# Patient Record
Sex: Male | Born: 1945 | Race: White | Hispanic: No | Marital: Married | Smoking: Former smoker
Health system: Southern US, Community
[De-identification: ages and names within clinical notes are randomized; demographics above are authoritative.]

## PROBLEM LIST (undated history)

## (undated) DIAGNOSIS — K469 Unspecified abdominal hernia without obstruction or gangrene: Secondary | ICD-10-CM

## (undated) DIAGNOSIS — E785 Hyperlipidemia, unspecified: Secondary | ICD-10-CM

## (undated) DIAGNOSIS — G4733 Obstructive sleep apnea (adult) (pediatric): Secondary | ICD-10-CM

## (undated) DIAGNOSIS — M1990 Unspecified osteoarthritis, unspecified site: Secondary | ICD-10-CM

## (undated) DIAGNOSIS — I10 Essential (primary) hypertension: Secondary | ICD-10-CM

## (undated) DIAGNOSIS — I252 Old myocardial infarction: Secondary | ICD-10-CM

## (undated) DIAGNOSIS — I2510 Atherosclerotic heart disease of native coronary artery without angina pectoris: Secondary | ICD-10-CM

## (undated) DIAGNOSIS — K219 Gastro-esophageal reflux disease without esophagitis: Secondary | ICD-10-CM

## (undated) DIAGNOSIS — J449 Chronic obstructive pulmonary disease, unspecified: Secondary | ICD-10-CM

## (undated) DIAGNOSIS — I4891 Unspecified atrial fibrillation: Secondary | ICD-10-CM

## (undated) DIAGNOSIS — Z9989 Dependence on other enabling machines and devices: Secondary | ICD-10-CM

## (undated) HISTORY — PX: CATARACT EXTRACTION: SUR2

## (undated) HISTORY — DX: Gastro-esophageal reflux disease without esophagitis: K21.9

## (undated) HISTORY — DX: Unspecified abdominal hernia without obstruction or gangrene: K46.9

## (undated) HISTORY — DX: Essential (primary) hypertension: I10

## (undated) HISTORY — DX: Hyperlipidemia, unspecified: E78.5

## (undated) HISTORY — DX: Unspecified osteoarthritis, unspecified site: M19.90

## (undated) HISTORY — PX: CARDIAC CATHETERIZATION: SHX172

---

## 1981-01-23 DIAGNOSIS — I252 Old myocardial infarction: Secondary | ICD-10-CM

## 1981-01-23 HISTORY — DX: Old myocardial infarction: I25.2

## 1991-01-24 HISTORY — PX: ANGIOPLASTY: SHX39

## 1994-01-23 HISTORY — PX: CORONARY ARTERY BYPASS GRAFT: SHX141

## 2000-05-02 ENCOUNTER — Encounter: Payer: Self-pay | Admitting: *Deleted

## 2000-05-02 ENCOUNTER — Inpatient Hospital Stay (HOSPITAL_COMMUNITY): Admission: EM | Admit: 2000-05-02 | Discharge: 2000-05-04 | Payer: Self-pay | Admitting: Emergency Medicine

## 2006-03-16 ENCOUNTER — Ambulatory Visit: Payer: Self-pay | Admitting: Gastroenterology

## 2006-03-30 ENCOUNTER — Ambulatory Visit: Payer: Self-pay | Admitting: Gastroenterology

## 2010-08-08 ENCOUNTER — Encounter (INDEPENDENT_AMBULATORY_CARE_PROVIDER_SITE_OTHER): Payer: Self-pay | Admitting: General Surgery

## 2010-08-08 ENCOUNTER — Ambulatory Visit (INDEPENDENT_AMBULATORY_CARE_PROVIDER_SITE_OTHER): Payer: Medicare Other | Admitting: General Surgery

## 2010-08-08 VITALS — BP 158/86 | HR 60 | Temp 97.5°F | Ht 74.0 in | Wt 278.0 lb

## 2010-08-08 DIAGNOSIS — K429 Umbilical hernia without obstruction or gangrene: Secondary | ICD-10-CM | POA: Insufficient documentation

## 2010-08-08 NOTE — Progress Notes (Signed)
Subjective:     Patient ID: Nicholas Lynn, male   DOB: 1945/04/02, 65 y.o.   MRN: [id number]  HPI Comments: This is a 65 year old male referred by Dr. Duanne Guess for evaluation of an umbilical hernia. He states the hernia has been there for approximately 50 years. It is asymptomatic. It does not change size. He had some trauma to the area by way of an examination bleeding to bruising. Positive for about 6 months after that but has been asymptomatic since. He has no obstructive symptoms.    Review of Systems General:  _X_Normal __Fever__Weight change __Night sweats __Fatigue __Sleep loss  Breast:  _X_Normal __Lump __Pain __Nipple discharge __Infection __Other  Infectious Diseases:  _X_Normal __HIV/AIDS __Tuberculosis __Hepatitis A       __ Hepatitis B __Hepatitis C __ STD __MRSA(staph infection) __Other  Dental:  _X_Normal __Dentures __Other  Cardiac  :__ Normal __Pacemaker __Defibrillator _X_Bypass surgery _X_High blood pressure __ Peripheral vascular disease _X_Heart attack __Irregular heart beat __Valve       disease  Pulmonary:  _X_Normal __Cough/Sputum __Bronchitis __Asthma __COPD                    __Short of breath __Pneumonia __Sleep Apnea  Endocrine:  __Normal __Diabetes __Thyroid disease _X_High Cholesterol __Other  Skin:  _X_Normal  __Rash __Bruise easily __Cancer __Abnormal moles __Other  Gastrointestinal:  _X_Normal __Nausea/Vomiting/Diarrhea __Colon polyp or Cancer       __Irritable Bowel disease __Poor appetite __Hiatal hernia or reflux __Ulcer       __Liver disease __Abdominal pain __Hernia __Rectal bleeding or hemorrhoids       __Other  Genitourinary:  _X_Normal __Kidney disease or stones __Prostate problems        __Blood in urine __Difficulty voiding __Incontinence/leakage __Other  Neurological:  _X_Normal __Stroke __Paralysis __Seizure __Alzheimers disease       __Headaches __Dizziness __Fainting __Weakness __Other  Hematologic/Lymphatic:  _X_Normal __Bleeding  disorder __Blood clots __Anemia       __Swollen lymph nodes __Blood Transfusion __Other  Immune System:  _X_Normal __Previous/Current Cancer-type:  __Other  HEENT:  __Normal _X_Hearing loss  __Hearing aid __Ear infection __Nose bleed       __Hoarseness __Sore throat __Blurred or double vision _X_Glasses or contacts       __Glaucoma __Retinopathy __Macular degeneration __Other  Musculoskeletal:  _X_Normal __Joint pain __Arthritis __Other           Objective:   Physical Exam  Constitutional: No distress.       Obese.  Cardiovascular: Normal rate and regular rhythm.   No murmur heard. Pulmonary/Chest: Breath sounds normal.       midsternal scar.  Abdominal: Soft. Bowel sounds are normal. He exhibits no distension and no mass. There is no tenderness.       Partially reducible small umbilical hernia.  Genitourinary:       No inguinal hernias.       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 It is not getting larger.    Plan:        We discussed expectant management or repair of the hernia.  He is not interested in having surgery at this time. I told him that if he started to get the least bit of pain from the area or it started getting larger he should come back so we can schedule him for surgery. He seems to understand this and agrees. I did discuss the surgery including the procedure and risks with him and his wife.

## 2011-02-13 ENCOUNTER — Encounter: Payer: Self-pay | Admitting: Gastroenterology

## 2011-02-28 ENCOUNTER — Encounter (INDEPENDENT_AMBULATORY_CARE_PROVIDER_SITE_OTHER): Payer: Self-pay | Admitting: General Surgery

## 2011-02-28 ENCOUNTER — Ambulatory Visit (INDEPENDENT_AMBULATORY_CARE_PROVIDER_SITE_OTHER): Payer: Medicare Other | Admitting: General Surgery

## 2011-02-28 VITALS — BP 146/72 | HR 70 | Temp 98.1°F | Resp 18 | Ht 74.5 in | Wt 281.4 lb

## 2011-02-28 DIAGNOSIS — K469 Unspecified abdominal hernia without obstruction or gangrene: Secondary | ICD-10-CM | POA: Insufficient documentation

## 2011-02-28 DIAGNOSIS — K429 Umbilical hernia without obstruction or gangrene: Secondary | ICD-10-CM

## 2011-02-28 NOTE — Progress Notes (Signed)
Patient ID: Nicholas Lynn, male   DOB: 1945-06-12, 66 y.o.   MRN: [id number]  Chief Complaint  Patient presents with  . Umbilical Hernia    HPI Nicholas Lynn is a 66 y.o. male.   HPI  He is a patient I saw last summer with an umbilical hernia. He wanted to wait for the repair. He now comes in wanted to schedule the repair. The hernia is not particularly painful. It is the same size. He has also asked me to locate a small lump in the left lower back that has been there for many years it is not painful nor has it grown.  Past Medical History  Diagnosis Date  . Hyperlipidemia   . Heart attack   . Heart disease   . Hypertension   . FH: CABG (coronary artery bypass surgery)   . Hernia     umbilical  . GERD (gastroesophageal reflux disease)   . Wears glasses   . Allergy   . Hearing loss   . Arthritis     Past Surgical History  Procedure Date  . Angioplasty   . Arterial bypass surgry   . Cardiac catheterization     Family History  Problem Relation Age of Onset  . Stroke Mother   . Cancer Father     lung  . Cancer Sister     lung  . Cancer Sister     liver    Social History History  Substance Use Topics  . Smoking status: Former Games developer  . Smokeless tobacco: Never Used  . Alcohol Use: No    Allergies  Allergen Reactions  . Sulfur Swelling    Causes all joints to swell. Unable to bend them.    Current Outpatient Prescriptions  Medication Sig Dispense Refill  . aspirin 81 MG EC tablet Take 81 mg by mouth daily.        . benazepril (LOTENSIN) 20 MG tablet Take 20 mg by mouth daily.        Marland Kitchen Bioflavonoid Products (ESTER C PO) Take 500 mg by mouth daily.        . cetirizine (ZYRTEC) 10 MG tablet Take 10 mg by mouth daily.        . Cholecalciferol (VITAMIN D-3 PO) Take 1,000 Units by mouth 2 (two) times daily.        . clopidogrel (PLAVIX) 75 MG tablet Take 75 mg by mouth daily.        Marland Kitchen ezetimibe-simvastatin (VYTORIN) 10-20 MG per tablet Take 1 tablet by mouth at  bedtime.        . fluticasone (VERAMYST) 27.5 MCG/SPRAY nasal spray Place 2 sprays into the nose as needed.        . metoprolol (LOPRESSOR) 50 MG tablet Take 50 mg by mouth 2 (two) times daily.        . Misc Natural Products (OSTEO BI-FLEX ADV TRIPLE ST PO) Take by mouth daily.        . montelukast (SINGULAIR) 10 MG tablet Take 10 mg by mouth at bedtime.        . Multiple Vitamins-Minerals (CENTRUM SILVER PO) Take by mouth daily.        . niacin (NIASPAN) 1000 MG CR tablet Take 1,000 mg by mouth at bedtime.        . nitroGLYCERIN (NITROSTAT) 0.4 MG SL tablet Place 0.4 mg under the tongue every 5 (five) minutes as needed.        . Omega-3 Fatty Acids (FISH  OIL PO) Take 1,000 mg by mouth 2 (two) times daily.        Marland Kitchen omeprazole (PRILOSEC) 20 MG capsule Take 20 mg by mouth daily.        . ranolazine (RANEXA) 500 MG 12 hr tablet Take 500 mg by mouth 2 (two) times daily.          Review of Systems Review of Systems  Constitutional: Negative for fever, chills and unexpected weight change.  Respiratory: Negative for chest tightness and shortness of breath.   Cardiovascular: Negative for chest pain.  Gastrointestinal: Negative for abdominal distention.  Genitourinary: Negative for difficulty urinating.    Blood pressure 146/72, pulse 70, temperature 98.1 F (36.7 C), temperature source Temporal, resp. rate 18, height 6' 2.5" (1.892 m), weight 281 lb 6.4 oz (127.642 kg).  Physical Exam Physical Exam  Constitutional: No distress.       Overweight male.  HENT:  Head: Normocephalic and atraumatic.  Cardiovascular: Normal rate and regular rhythm.   Pulmonary/Chest: Effort normal and breath sounds normal.  Abdominal: Soft. He exhibits no distension. There is no tenderness.       Umbilical bulge it is partially reducible.  Musculoskeletal: He exhibits no edema.       1 cm subcutaneous soft tissue nodule with no erythema or tenderness.    Data Reviewed Old note.  Assessment    1.  Umbilical hernia-he requests repair now.  2. Benign soft tissue mass lower back-unchanged for many years.    Plan    Umbilical hernia repair with mesh. We did not remove the soft tissue mass and this became symptomatic or became larger.  I have explained the procedure, risks, and aftercare of umbilical hernia repair.  Risks include but are not limited to bleeding, infection, wound problems, anesthesia, recurrence, intestine injury, mesh problems.  He seems to understand and agrees to proceed.       Etheline Geppert J 02/28/2011, 5:27 PM

## 2011-02-28 NOTE — Patient Instructions (Signed)
We will call you to arrange your surgery after we here from Dr. Alanda Amass.

## 2011-03-06 ENCOUNTER — Other Ambulatory Visit (INDEPENDENT_AMBULATORY_CARE_PROVIDER_SITE_OTHER): Payer: Self-pay | Admitting: General Surgery

## 2011-03-23 ENCOUNTER — Encounter (HOSPITAL_COMMUNITY): Payer: Self-pay | Admitting: Pharmacy Technician

## 2011-03-27 ENCOUNTER — Ambulatory Visit (HOSPITAL_COMMUNITY)
Admission: RE | Admit: 2011-03-27 | Discharge: 2011-03-27 | Disposition: A | Discharge status: Home / Self Care | Payer: Medicare Other | Source: Ambulatory Visit | Attending: General Surgery | Admitting: General Surgery

## 2011-03-27 ENCOUNTER — Encounter (HOSPITAL_COMMUNITY): Payer: Self-pay

## 2011-03-27 ENCOUNTER — Encounter (HOSPITAL_COMMUNITY)
Admission: RE | Admit: 2011-03-27 | Discharge: 2011-03-27 | Disposition: A | Discharge status: Home / Self Care | Payer: Medicare Other | Source: Ambulatory Visit | Attending: General Surgery | Admitting: General Surgery

## 2011-03-27 ENCOUNTER — Other Ambulatory Visit: Payer: Self-pay

## 2011-03-27 DIAGNOSIS — I10 Essential (primary) hypertension: Secondary | ICD-10-CM | POA: Insufficient documentation

## 2011-03-27 DIAGNOSIS — Z01810 Encounter for preprocedural cardiovascular examination: Secondary | ICD-10-CM | POA: Insufficient documentation

## 2011-03-27 DIAGNOSIS — K429 Umbilical hernia without obstruction or gangrene: Secondary | ICD-10-CM | POA: Insufficient documentation

## 2011-03-27 DIAGNOSIS — Z951 Presence of aortocoronary bypass graft: Secondary | ICD-10-CM | POA: Insufficient documentation

## 2011-03-27 DIAGNOSIS — Z01818 Encounter for other preprocedural examination: Secondary | ICD-10-CM | POA: Insufficient documentation

## 2011-03-27 DIAGNOSIS — Z01812 Encounter for preprocedural laboratory examination: Secondary | ICD-10-CM | POA: Insufficient documentation

## 2011-03-27 DIAGNOSIS — K219 Gastro-esophageal reflux disease without esophagitis: Secondary | ICD-10-CM | POA: Insufficient documentation

## 2011-03-27 DIAGNOSIS — I2510 Atherosclerotic heart disease of native coronary artery without angina pectoris: Secondary | ICD-10-CM | POA: Insufficient documentation

## 2011-03-27 HISTORY — DX: Atherosclerotic heart disease of native coronary artery without angina pectoris: I25.10

## 2011-03-27 LAB — DIFFERENTIAL
Basophils Absolute: 0 10*3/uL (ref 0.0–0.1)
Lymphocytes Relative: 39 % (ref 12–46)
Lymphs Abs: 2.9 10*3/uL (ref 0.7–4.0)
Monocytes Absolute: 1.3 10*3/uL — ABNORMAL HIGH (ref 0.1–1.0)
Neutro Abs: 3.1 10*3/uL (ref 1.7–7.7)

## 2011-03-27 LAB — CBC
MCH: 29.9 pg (ref 26.0–34.0)
MCHC: 34.1 g/dL (ref 30.0–36.0)
MCV: 87.6 fL (ref 78.0–100.0)
Platelets: 152 10*3/uL (ref 150–400)
RBC: 4.99 MIL/uL (ref 4.22–5.81)
RDW: 13.6 % (ref 11.5–15.5)

## 2011-03-27 LAB — COMPREHENSIVE METABOLIC PANEL
ALT: 17 U/L (ref 0–53)
AST: 16 U/L (ref 0–37)
CO2: 28 mEq/L (ref 19–32)
Calcium: 9.7 mg/dL (ref 8.4–10.5)
Creatinine, Ser: 0.83 mg/dL (ref 0.50–1.35)
GFR calc Af Amer: >90 mL/min (ref >90)
GFR calc non Af Amer: 88 mL/min — ABNORMAL LOW (ref >90)
Sodium: 140 mEq/L (ref 135–145)
Total Protein: 6.9 g/dL (ref 6.0–8.3)

## 2011-03-27 LAB — SURGICAL PCR SCREEN: MRSA, PCR: INVALID — AB

## 2011-03-27 LAB — PROTIME-INR: Prothrombin Time: 13.5 seconds (ref 11.6–15.2)

## 2011-03-27 NOTE — Patient Instructions (Signed)
20 Nicholas Lynn  03/27/2011   Your procedure is scheduled on:  04/04/11  Report to SHORT STAY DEPT  at 5:15 AM.  Call this number if you have problems the morning of surgery: [phone]   Remember:   Do not eat food or drink liquids AFTER MIDNIGHT    Take these medicines the morning of surgery with A SIP OF WATER: METOPROLOL / PRILOSEC / RANEXA   Do not wear jewelry, make-up or nail polish.  Do not wear lotions, powders, or perfumes.   Do not shave legs or underarms 48 hrs. before surgery (men may shave face)  Do not bring valuables to the hospital.  Contacts, dentures or bridgework may not be worn into surgery.  Leave suitcase in the car. After surgery it may be brought to your room.  For patients admitted to the hospital, checkout time is 11:00 AM the day of discharge.   Patients discharged the day of surgery will not be allowed to drive home.  Name and phone number of your driver:   Special Instructions:   Please read over the following fact sheets that you were given: MRSA  Information               SHOWER WITH BETASEPT THE NIGHT BEFORE SURGERY AND THE MORNING OF SURGERY                BRING C-PAP MASK

## 2011-03-30 LAB — MRSA CULTURE

## 2011-04-04 ENCOUNTER — Encounter (HOSPITAL_COMMUNITY): Payer: Self-pay | Admitting: Certified Registered Nurse Anesthetist

## 2011-04-04 ENCOUNTER — Ambulatory Visit (HOSPITAL_COMMUNITY): Payer: Medicare Other | Admitting: Certified Registered Nurse Anesthetist

## 2011-04-04 ENCOUNTER — Encounter (HOSPITAL_COMMUNITY)
Admission: RE | Disposition: A | Discharge status: Home / Self Care | Payer: Self-pay | Source: Ambulatory Visit | Attending: General Surgery

## 2011-04-04 ENCOUNTER — Ambulatory Visit (HOSPITAL_COMMUNITY)
Admission: RE | Admit: 2011-04-04 | Discharge: 2011-04-04 | Disposition: A | Discharge status: Home / Self Care | Payer: Medicare Other | Source: Ambulatory Visit | Attending: General Surgery | Admitting: General Surgery

## 2011-04-04 ENCOUNTER — Encounter (HOSPITAL_COMMUNITY): Payer: Self-pay | Admitting: *Deleted

## 2011-04-04 DIAGNOSIS — Z951 Presence of aortocoronary bypass graft: Secondary | ICD-10-CM | POA: Insufficient documentation

## 2011-04-04 DIAGNOSIS — I2510 Atherosclerotic heart disease of native coronary artery without angina pectoris: Secondary | ICD-10-CM | POA: Insufficient documentation

## 2011-04-04 DIAGNOSIS — K429 Umbilical hernia without obstruction or gangrene: Secondary | ICD-10-CM | POA: Insufficient documentation

## 2011-04-04 DIAGNOSIS — I10 Essential (primary) hypertension: Secondary | ICD-10-CM | POA: Insufficient documentation

## 2011-04-04 DIAGNOSIS — Z7982 Long term (current) use of aspirin: Secondary | ICD-10-CM | POA: Insufficient documentation

## 2011-04-04 DIAGNOSIS — Z79899 Other long term (current) drug therapy: Secondary | ICD-10-CM | POA: Insufficient documentation

## 2011-04-04 DIAGNOSIS — G4730 Sleep apnea, unspecified: Secondary | ICD-10-CM | POA: Insufficient documentation

## 2011-04-04 DIAGNOSIS — E785 Hyperlipidemia, unspecified: Secondary | ICD-10-CM | POA: Insufficient documentation

## 2011-04-04 DIAGNOSIS — K219 Gastro-esophageal reflux disease without esophagitis: Secondary | ICD-10-CM | POA: Insufficient documentation

## 2011-04-04 HISTORY — PX: UMBILICAL HERNIA REPAIR: SHX196

## 2011-04-04 SURGERY — REPAIR, HERNIA, UMBILICAL, ADULT
Anesthesia: General | Site: Abdomen | Wound class: Clean

## 2011-04-04 MED ORDER — ONDANSETRON HCL 4 MG/2ML IJ SOLN: 4.0000 mg | Freq: Four times a day (QID) | INTRAMUSCULAR | Status: DC | PRN

## 2011-04-04 MED ORDER — MIDAZOLAM HCL 5 MG/5ML IJ SOLN
INTRAMUSCULAR | Status: DC | PRN
  Administered 2011-04-04: 2 mg via INTRAVENOUS

## 2011-04-04 MED ORDER — PROMETHAZINE HCL 25 MG/ML IJ SOLN: 6.2500 mg | INTRAMUSCULAR | Status: DC | PRN

## 2011-04-04 MED ORDER — FENTANYL CITRATE 0.05 MG/ML IJ SOLN
INTRAMUSCULAR | Status: DC | PRN
  Administered 2011-04-04 (×3): 50 ug via INTRAVENOUS
  Administered 2011-04-04: 100 ug via INTRAVENOUS

## 2011-04-04 MED ORDER — ONDANSETRON HCL 4 MG/2ML IJ SOLN
INTRAMUSCULAR | Status: DC | PRN
  Administered 2011-04-04: 4 mg via INTRAVENOUS

## 2011-04-04 MED ORDER — LACTATED RINGERS IV SOLN
INTRAVENOUS | Status: DC | PRN
  Administered 2011-04-04 (×2): via INTRAVENOUS

## 2011-04-04 MED ORDER — CEFAZOLIN SODIUM-DEXTROSE 2-3 GM-% IV SOLR
INTRAVENOUS | Status: AC
  Filled 2011-04-04: qty 50

## 2011-04-04 MED ORDER — ACETAMINOPHEN 650 MG RE SUPP
650.0000 mg | RECTAL | Status: DC | PRN
  Filled 2011-04-04: qty 1

## 2011-04-04 MED ORDER — ACETAMINOPHEN 10 MG/ML IV SOLN
INTRAVENOUS | Status: DC | PRN
  Administered 2011-04-04: 1000 mg via INTRAVENOUS

## 2011-04-04 MED ORDER — LACTATED RINGERS IV SOLN: INTRAVENOUS | Status: DC

## 2011-04-04 MED ORDER — CISATRACURIUM BESYLATE 2 MG/ML IV SOLN
INTRAVENOUS | Status: DC | PRN
  Administered 2011-04-04: 6 mg via INTRAVENOUS

## 2011-04-04 MED ORDER — CEFAZOLIN SODIUM-DEXTROSE 2-3 GM-% IV SOLR
2.0000 g | INTRAVENOUS | Status: AC
  Administered 2011-04-04: 2 g via INTRAVENOUS

## 2011-04-04 MED ORDER — ACETAMINOPHEN 325 MG PO TABS: 650.0000 mg | ORAL_TABLET | ORAL | Status: DC | PRN

## 2011-04-04 MED ORDER — BUPIVACAINE-EPINEPHRINE 0.5% -1:200000 IJ SOLN
INTRAMUSCULAR | Status: DC | PRN
  Administered 2011-04-04: 15 mL

## 2011-04-04 MED ORDER — BUPIVACAINE-EPINEPHRINE (PF) 0.5% -1:200000 IJ SOLN
INTRAMUSCULAR | Status: AC
  Filled 2011-04-04: qty 10

## 2011-04-04 MED ORDER — 0.9 % SODIUM CHLORIDE (POUR BTL) OPTIME
TOPICAL | Status: DC | PRN
  Administered 2011-04-04: 1000 mL

## 2011-04-04 MED ORDER — ACETAMINOPHEN 10 MG/ML IV SOLN
INTRAVENOUS | Status: AC
  Filled 2011-04-04: qty 100

## 2011-04-04 MED ORDER — PROPOFOL 10 MG/ML IV BOLUS
INTRAVENOUS | Status: DC | PRN
  Administered 2011-04-04: 180 mg via INTRAVENOUS

## 2011-04-04 MED ORDER — ATROPINE SULFATE 0.4 MG/ML IJ SOLN
INTRAMUSCULAR | Status: DC | PRN
  Administered 2011-04-04: 0.4 mg via INTRAVENOUS

## 2011-04-04 MED ORDER — SUCCINYLCHOLINE CHLORIDE 20 MG/ML IJ SOLN
INTRAMUSCULAR | Status: DC | PRN
  Administered 2011-04-04: 100 mg via INTRAVENOUS

## 2011-04-04 MED ORDER — SODIUM CHLORIDE 0.9 % IJ SOLN: 3.0000 mL | INTRAMUSCULAR | Status: DC | PRN

## 2011-04-04 MED ORDER — LIDOCAINE HCL 1 % IJ SOLN
INTRAMUSCULAR | Status: DC | PRN
  Administered 2011-04-04: 100 mg via INTRADERMAL

## 2011-04-04 MED ORDER — FENTANYL CITRATE 0.05 MG/ML IJ SOLN: 25.0000 ug | INTRAMUSCULAR | Status: DC | PRN

## 2011-04-04 MED ORDER — OXYCODONE-ACETAMINOPHEN 5-325 MG PO TABS: 1.0000 | ORAL_TABLET | ORAL | Status: AC | PRN

## 2011-04-04 MED ORDER — OXYCODONE HCL 5 MG PO TABS: 5.0000 mg | ORAL_TABLET | ORAL | Status: DC | PRN

## 2011-04-04 MED ORDER — MORPHINE SULFATE 10 MG/ML IJ SOLN: 2.0000 mg | INTRAMUSCULAR | Status: DC | PRN

## 2011-04-04 SURGICAL SUPPLY — 41 items
APL SKNCLS STERI-STRIP NONHPOA (GAUZE/BANDAGES/DRESSINGS)
BENZOIN TINCTURE PRP APPL 2/3 (GAUZE/BANDAGES/DRESSINGS) ×1 IMPLANT
BLADE HEX COATED 2.75 (ELECTRODE) ×2 IMPLANT
BLADE SURG SZ10 CARB STEEL (BLADE) ×3 IMPLANT
CHLORAPREP W/TINT 26ML (MISCELLANEOUS) ×2 IMPLANT
CLOSURE STERI STRIP 1/2 X4 (GAUZE/BANDAGES/DRESSINGS) ×1 IMPLANT
CLOTH BEACON ORANGE TIMEOUT ST (SAFETY) ×2 IMPLANT
DECANTER SPIKE VIAL GLASS SM (MISCELLANEOUS) ×1 IMPLANT
DRAPE INCISE IOBAN 66X45 STRL (DRAPES) ×2 IMPLANT
DRAPE LAPAROTOMY T 102X78X121 (DRAPES) ×2 IMPLANT
DRSG TEGADERM 4X4.75 (GAUZE/BANDAGES/DRESSINGS) ×2 IMPLANT
ELECT REM PT RETURN 9FT ADLT (ELECTROSURGICAL) ×2
ELECTRODE REM PT RTRN 9FT ADLT (ELECTROSURGICAL) ×1 IMPLANT
GLOVE BIOGEL PI IND STRL 7.0 (GLOVE) ×1 IMPLANT
GLOVE BIOGEL PI INDICATOR 7.0 (GLOVE) ×1
GLOVE ECLIPSE 8.0 STRL XLNG CF (GLOVE) ×2 IMPLANT
GLOVE INDICATOR 8.0 STRL GRN (GLOVE) ×4 IMPLANT
GOWN STRL NON-REIN LRG LVL3 (GOWN DISPOSABLE) ×2 IMPLANT
GOWN STRL REIN XL XLG (GOWN DISPOSABLE) ×4 IMPLANT
KIT BASIN OR (CUSTOM PROCEDURE TRAY) ×2 IMPLANT
MESH HERNIA 3X6 (Mesh General) ×1 IMPLANT
NDL HYPO 25X1 1.5 SAFETY (NEEDLE) IMPLANT
NEEDLE HYPO 22GX1.5 SAFETY (NEEDLE) ×2 IMPLANT
NEEDLE HYPO 25X1 1.5 SAFETY (NEEDLE) ×2 IMPLANT
NS IRRIG 1000ML POUR BTL (IV SOLUTION) ×2 IMPLANT
PACK BASIC VI WITH GOWN DISP (CUSTOM PROCEDURE TRAY) ×2 IMPLANT
PENCIL BUTTON HOLSTER BLD 10FT (ELECTRODE) ×2 IMPLANT
SOL PREP POV-IOD 16OZ 10% (MISCELLANEOUS) ×1 IMPLANT
SPONGE GAUZE 4X4 12PLY (GAUZE/BANDAGES/DRESSINGS) ×1 IMPLANT
SPONGE GAUZE 4X4 STERILE 39 (GAUZE/BANDAGES/DRESSINGS) ×1 IMPLANT
SPONGE LAP 4X18 X RAY DECT (DISPOSABLE) ×2 IMPLANT
STRIP CLOSURE SKIN 1/2X4 (GAUZE/BANDAGES/DRESSINGS) ×2 IMPLANT
SUT MNCRL AB 4-0 PS2 18 (SUTURE) ×2 IMPLANT
SUT PROLENE 0 CT 2 (SUTURE) ×1 IMPLANT
SUT SURG 0 T 19/GS 22 1969 62 (SUTURE) ×2 IMPLANT
SUT VIC AB 3-0 SH 27 (SUTURE) ×2
SUT VIC AB 3-0 SH 27XBRD (SUTURE) ×1 IMPLANT
SYR BULB IRRIGATION 50ML (SYRINGE) ×2 IMPLANT
SYR CONTROL 10ML LL (SYRINGE) ×2 IMPLANT
TOWEL OR 17X26 10 PK STRL BLUE (TOWEL DISPOSABLE) ×2 IMPLANT
YANKAUER SUCT BULB TIP 10FT TU (MISCELLANEOUS) ×2 IMPLANT

## 2011-04-04 NOTE — Anesthesia Preprocedure Evaluation (Addendum)
Anesthesia Evaluation  Patient identified by MRN, date of birth, ID band Patient awake    Reviewed: Allergy & Precautions, H&P , NPO status , Patient's Chart, lab work & pertinent test results  Airway Mallampati: II TM Distance: >3 FB Neck ROM: Full    Dental No notable dental hx.    Pulmonary neg pulmonary ROS,  breath sounds clear to auscultation  Pulmonary exam normal       Cardiovascular hypertension, + CAD and + Past MI Rhythm:Regular Rate:Normal     Neuro/Psych negative neurological ROS  negative psych ROS   GI/Hepatic Neg liver ROS, GERD-  ,  Endo/Other  negative endocrine ROS  Renal/GU negative Renal ROS  negative genitourinary   Musculoskeletal negative musculoskeletal ROS (+)   Abdominal   Peds negative pediatric ROS (+)  Hematology negative hematology ROS (+)   Anesthesia Other Findings   Reproductive/Obstetrics negative OB ROS                          Anesthesia Physical Anesthesia Plan  ASA: III  Anesthesia Plan: General   Post-op Pain Management:    Induction: Intravenous  Airway Management Planned: LMA and Oral ETT  Additional Equipment:   Intra-op Plan:   Post-operative Plan: Extubation in OR  Informed Consent: I have reviewed the patients History and Physical, chart, labs and discussed the procedure including the risks, benefits and alternatives for the proposed anesthesia with the patient or authorized representative who has indicated his/her understanding and acceptance.   Dental advisory given  Plan Discussed with: CRNA  Anesthesia Plan Comments:         Anesthesia Quick Evaluation

## 2011-04-04 NOTE — Progress Notes (Signed)
Ambulated in hallway tolerated activity well.

## 2011-04-04 NOTE — Anesthesia Postprocedure Evaluation (Signed)
  Anesthesia Post-op Note  Patient: Nicholas Lynn  Procedure(s) Performed: Procedure(s) (LRB): HERNIA REPAIR UMBILICAL ADULT (N/A) INSERTION OF MESH (N/A)  Patient Location: PACU  Anesthesia Type: General  Level of Consciousness: awake and alert   Airway and Oxygen Therapy: Patient Spontanous Breathing  Post-op Pain: mild  Post-op Assessment: Post-op Vital signs reviewed, Patient's Cardiovascular Status Stable, Respiratory Function Stable, Patent Airway and No signs of Nausea or vomiting  Post-op Vital Signs: stable  Complications: No apparent anesthesia complications

## 2011-04-04 NOTE — H&P (Signed)
Nicholas Lynn is an 66 y.o. male.   Chief Complaint:   Umbilical hernia HPI:   67 years old male with an umbilical hernia here for elective repair.  Past Medical History  Diagnosis Date  . Hyperlipidemia   . Heart disease   . Hypertension   . FH: CABG (coronary artery bypass surgery)   . Hernia     umbilical  . GERD (gastroesophageal reflux disease)   . Wears glasses   . Allergy   . Hearing loss   . Arthritis   . Heart attack 1988  . Coronary artery disease   . Sleep apnea     USES C-PAP  . Dysrhythmia     OCCASIONAL PALPITATION    Past Surgical History  Procedure Date  . Angioplasty   . Arterial bypass surgry   . Cardiac catheterization   . Coronary artery bypass graft 1996    3 VESSELS  . Cataract extraction     Family History  Problem Relation Age of Onset  . Stroke Mother   . Cancer Father     lung  . Cancer Sister     lung  . Cancer Sister     liver   Social History:  reports that he quit smoking about 25 years ago. He has never used smokeless tobacco. He reports that he does not drink alcohol or use illicit drugs.  Allergies:  Allergies  Allergen Reactions  . Sulfur Swelling    Causes all joints to swell. Unable to bend them.    Medications Prior to Admission  Medication Dose Route Frequency Provider Last Rate Last Dose  . ceFAZolin (ANCEF) IVPB 2 g/50 mL premix  2 g Intravenous 60 min Pre-Op Adolph Pollack, MD       Medications Prior to Admission  Medication Sig Dispense Refill  . aspirin 81 MG EC tablet Take 162 mg by mouth daily before breakfast.       . benazepril (LOTENSIN) 20 MG tablet Take 20 mg by mouth at bedtime.       Marland Kitchen Bioflavonoid Products (ESTER C PO) Take 500 mg by mouth daily.        . cetirizine (ZYRTEC) 10 MG tablet Take 10 mg by mouth daily.        . Cholecalciferol (VITAMIN D-3 PO) Take 1,000 Units by mouth 2 (two) times daily.        Marland Kitchen ezetimibe-simvastatin (VYTORIN) 10-20 MG per tablet Take 1 tablet by mouth at bedtime.        . fluticasone (VERAMYST) 27.5 MCG/SPRAY nasal spray Place 2 sprays into the nose as needed. Allergies       . metoprolol (LOPRESSOR) 50 MG tablet Take 50 mg by mouth 2 (two) times daily.       . Misc Natural Products (OSTEO BI-FLEX ADV TRIPLE ST PO) Take 1 tablet by mouth daily.       . montelukast (SINGULAIR) 10 MG tablet Take 10 mg by mouth at bedtime.       . Multiple Vitamins-Minerals (CENTRUM SILVER PO) Take 1 tablet by mouth daily.       . niacin (NIASPAN) 1000 MG CR tablet Take 1,000 mg by mouth at bedtime.        . Omega-3 Fatty Acids (FISH OIL PO) Take 1,000 mg by mouth 2 (two) times daily.        Marland Kitchen omeprazole (PRILOSEC) 20 MG capsule Take 20 mg by mouth daily.       . ranolazine (  RANEXA) 500 MG 12 hr tablet Take 500 mg by mouth 2 (two) times daily.       . clopidogrel (PLAVIX) 75 MG tablet Take 75 mg by mouth daily before breakfast.       . nitroGLYCERIN (NITROSTAT) 0.4 MG SL tablet Place 0.4 mg under the tongue every 5 (five) minutes as needed. Chest pain         No results found for this or any previous visit (from the past 48 hour(s)). No results found.  Review of Systems  Constitutional: Negative for fever and chills.    Blood pressure 145/73, pulse 51, temperature 98.7 F (37.1 C), resp. rate 20, SpO2 96.00%. Physical Exam  Constitutional:       Overweight.  NAD.  HENT:  Head: Normocephalic and atraumatic.  Eyes: No scleral icterus.  GI: He exhibits no distension and no mass. There is no tenderness.       Umbilical hernia is present.  Skin: Skin is warm and dry.  Psychiatric: He has a normal mood and affect. His behavior is normal.     Assessment/Plan Umbilical hernia  Plan:  Umbilical hernia repair with mesh.  Kennah Hehr J 04/04/2011, 7:46 AM

## 2011-04-04 NOTE — Op Note (Signed)
Operative Note  Nicholas Lynn male 66 y.o. 04/04/2011  PREOPERATIVE DX:  Umbilical hernia  POSTOPERATIVE DX:  Same  PROCEDURE:  Umbilical hernia repair with mesh        Surgeon: Adolph Pollack   Assistants: None  Anesthesia: General endotracheal anesthesia with local (marcaine)  Indications: This is a 66 year old male with an umbilical hernia who now presents for elective repair. The procedure, risks, and aftercare were discussed with him preoperatively.    Procedure Detail:  He was seen in the holding area and brought to the operating room placed supine on the operating table and a general anesthetic was administered.  The hair in the periumbilical area was clipped. This area was then sterilely prepped and draped.  Marcaine solution was infiltrated superficially and deep in the periumbilical area for local anesthetic effect. A curvilinear subumbilical incision was made through the skin and subcutaneous tissue until the fascia was identified. Subcutaneous tissue was dissected free from the umbilical stalk and the hernia was directly adjacent to this. The umbilicus was then dissected free from the fascia exposing the hernia measured about 2 cm. Subcutaneous tissue was dissected free from the fascia for a distance of 3-4 cm around the hernia. The hernia was then primarily closed with interrupted 0 Surgilon sutures after the extraperitoneal fat coming out of the hernia had been reduced. There was no tension.  A piece of polypropylene mesh was then brought into the field and it was cut to allow for 3-4 cm of overlap around the primary repair.  The primary repair sutures were threaded up through the mesh and tied down anchoring the mesh directly over the primary repair. The periphery of the mesh was then anchored to the fascia with a running 0 Prolene suture. This provided for good coverage with good overlap.  Local anesthetic was infiltrated into the fascia. The wound was irrigated and  hemostasis was adequate. The umbilicus was implanted on the mesh with 3-0 Vicryl suture. The subcutaneous tissue was closed with running 3-0 Vicryl suture. The skin was closed with a 4-0 Monocryl subcuticular stitch. Steri-Strips and sterile dressings were applied.  He tolerated the procedure well without any apparent complications and was taken to the recovery room in satisfactory condition.    Estimated Blood Loss:  Minimal         Drains: none          Blood Given: none          Specimens: None        Complications:  * No complications entered in OR log *         Disposition: PACU - hemodynamically stable.         Condition: stable

## 2011-04-04 NOTE — Anesthesia Procedure Notes (Signed)
Procedure Name: Intubation Date/Time: 04/04/2011 7:59 AM Performed by: Hulan Fess Pre-anesthesia Checklist: Patient identified, Emergency Drugs available, Suction available and Timeout performed Patient Re-evaluated:Patient Re-evaluated prior to inductionOxygen Delivery Method: Circle system utilized Preoxygenation: Pre-oxygenation with 100% oxygen Intubation Type: IV induction Ventilation: Mask ventilation without difficulty Laryngoscope Size: Mac and 3 Grade View: Grade I Tube type: Oral Tube size: 8.0 mm Placement Confirmation: ETT inserted through vocal cords under direct vision and breath sounds checked- equal and bilateral Secured at: 22 cm Tube secured with: Tape

## 2011-04-04 NOTE — Transfer of Care (Signed)
Immediate Anesthesia Transfer of Care Note  Patient: Nicholas Lynn  Procedure(s) Performed: Procedure(s) (LRB): HERNIA REPAIR UMBILICAL ADULT (N/A) INSERTION OF MESH (N/A)  Patient Location: PACU  Anesthesia Type: General  Level of Consciousness: awake, alert  and oriented  Airway & Oxygen Therapy: Patient Spontanous Breathing and Patient connected to face mask oxygen  Post-op Assessment: Report given to PACU RN  Post vital signs: Reviewed and stable  Complications: No apparent anesthesia complications

## 2011-04-05 ENCOUNTER — Telehealth (INDEPENDENT_AMBULATORY_CARE_PROVIDER_SITE_OTHER): Payer: Self-pay | Admitting: General Surgery

## 2011-04-19 ENCOUNTER — Encounter (HOSPITAL_COMMUNITY): Payer: Self-pay | Admitting: General Surgery

## 2011-05-05 ENCOUNTER — Encounter (INDEPENDENT_AMBULATORY_CARE_PROVIDER_SITE_OTHER): Payer: Self-pay | Admitting: General Surgery

## 2011-05-05 ENCOUNTER — Ambulatory Visit (INDEPENDENT_AMBULATORY_CARE_PROVIDER_SITE_OTHER): Payer: Medicare Other | Admitting: General Surgery

## 2011-05-05 VITALS — BP 168/82 | HR 60 | Temp 97.4°F | Resp 16 | Ht 74.0 in | Wt 274.8 lb

## 2011-05-05 DIAGNOSIS — Z9889 Other specified postprocedural states: Secondary | ICD-10-CM

## 2011-05-05 NOTE — Progress Notes (Signed)
He/she presents for postop followup after open umbilical hernia repair with mesh.  Post op pain is almost gone.  No difficulty voiding or having BMs.  Swelling is decreasing.  P.E.  ABD:  Soft, incision clean/dry/intact, hernia repair is solid   Assessment:  Doing well post umbilical hernia repair.  Plan:  Continue light activities for 6 weeks postop then slowly start to resume normal activities.  Avoid strenous abdominal exercises for a total of 8 weeks from surgery.  Avoid activities that cause significant discomfort for the long term.  Return visit as needed.

## 2011-05-05 NOTE — Patient Instructions (Signed)
You may resume your normal activities, as discussed, in 2 weeks.

## 2011-08-31 ENCOUNTER — Other Ambulatory Visit: Payer: Self-pay | Admitting: Cardiology

## 2011-08-31 ENCOUNTER — Encounter (HOSPITAL_COMMUNITY): Payer: Self-pay | Admitting: Pharmacy Technician

## 2011-09-01 ENCOUNTER — Encounter (HOSPITAL_COMMUNITY)
Admission: RE | Disposition: A | Discharge status: Home / Self Care | Payer: Self-pay | Source: Ambulatory Visit | Attending: Cardiology

## 2011-09-01 ENCOUNTER — Observation Stay (HOSPITAL_COMMUNITY)
Admission: RE | Admit: 2011-09-01 | Discharge: 2011-09-01 | Disposition: A | Discharge status: Home / Self Care | Payer: Medicare Other | Source: Ambulatory Visit | Attending: Cardiology | Admitting: Cardiology

## 2011-09-01 ENCOUNTER — Encounter (HOSPITAL_COMMUNITY): Payer: Self-pay | Admitting: General Practice

## 2011-09-01 DIAGNOSIS — G4733 Obstructive sleep apnea (adult) (pediatric): Secondary | ICD-10-CM | POA: Diagnosis present

## 2011-09-01 DIAGNOSIS — I2089 Other forms of angina pectoris: Secondary | ICD-10-CM | POA: Diagnosis present

## 2011-09-01 DIAGNOSIS — I208 Other forms of angina pectoris: Secondary | ICD-10-CM | POA: Diagnosis present

## 2011-09-01 DIAGNOSIS — R931 Abnormal findings on diagnostic imaging of heart and coronary circulation: Secondary | ICD-10-CM | POA: Diagnosis present

## 2011-09-01 DIAGNOSIS — I2510 Atherosclerotic heart disease of native coronary artery without angina pectoris: Secondary | ICD-10-CM | POA: Diagnosis present

## 2011-09-01 DIAGNOSIS — K219 Gastro-esophageal reflux disease without esophagitis: Secondary | ICD-10-CM | POA: Insufficient documentation

## 2011-09-01 DIAGNOSIS — R9439 Abnormal result of other cardiovascular function study: Principal | ICD-10-CM | POA: Insufficient documentation

## 2011-09-01 DIAGNOSIS — I10 Essential (primary) hypertension: Secondary | ICD-10-CM | POA: Diagnosis present

## 2011-09-01 DIAGNOSIS — I2582 Chronic total occlusion of coronary artery: Secondary | ICD-10-CM | POA: Insufficient documentation

## 2011-09-01 DIAGNOSIS — E785 Hyperlipidemia, unspecified: Secondary | ICD-10-CM | POA: Diagnosis present

## 2011-09-01 DIAGNOSIS — I25810 Atherosclerosis of coronary artery bypass graft(s) without angina pectoris: Secondary | ICD-10-CM | POA: Insufficient documentation

## 2011-09-01 DIAGNOSIS — R001 Bradycardia, unspecified: Secondary | ICD-10-CM | POA: Diagnosis not present

## 2011-09-01 HISTORY — PX: GRAFT(S) ANGIOGRAM: SHX5479

## 2011-09-01 HISTORY — PX: LEFT HEART CATHETERIZATION WITH CORONARY ANGIOGRAM: SHX5451

## 2011-09-01 SURGERY — LEFT HEART CATHETERIZATION WITH CORONARY ANGIOGRAM
Anesthesia: LOCAL

## 2011-09-01 MED ORDER — FLUTICASONE FUROATE 27.5 MCG/SPRAY NA SUSP: 2.0000 | NASAL | Status: DC | PRN

## 2011-09-01 MED ORDER — SODIUM CHLORIDE 0.9 % IJ SOLN: 3.0000 mL | INTRAMUSCULAR | Status: DC | PRN

## 2011-09-01 MED ORDER — NITROGLYCERIN 0.2 MG/ML ON CALL CATH LAB
INTRAVENOUS | Status: AC
  Filled 2011-09-01: qty 1

## 2011-09-01 MED ORDER — NIACIN ER 500 MG PO CPCR
1000.0000 mg | ORAL_CAPSULE | Freq: Every day | ORAL | Status: DC
  Filled 2011-09-01: qty 2

## 2011-09-01 MED ORDER — ALPRAZOLAM 0.25 MG PO TABS: 0.2500 mg | ORAL_TABLET | Freq: Three times a day (TID) | ORAL | Status: DC | PRN

## 2011-09-01 MED ORDER — ZOLPIDEM TARTRATE 5 MG PO TABS: 5.0000 mg | ORAL_TABLET | Freq: Every evening | ORAL | Status: DC | PRN

## 2011-09-01 MED ORDER — MONTELUKAST SODIUM 10 MG PO TABS
10.0000 mg | ORAL_TABLET | Freq: Every day | ORAL | Status: DC
  Filled 2011-09-01: qty 1

## 2011-09-01 MED ORDER — BENAZEPRIL HCL 20 MG PO TABS
20.0000 mg | ORAL_TABLET | Freq: Every day | ORAL | Status: DC
  Filled 2011-09-01: qty 1

## 2011-09-01 MED ORDER — LIDOCAINE HCL (PF) 1 % IJ SOLN
INTRAMUSCULAR | Status: AC
  Filled 2011-09-01: qty 30

## 2011-09-01 MED ORDER — NIACIN ER (ANTIHYPERLIPIDEMIC) 1000 MG PO TBCR: 1000.0000 mg | EXTENDED_RELEASE_TABLET | Freq: Every day | ORAL | Status: DC

## 2011-09-01 MED ORDER — ACETAMINOPHEN 325 MG PO TABS: 650.0000 mg | ORAL_TABLET | ORAL | Status: DC | PRN

## 2011-09-01 MED ORDER — HEPARIN (PORCINE) IN NACL 2-0.9 UNIT/ML-% IJ SOLN
INTRAMUSCULAR | Status: AC
  Filled 2011-09-01: qty 2000

## 2011-09-01 MED ORDER — FLUTICASONE PROPIONATE 50 MCG/ACT NA SUSP
2.0000 | NASAL | Status: DC | PRN
  Filled 2011-09-01: qty 16

## 2011-09-01 MED ORDER — RANOLAZINE ER 500 MG PO TB12: 1000.0000 mg | ORAL_TABLET | Freq: Two times a day (BID) | ORAL | Status: DC

## 2011-09-01 MED ORDER — NITROGLYCERIN 0.4 MG SL SUBL: 0.4000 mg | SUBLINGUAL_TABLET | SUBLINGUAL | Status: DC | PRN

## 2011-09-01 MED ORDER — SODIUM CHLORIDE 0.9 % IV SOLN
INTRAVENOUS | Status: DC
  Administered 2011-09-01: 09:00:00 via INTRAVENOUS

## 2011-09-01 MED ORDER — MIDAZOLAM HCL 2 MG/2ML IJ SOLN
INTRAMUSCULAR | Status: AC
  Filled 2011-09-01: qty 2

## 2011-09-01 MED ORDER — ZOLPIDEM TARTRATE 5 MG PO TABS: 10.0000 mg | ORAL_TABLET | Freq: Every evening | ORAL | Status: DC | PRN

## 2011-09-01 MED ORDER — FENTANYL CITRATE 0.05 MG/ML IJ SOLN
INTRAMUSCULAR | Status: AC
  Filled 2011-09-01: qty 2

## 2011-09-01 MED ORDER — TRAMADOL HCL 50 MG PO TABS: 50.0000 mg | ORAL_TABLET | Freq: Four times a day (QID) | ORAL | Status: DC | PRN

## 2011-09-01 MED ORDER — ASPIRIN EC 325 MG PO TBEC: 325.0000 mg | DELAYED_RELEASE_TABLET | Freq: Every day | ORAL | Status: DC

## 2011-09-01 MED ORDER — SODIUM CHLORIDE 0.9 % IV SOLN: 1.0000 mL/kg/h | INTRAVENOUS | Status: AC

## 2011-09-01 MED ORDER — RANOLAZINE ER 500 MG PO TB12
1000.0000 mg | ORAL_TABLET | Freq: Two times a day (BID) | ORAL | Status: DC
  Filled 2011-09-01: qty 2

## 2011-09-01 MED ORDER — LORATADINE 10 MG PO TABS: 10.0000 mg | ORAL_TABLET | Freq: Every day | ORAL | Status: DC

## 2011-09-01 MED ORDER — SODIUM CHLORIDE 0.9 % IV SOLN: 250.0000 mL | INTRAVENOUS | Status: DC

## 2011-09-01 MED ORDER — ASPIRIN EC 81 MG PO TBEC: 162.0000 mg | DELAYED_RELEASE_TABLET | Freq: Every day | ORAL | Status: DC

## 2011-09-01 MED ORDER — SODIUM CHLORIDE 0.9 % IJ SOLN: 3.0000 mL | Freq: Two times a day (BID) | INTRAMUSCULAR | Status: DC

## 2011-09-01 MED ORDER — OMEGA-3-ACID ETHYL ESTERS 1 G PO CAPS
2.0000 g | ORAL_CAPSULE | Freq: Every day | ORAL | Status: DC
  Filled 2011-09-01: qty 2

## 2011-09-01 MED ORDER — PANTOPRAZOLE SODIUM 40 MG PO TBEC: 40.0000 mg | DELAYED_RELEASE_TABLET | Freq: Every day | ORAL | Status: DC

## 2011-09-01 MED ORDER — DIAZEPAM 5 MG PO TABS
5.0000 mg | ORAL_TABLET | ORAL | Status: AC
  Administered 2011-09-01: 5 mg via ORAL
  Filled 2011-09-01: qty 1

## 2011-09-01 MED ORDER — ONDANSETRON HCL 4 MG/2ML IJ SOLN: 4.0000 mg | Freq: Four times a day (QID) | INTRAMUSCULAR | Status: DC | PRN

## 2011-09-01 MED ORDER — EZETIMIBE-SIMVASTATIN 10-20 MG PO TABS
1.0000 | ORAL_TABLET | Freq: Every day | ORAL | Status: DC
  Filled 2011-09-01: qty 1

## 2011-09-01 MED ORDER — METOPROLOL TARTRATE 50 MG PO TABS
50.0000 mg | ORAL_TABLET | Freq: Two times a day (BID) | ORAL | Status: DC
  Filled 2011-09-01: qty 1

## 2011-09-01 MED ORDER — MORPHINE SULFATE 2 MG/ML IJ SOLN: 2.0000 mg | INTRAMUSCULAR | Status: DC | PRN

## 2011-09-01 NOTE — H&P (Signed)
History and Physical Interval Note:  NAME:  Nicholas Lynn   MRN: [id number] DOB:  Jun 24, 1945   ADMIT DATE: 09/01/2011   09/01/2011 8:33 AM  Nicholas Lynn is a 66 y.o. male who is a long-standing patient of Dr. Alanda Amass - PMH of CAD-CABG [phone] (Cath in 10/8 - LIMA-LAD, SVG-OM patent; occluded SVG-RCA with L-R collaterals, 50% SVG-OM lesion; EF 40-45%) He tolerated recent Umbilical Hernia repair in 3/13, but after having his BB dose decreased due to bradycardia in July 2013, he began to note typical exertional angina relieved by rest.  He was evaluated with a Myoview ST on 08/30/11 with that demonstrated High -Risk ischemia in the LCx distribution that would be worrisome for jeopardized L-R collaterals. He was seen by Dr. Alanda Amass on 08/31/11 and is now referred for cardiac catheterization.  Please see the clinic notes in the shadow chart for more details.   Past Medical History  Diagnosis Date  . Hyperlipidemia   . Heart disease   . Hypertension   . FH: CABG (coronary artery bypass surgery)   . Hernia     umbilical  . GERD (gastroesophageal reflux disease)   . Wears glasses   . Allergy   . Hearing loss   . Arthritis   . Heart attack 1988  . Coronary artery disease   . Sleep apnea     USES C-PAP  . Dysrhythmia     OCCASIONAL PALPITATION   Past Surgical History  Procedure Date  . Angioplasty   . Arterial bypass surgry   . Cardiac catheterization   . Coronary artery bypass graft 1996    3 VESSELS  . Cataract extraction   . Umbilical hernia repair 04/04/2011    Procedure: HERNIA REPAIR UMBILICAL ADULT;  Surgeon: Adolph Pollack, MD;  Location: [facility];  Service: General;  Laterality: N/A;  Umbilical Hernia Repair    FAMHx: Family History  Problem Relation Age of Onset  . Stroke Mother   . Cancer Father     lung  . Cancer Sister     lung  . Cancer Sister     liver    SOCHx:  reports that he quit smoking about 25 years ago. He has never used smokeless tobacco. He  reports that he does not drink alcohol or use illicit drugs.  ALLERGIES: Allergies  Allergen Reactions  . Sulfur Swelling    Causes all joints to swell. Unable to bend them.    HOME MEDICATIONS: Prescriptions prior to admission  Medication Sig Dispense Refill  . aspirin EC 81 MG tablet Take 162 mg by mouth daily.      . benazepril (LOTENSIN) 20 MG tablet Take 20 mg by mouth at bedtime.       Marland Kitchen Bioflavonoid Products (ESTER C PO) Take 500 mg by mouth daily.        . cetirizine (ZYRTEC) 10 MG tablet Take 10 mg by mouth daily.        . Cholecalciferol (VITAMIN D-3 PO) Take 1,000 Units by mouth 2 (two) times daily.        . clopidogrel (PLAVIX) 75 MG tablet Take 75 mg by mouth daily before breakfast.       . ezetimibe-simvastatin (VYTORIN) 10-20 MG per tablet Take 1 tablet by mouth at bedtime.       . fluticasone (VERAMYST) 27.5 MCG/SPRAY nasal spray Place 2 sprays into the nose as needed. Allergies       . metoprolol (LOPRESSOR) 50 MG tablet Take 50  mg by mouth 2 (two) times daily.       . Misc Natural Products (OSTEO BI-FLEX ADV TRIPLE ST PO) Take 1 tablet by mouth daily.       . montelukast (SINGULAIR) 10 MG tablet Take 10 mg by mouth at bedtime.       . Multiple Vitamin (MULTIVITAMIN WITH MINERALS) TABS Take 1 tablet by mouth daily.      . niacin (NIASPAN) 1000 MG CR tablet Take 1,000 mg by mouth at bedtime.        . nitroGLYCERIN (NITROSTAT) 0.4 MG SL tablet Place 0.4 mg under the tongue every 5 (five) minutes as needed. Chest pain       . Omega-3 Fatty Acids (FISH OIL PO) Take 1,000 mg by mouth 2 (two) times daily.        Marland Kitchen omeprazole (PRILOSEC) 20 MG capsule Take 20 mg by mouth daily.       . ranolazine (RANEXA) 500 MG 12 hr tablet Take 500 mg by mouth 2 (two) times daily.         IMPRESSION & PLAN The patients' history has been reviewed, patient examined, no change in status from most recent note, stable for surgery. I have reviewed the patients' chart and labs. Questions were  answered to the patient's satisfaction.    Nicholas Lynn has presented today for cardiac catheterization, with the diagnosis of Exertional Angina.  The various methods of treatment have been discussed with the patient and family.   Risks / Complications include, but not limited to: Death, MI, CVA/TIA, VF/VT (with defibrillation), Bradycardia (need for temporary pacer placement), contrast induced nephropathy, bleeding / bruising / hematoma / pseudoaneurysm, vascular or coronary injury (with possible emergent CT or Vascular Surgery), adverse medication reactions, infection.     After consideration of risks, benefits and other options for treatment, the patient has consented to Procedure(s):  LEFT HEART CATHETERIZATION AND CORONARY AND GRAFT ANGIOGRAPHY +/- AD HOC PERCUTANEOUS CORONARY INTERVENTION  as a surgical intervention.   We will proceed with the planned procedure.   HARDING,DAVID W [facility] [address] [place], [place]  [address]  [phone]  09/01/2011 8:33 AM

## 2011-09-01 NOTE — Progress Notes (Signed)
Patient tolerated bedrest post cath ambulated hall after bedrest denies pain. Right groin level 0. Plan for discharge home instructions to give family at bedside. No complications at this time.

## 2011-09-01 NOTE — Discharge Summary (Signed)
Patient ID: Nicholas Lynn,  MRN: [id number], DOB/AGE: 10/04/1945 66 y.o.  Admit date: 09/01/2011 Discharge date: 09/01/2011  Primary Care Provider:  Primary Cardiologist: Dr Alanda Amass  Discharge Diagnoses Principal Problem:  *Exertional angina Active Problems:  Abnormal nuclear cardiac imaging test  CAD (coronary artery disease), CABG '96, occl SVG-RCA '08, medical Rx  Sleep apnea  Bradycardia, beta blocker decreased 3/13  HTN (hypertension)  Dyslipidemia    Procedures: Cath   Hospital Course: Nicholas Lynn is a 66 y.o. male who is a long-standing patient of Dr. Alanda Amass - PMH of CAD-CABG [phone] (Cath in 10/8 - LIMA-LAD, SVG-OM patent; occluded SVG-RCA with L-R collaterals, 50% SVG-OM lesion; EF 40-45%)  He tolerated recent Umbilical Hernia repair in 3/13, but after having his BB dose decreased due to bradycardia in July 2013, he began to note typical exertional angina relieved by rest. He was evaluated with a Myoview ST on 08/30/11 with that demonstrated High -Risk ischemia in the LCx distribution that would be worrisome for jeopardized L-R collaterals.  He was seen by Dr. Alanda Amass on 08/31/11 and is now referred for cardiac catheterization. Please see the clinic notes in the shadow chart for more details. Cath was done by Dr Herbie Baltimore 09/01/11 and showed progression of CAD and moderate LVD. He is referred to Dr Tyrone Sage for consideration of re do CABG.   Discharge Vitals:  Blood pressure 125/65, pulse 51, temperature 98 F (36.7 C), temperature source Oral, resp. rate 19, height 6\' 2"  (1.88 m), weight 122.925 kg (271 lb), SpO2 95.00%.    Labs: No results found for this or any previous visit (from the past 48 hour(s)).  Disposition:  Follow-up Information    Follow up with GERHARDT,EDWARD B, MD. (office will call you Monday)    Contact information:   [address] [place] [address] [phone]          Discharge Medications:  Medication List   As of 09/01/2011  4:13 PM   STOP taking these medications         clopidogrel 75 MG tablet         TAKE these medications         acetaminophen 325 MG tablet   Commonly known as: TYLENOL   Take 2 tablets (650 mg total) by mouth every 4 (four) hours as needed.      aspirin EC 325 MG tablet   Take 1 tablet (325 mg total) by mouth daily.      benazepril 20 MG tablet   Commonly known as: LOTENSIN   Take 20 mg by mouth at bedtime.      cetirizine 10 MG tablet   Commonly known as: ZYRTEC   Take 10 mg by mouth daily.      ESTER C PO   Take 500 mg by mouth daily.      ezetimibe-simvastatin 10-20 MG per tablet   Commonly known as: VYTORIN   Take 1 tablet by mouth at bedtime.      FISH OIL PO   Take 1,000 mg by mouth 2 (two) times daily.      fluticasone 27.5 MCG/SPRAY nasal spray   Commonly known as: VERAMYST   Place 2 sprays into the nose as needed. Allergies        metoprolol 50 MG tablet   Commonly known as: LOPRESSOR   Take 50 mg by mouth 2 (two) times daily.      montelukast 10 MG tablet   Commonly known  as: SINGULAIR   Take 10 mg by mouth at bedtime.      multivitamin with minerals Tabs   Take 1 tablet by mouth daily.      niacin 1000 MG CR tablet   Commonly known as: NIASPAN   Take 1,000 mg by mouth at bedtime.      nitroGLYCERIN 0.4 MG SL tablet   Commonly known as: NITROSTAT   Place 0.4 mg under the tongue every 5 (five) minutes as needed. Chest pain        omeprazole 20 MG capsule   Commonly known as: PRILOSEC   Take 20 mg by mouth daily.      OSTEO BI-FLEX ADV TRIPLE ST PO   Take 1 tablet by mouth daily.      ranolazine 500 MG 12 hr tablet   Commonly known as: RANEXA   Take 500 mg by mouth 2 (two) times daily.      VITAMIN D-3 PO   Take 1,000 Units by mouth 2 (two) times daily.           Duration of Discharge Encounter: Greater than 30 minutes including physician time.  Signed, Corine Shelter PA-C 09/01/2011 4:13 PM  The initial plan  was short term Observation to allow time for CT Surgery to evaluate the patient for possible re-do CABG.  Unfortunately, Dr. Tyrone Sage will not be available to see him tonite/today.  As he has not been having resting angina, he should be safe for d/c home to f/u with Dr. Tyrone Sage next week to discuss re-do CABG.    I will increase the Ranexa dose & keep metoprolol at 50mg  bid.  I have instructed the patient to minimize activity until his appt. If he has recurrence of prolonged Angina or Rest Angina, he is to return for admission to determine re-do CABG vs. High RISK PCI.  Marykay Lex, M.D., M.S. [facility] [address] [place], [place]  [address]  [phone] Pager # [phone]  09/01/2011 4:40 PM

## 2011-09-01 NOTE — CV Procedure (Signed)
[facility]    CARDIAC CATHETERIZATION REPORT  Nicholas Lynn   MRN: [id number] 1945/09/28   ADMIT DATE:  09/01/2011  Performing Cardiologist: Marykay Lex Primary Physician: Maryelizabeth Rowan, MD Primary Cardiologist:  Pearletha Furl. Alanda Amass, MD  [facility].  Procedures Performed:  Left Heart Catheterization via 5 Fr Right Common Femoral Artery access  Left Ventriculography, (RAO) 12 ml/sec for 25 ml total contrast  Native Coronary Angiography  Internal Mammary Graft Angiography  Saphenous Vein Graft Angiography  Indication(s): Abnormal Cardiac Functional Study -- High Risk Lexiscan Myoview, Inferolateral Ischemia.  CAD- h/o CABG, known occluded SVG-RCA.  New onset exertional Angina  History: 66 y.o. male  Consent: The procedure with Risks/Benefits/Alternatives and Indications was reviewed with the patient (and family).  All questions were answered.    Risks / Complications include, but not limited to: Death, MI, CVA/TIA, VF/VT (with defibrillation), Bradycardia (need for temporary pacer placement), contrast induced nephropathy, bleeding / bruising / hematoma / pseudoaneurysm, vascular or coronary injury (with possible emergent CT or Vascular Surgery), adverse medication reactions, infection.  The increased risk of SVG intervention - MI, no reflow, etc., was discussed.  The patient (and family) voice understanding and agree to proceed.    Consent for signed by MD and patient with RN witness -- placed on chart.  Procedure: The patient was brought to the 2nd Floor [place] [facility] in the fasting state and prepped and draped in the usual sterile fashion for (Right groin or radial) access. Sterile technique was used including antiseptics, cap, gloves, gown, hand hygiene, mask and sheet.  Skin prep: Chlorhexidine;  Time Out: Verified patient identification, verified procedure, site/side was marked, verified correct patient position,  special equipment/implants available, medications/allergies/relevent history reviewed, required imaging and test results available.  Performed  The right femoral head was identified using tactile and fluoroscopic technique.  The right groin was anesthetized with 1% subcutaneous Lidocaine.  The right Common Femoral Artery was accessed using the Modified Seldinger Technique with placement of a antimicrobial bonded/coated single lumen (5 Fr) sheath.  The sheath was aspirated and flushed.    A 5 Fr JL4 followed by a JR4 Catheter was advanced of over a Standard J wire into the ascending Aorta and used to engage the Left then Right Coronary Arteries.  Multiple cineangiographic views of the Both Native Coronary Artery system(s) were performed.  The JR4 catheter was then redirected to engage both SVGs (to OM-LCx and to RCA) and multiple cineangiographic views were performed.  The JR4 catheter was then redirected (with aspiration and flushing) into the Left Subclavian Artery, then exchanged over a long-exchange wire for an IMA catheter that was used to engage the LIMA-LAD graft for multiple cineangiographic images.  This catheter was then pulled back into the descending Aorta and exchanged over the for an angled Pigtail catheter that was advanced across the Aortic Valve.  LV hemodynamics were measured and Left Ventriculography was performed.  LV hemodynamics were then re-sampled, and the catheter was pulled back across the Aortic Valve for measurement of "pull-back" gradient.  The catheter and wire wereremoved completely out of the body.  The patient was transferred to the holding area where the sheath was removed with manual pressure held for hemostasis.    The patient was transported to the PACU holding area in a hemodynamically stable, chest pain free condition.   The patient  was stable before, during and following the procedure.   Patient did tolerate procedure well. There were not complications.  EBL: <  20ml  Medications:  Premedication: 5mg   Valium   Sedation:  1 mg IV Versed, 75 IV mcg Fentanyl  Contrast:  90 ml Omnipaque  Hemodynamics:  Central Aortic Pressure / Mean Aortic Pressure: 120/57 mmHg; 79 mmHg  LV Pressure / LV End diastolic Pressure:  124/17 mmHg; 19 mmHg  Left Ventriculography:  EF:  40-45%  Wall Motion:  Inferior hypokinesis > global hypokinesis.  Coronary Angiographic Data:  Left Main:  Large caliber vessel that bifurcates into the Left Circumflex and LAD; angiographically normal  Left Anterior Descending (LAD): Mild to moderate diffuse proximal disease, then 100% occluded after given off a significant Septal Trunk and a moderate caliber 1st Diagonal branch that has ~50% ostial stenosis followed by diffuse luminal irregularities.  The Mid-Distal LAD is filled via the LIMA graft -- retrograde fills to a Diagonal that is likely #2, anterograde fills a large caliber vessel that wraps the apex giving off several small septal branches as well as an apical diagonal all of which provide extensive collateral circulation to the RPDA filling back to the RCA bifurcation with slight RPL system flow.  Circumflex (LCx):  Moderate to large caliber vessel that gives off a proximal moderate caliber OM1 with mild luminal irregularities.  The vessel is then 100% occluded after this vessel.  ~OM2 and OM3 (LPL1) fill via a Sequential SVG  With some retrograde filling to the Native AVGroove LCx via the OM3, minimal R-L collaterals noted, perhaps due to the significant SVG disease.  Right Coronary Artery: 100 % occluded after the SA Nodal artery.  Right Posterior Descending Artery: fills via L-R collaterals  Grafts  LIMA - LAD: Widely patent  SVG - OM2-OM3:  Large caliber graft with proximal ~50-60^ eccentric lesion followed by multiple focal lesions in the mid vessel -- 70% eccentric followed by a ~80-90% then 95% lesion, then a ~50% lesion more distally with diffuse irregularities in  the most distal segment.  The Sequential limb is widely patent.    SVG - RCA: 100% occluded at the Aorto-Ostium.  Impression:  Severe Native CAD as previously described along with known SCG-RCA occlusion, now complicated by Severe diffusely diseased 66 yr old SVG-OM2-3 that corresponds well to the ischemic distribution noted on the Myoview.    This graft is not a good PCI target as it would require extensive stenting and would not be easy to pass a distal protection device beyond the multiple lesions, making the risk of No-Reflow extremely high.  Extensive collateralization to the RPDA.  Moderately reduced LVEF with borderline EDP.  Plan:  At this point, I feel that the best course of action is to stop now after the diagnostic procedure in order to formulate a viable plan of action.    I will discuss the findings with Dr. Alanda Amass as well as my other interventional colleagues, but I feel that his best option for revascularization would be considering Re-Do CABG with SVG to the LCx system as well as an SVG to the RPDA which is clearly patent filling via collaterals.  I will have him recover in the 6500 post-procedure unit under observation status to allow time for Dr. Tyrone Sage from [facility] to be able to review the films and potentially see him prior to discharge.   He has not had resting angina, only exertional, so he should be safe for discharge home with plan to f/u with [facility] next week if necessary, but would be best to be seen soon.  I will increase  his Ranexa dose & put him back on his 50mg  bid BB dose.  Will d/c Plavix until the question of CABG has been discussed.  If Redo-CABG is not a viable option, I will have reviewed the films with colleagues in order to consider the feasibility of multi-stent SVG PCI.  The case and results was discussed with the patient (and family).  The case and results was discussed with the patient's Cardiologist. CT Surgery has been consulted.  Time  Spend Directly with Patient:  45 minutes  HARDING,DAVID W, M.D., M.S. [facility] [address] [place], [place]  [address]  [phone]  09/01/2011 11:34 AM

## 2011-09-06 ENCOUNTER — Encounter: Payer: Self-pay | Admitting: Cardiothoracic Surgery

## 2011-09-06 ENCOUNTER — Institutional Professional Consult (permissible substitution) (INDEPENDENT_AMBULATORY_CARE_PROVIDER_SITE_OTHER): Payer: Medicare Other | Admitting: Cardiothoracic Surgery

## 2011-09-06 VITALS — BP 108/68 | HR 60 | Resp 18 | Ht 74.5 in | Wt 271.0 lb

## 2011-09-06 DIAGNOSIS — I2510 Atherosclerotic heart disease of native coronary artery without angina pectoris: Secondary | ICD-10-CM

## 2011-09-06 NOTE — Patient Instructions (Addendum)
Coronary Artery Bypass Grafting Coronary artery bypass grafting (CABG) is done to bypass or fix arteries of the heart (coronary) that have become narrow or blocked. This is usually the result of plaque built up in the walls of the vessels. The coronary arteries supply the heart with the oxygen and nutrients it needs to pump blood to your body. The heart never rests and needs constant blood flow. If an artery is partially blocked, you may have chest pain (angina). Lack of blood flow to part of the heart muscle may cause that part to die. This is what happens in a heart attack (myocardial infarction). Reasons for CABG include:  Arteries that cannot be treated with medications or other interventions (such as a heart stent).   Severe angina not responsive to other treatment.   Improving heart function.   Treating a heart attack.  Every person is unique. Be sure you understand the risks and benefits of CABG.  RISKS AND COMPLICATIONS Your surgeon will discuss these with you. Your risks will be different depending on your past and present health and other factors. It may be helpful to have a family member or advocate with you so you feel free to ask questions and get the answers you need to give an informed consent. Possible problems of CABG surgery include:  Blood loss and replacement.   Stroke.   Infection.   Surgical site pain.   Heart attack during or after.   Kidney failure.  BEFORE THE PROCEDURE  Tell your doctor about any allergies, medicines, bleeding problems and other surgeries.   Take all medicines exactly as directed. You may start new medicines and stop taking others. Do not stop medications or adjust dosages on your own. Continue taking the medications up until the time of surgery.   Perform only activities that are suggested by your caregiver.   If you are overweight, you should try to lose weight. Eat a heart-healthy diet that is low in fat and salt.   If you smoke,  quit.   Let your caregiver know if you have been on steroids (including creams or drops) for long periods of time. This is critical.   If you are diabetic discuss with your doctor whether or not you should take insulin the day of your surgery.  DAY OF SURGERY:  Plan to arrive 60 minutes before the scheduled time or as directed.   Do not eat or drink anything, including medicine, unless directed.   You will sign a written consent. You may have blood work and other tests done.   Your family will be shown where they can wait for the surgeon to talk to them when the surgery is over.  PROCEDURE Only a specially trained surgeon does a CABG assisted by a team of other health care professionals. You will be asleep and not feel any discomfort during the surgery.    Traditional method:   A cut (incision) is made down the front of the chest through the breastbone (sternum).   The sternum is spread open so the surgeon can see your heart.   You are connected to a machine that does the work of your heart and lungs and your heart stops beating.   Veins are taken from your leg(s) and used to bypass the blocked arteries of your heart. Sometimes an artery from inside your chest wall is used, either by itself or along with leg veins.   When the bypasses are done, you are taken off the machine,  and your heart is restarted and takes over again.   Alternate methods:   The heart lung machine is not used. This is called off-pump. Your heart continues to beat while the bypasses are done.   Smaller incisions are used instead of going through the middle of the chest (minimally invasive).   Intended to reduce pain and promote a faster recovery.  AFTER THE PROCEDURE  You may wake up with a tube in your throat to help your breathing. You may be connected to a breathing machine.   You will not be able to talk while the tube is in place. Try not to fight against it. The tube will be taken out as soon as it  is safe.   Even if you cannot see them, there are nurses nearby who are watching everything. You are not alone.   Your family should be prepared to see you with many tubes and wires. You will be sleepy and pale. Your family can hold your hand and speak to you, but you may have no memory of this time.  SEEK IMMEDIATE MEDICAL CARE IF:  You have severe chest pain, especially if the pain is crushing or pressure-like and spreads to the arms, back, neck, or jaw, or if you have sweating, feel sick to your stomach (nausea), or shortness of breath. THIS IS AN EMERGENCY. Do not wait to see if the pain will go away. Get medical help at once. Call your local emergency services (911 in [place] DO NOT drive yourself to the hospital.   You have an attack of chest pain lasting longer than usual, despite rest and treatment with the medications your doctor has prescribed.   You wake from sleep with chest pain.   You feel dizzy or faint.  Document Released: 10/19/2004 Document Revised: 101/31/202012 Document Reviewed: 06/26/2007 [facility] Patient Information 2012 [place], [place].Coronary Artery Bypass Grafting Care After Refer to this sheet in the next few weeks. These instructions provide you with information on caring for yourself after your procedure. Your caregiver may also give you more specific instructions. Your treatment has been planned according to current medical practices, but problems sometimes occur. Call your caregiver if you have any problems or questions after your procedure.   Recovery from open heart surgery will be different for everyone. Some people feel well after 3 or 4 weeks, while for others it takes longer. After heart surgery, it may be normal to:  Not have an appetite, feel nauseated by the smell of food, or only want to eat a small amount.   Be constipated because of changes in your diet, activity, and medicines. Eat foods high in fiber. Add fresh fruits and vegetables to your diet. Stool  softeners may be helpful.   Feel sad or unhappy. You may be frustrated or cranky. You may have good days and bad days. Do not give up. Talk to your caregiver if you do not feel better.   Feel weakness and fatigue. You many need physical therapy or cardiac rehabilitation to get your strength back.   Develop an irregular heartbeat called atrial fibrillation. Symptoms of atrial fibrillation are a fast, irregular heartbeat or feelings of fluttery heartbeats, shortness of breath, low blood pressure, and dizziness. If these symptoms develop, see your caregiver right away.  MEDICATION  Have a list of all the medicines you will be taking when you leave the hospital. For every medicine, know the following:   Name.   Exact dose.   Time of day  to be taken.   How often it should be taken.   Why you are taking it.   Ask which medicines should or should not be taken together. If you take more than one heart medicine, ask if it is okay to take them together. Some heart medicines should not be taken at the same time because they may lower your blood pressure too much.   Narcotic pain medicine can cause constipation. Eat fresh fruits and vegetables. Add fiber to your diet. Stool softener medicine may help relieve constipation.   Keep a copy of your medicines with you at all times.   Do not add or stop taking any medicine until you check with your caregiver.   Medicines can have side effects. Call your caregiver who prescribed the medicine if you:   Start throwing up, have diarrhea, or have stomach pain.   Feel dizzy or lightheaded when you stand up.   Feel your heart is skipping beats or is beating too fast or too slow.   Develop a rash.   Notice unusual bruising or bleeding.  HOME CARE INSTRUCTIONS  After heart surgery, it is important to learn how to take your pulse. Have your caregiver show you how to take your pulse.   Use your incentive spirometer. Ask your caregiver how long after  surgery you need to use it.  Care of your chest incision  Tell your caregiver right away if you notice clicking in your chest (sternum).   Support your chest with a pillow or your arms when you take deep breaths and cough.   Follow your caregiver's instructions about when you can bathe or swim.   Protect your incision from sunlight during the first year to keep the scar from getting dark.   Tell your caregiver if you notice:   Increased tenderness of your incision.   Increased redness or swelling around your incision.   Drainage or pus from your incision.  Care of your leg incision(s)  Avoid crossing your legs.   Avoid sitting for long periods of time. Change positions every half hour.   Elevate your leg(s) when you are sitting.   Check your leg(s) daily for swelling. Check the incisions for redness or drainage.   Wear your elastic stockings as told by your caregiver. Take them off at bedtime.  Diet  Diet is very important to heart health.   Eat plenty of fresh fruits and vegetables. Meats should be lean cut. Avoid canned, processed, and fried foods.   Talk to a dietician. They can teach you how to make healthy food and drink choices.  Weight  Weigh yourself every day. This is important because it helps to know if you are retaining fluid that may make your heart and lungs work harder.   Use the same scale each time.   Weigh yourself every morning at the same time. You should do this after you go to the bathroom, but before you eat breakfast.   Your weight will be more accurate if you do not wear any clothes.   Record your weight.   Tell your caregiver if you have gained 2 pounds or more overnight.  Activity Stop any activity at once if you have chest pain, shortness of breath, irregular heartbeats, or dizziness. Get help right away if you have any of these symptoms.  Bathing.  Avoid soaking in a bath or hot tub until your incisions are healed.   Rest. You need a  balance of rest and  activity.   Exercise. Exercise per your caregiver's advice. You may need physical therapy or cardiac rehabilitation to help strengthen your muscles and build your endurance.   Climbing stairs. Unless your caregiver tells you not to climb stairs, go up stairs slowly and rest if you tire. Do not pull yourself up by the handrail.   Driving a car. Follow your caregiver's advice on when you may drive. You may ride as a passenger at any time. When traveling for long periods of time in a car, get out of the car and walk around for a few minutes every 2 hours.   Lifting. Avoid lifting, pushing, or pulling anything heavier than 10 pounds for 6 weeks after surgery or as told by your caregiver.   Returning to work. Check with your caregiver. People heal at different rates. Most people will be able to go back to work 6 to 12 weeks after surgery.   Sexual activity. You may resume sexual relations as told by your caregiver.  SEEK MEDICAL CARE IF:  Any of your incisions are red, painful, or have any type of drainage coming from them.   You have an oral temperature above 102 F (38.9 C).   You have ankle or leg swelling.   You have pain in your legs.   You have weight gain of 2 or more pounds a day.   You feel dizzy or lightheaded when you stand up.  SEEK IMMEDIATE MEDICAL CARE IF:  You have angina or chest pain that goes to your jaw or arms. Call your local emergency services right away.   You have shortness of breath at rest or with activity.   You have a fast or irregular heartbeat (arrhythmia).   There is a "clicking" in your sternum when you move.   You have numbness or weakness in your arms or legs.  MAKE SURE YOU:  Understand these instructions.   Will watch your condition.   Will get help right away if you are not doing well or get worse.  Document Released: 07/29/2004 Document Revised: 106-14-202012 Document Reviewed: 03/16/2010 [facility] Patient Information  2012 [place], [place].

## 2011-09-06 NOTE — Progress Notes (Signed)
[address]            [place] [address]          [id number]      Nicholas Lynn [facility] Medical Record #[id number] Date of Birth: [birth year]  Referring: Marykay Lex, MD Primary Care: Maryelizabeth Rowan, MD  Chief Complaint:    Chief Complaint  Patient presents with  . Coronary Artery Disease    Referral from Dr Herbie Baltimore for eval on CAD, Cardiac Cath on 09/01/2011    History of Present Illness:    Patient had cabg x4  02/22/1994 . [place]ima to LAD, vein to om2 and distal cirx and PDA. He has done well until two weeks ago.    (Cath in 10/8 - LIMA-LAD, SVG-OM patent; occluded SVG-RCA with L-R collaterals, 50% SVG-OM lesion; EF 40-45%)  He tolerated recent Umbilical Hernia repair in 3/13, but after having his BB dose decreased due to bradycardia in July 2013, he began to note typical exertional angina relieved by rest. He was evaluated with a Myoview ST on 08/30/11 with that demonstrated High -Risk ischemia in the LCx distribution that would be worrisome for jeopardized L-R collaterals. He had anterior MI in 1988 and treated with angioplasty.  EF was noted to be 40% in 1996  Current Activity/ Functional Status: Patient is} independent with mobility/ambulation, transfers, ADL's, IADL's.   Past Medical History  Diagnosis Date  . Hyperlipidemia   . Heart disease   . Hypertension   . FH: CABG (coronary artery bypass surgery)   . Hernia     umbilical  . GERD (gastroesophageal reflux disease)   . Wears glasses   . Allergy   . Hearing loss   . Arthritis   . Heart attack 1988  . Coronary artery disease   . Sleep apnea     USES C-PAP  . Dysrhythmia     OCCASIONAL PALPITATION  . Shortness of breath 09/01/2011    recent    Past Surgical History  Procedure Date  . Angioplasty   . Arterial bypass surgry   . Cardiac catheterization   . Coronary artery bypass graft 1996    3 VESSELS  . Cataract extraction   . Umbilical hernia repair 04/04/2011     Procedure: HERNIA REPAIR UMBILICAL ADULT;  Surgeon: Adolph Pollack, MD;  Location: [facility];  Service: General;  Laterality: N/A;  Umbilical Hernia Repair    Family History  Problem Relation Age of Onset  . Stroke Mother   . Cancer Father     lung  . Cancer Sister     lung  . Cancer Sister     liver    History   Social History  . Marital Status: Married    Spouse Name: N/A    Number of Children: N/A  . Years of Education: N/A   Occupational History  . Not on file.   Social History Main Topics  . Smoking status: Former Smoker    Quit date: 03/27/1986  . Smokeless tobacco: Never Used  . Alcohol Use: No  . Drug Use: No  . Sexually Active: Not on file      History  Smoking status  . Former Smoker  . Quit date: 03/27/1986  Smokeless tobacco  . Never Used    History  Alcohol Use No     Allergies  Allergen Reactions  . Sulfur Swelling  Causes all joints to swell. Unable to bend them.    Current Outpatient Prescriptions  Medication Sig Dispense Refill  . acetaminophen (TYLENOL) 325 MG tablet Take 2 tablets (650 mg total) by mouth every 4 (four) hours as needed.      Marland Kitchen aspirin EC 325 MG tablet Take 1 tablet (325 mg total) by mouth daily.  100 tablet  3  . benazepril (LOTENSIN) 20 MG tablet Take 20 mg by mouth at bedtime.       Marland Kitchen Bioflavonoid Products (ESTER C PO) Take 500 mg by mouth daily.        . cetirizine (ZYRTEC) 10 MG tablet Take 10 mg by mouth daily.        . Cholecalciferol (VITAMIN D-3 PO) Take 1,000 Units by mouth 2 (two) times daily.        Marland Kitchen ezetimibe-simvastatin (VYTORIN) 10-20 MG per tablet Take 1 tablet by mouth at bedtime.       . fluticasone (VERAMYST) 27.5 MCG/SPRAY nasal spray Place 2 sprays into the nose as needed. Allergies       . isosorbide mononitrate (IMDUR) 30 MG 24 hr tablet Take 15 mg by mouth daily. Takes 15 mg po daily, 1/2 tab of 30 mg per day      . metoprolol (LOPRESSOR) 50 MG tablet Take 50 mg by mouth 2 (two) times  daily.       . Misc Natural Products (OSTEO BI-FLEX ADV TRIPLE ST PO) Take 1 tablet by mouth daily.       . montelukast (SINGULAIR) 10 MG tablet Take 10 mg by mouth at bedtime.       . Multiple Vitamin (MULTIVITAMIN WITH MINERALS) TABS Take 1 tablet by mouth daily.      . niacin (NIASPAN) 1000 MG CR tablet Take 1,000 mg by mouth at bedtime.        . nitroGLYCERIN (NITROSTAT) 0.4 MG SL tablet Place 0.4 mg under the tongue every 5 (five) minutes as needed. Chest pain       . Omega-3 Fatty Acids (FISH OIL PO) Take 1,000 mg by mouth 2 (two) times daily.        Marland Kitchen omeprazole (PRILOSEC) 20 MG capsule Take 20 mg by mouth daily.       . ranolazine (RANEXA) 500 MG 12 hr tablet Take 2 tablets (1,000 mg total) by mouth 2 (two) times daily.           Review of Systems:     Cardiac Review of Systems: Y or N  Chest Pain [ y   ]  Resting SOB [   ] Exertional SOB  Cove.Etienne  ]  Orthopnea [ n ]   Pedal Edema [ n  ]    Palpitations [ y for years ] Syncope  [ n ]   Presyncope [ n  ]  General Review of Systems: [Y] = yes [  ]=no Constitional: recent weight change [  ]; anorexia [  ]; fatigue [  ]; nausea [  ]; night sweats [  ]; fever [  ]; or chills [  ];  Dental: poor dentition[n  ]; Last Dentist visit:  Eye : blurred vision [  ]; diplopia [   ]; vision changes [  ];  Amaurosis fugax[  ]; Resp: cough [  ];  wheezing[  ];  hemoptysis[  ]; shortness of breath[  ]; paroxysmal nocturnal dyspnea[  ]; dyspnea on exertion[  ]; or orthopnea[  ];  GI:  gallstones[  ], vomiting[  ];  dysphagia[  ]; melena[  ];  hematochezia [  ]; heartburn[  ];   Hx of  Colonoscopy[ y ]; GU: kidney stones [  ]; hematuria[  ];   dysuria [  ];  nocturia[  ];  history of     obstruction [  ];             Skin: rash, swelling[  ];, hair loss[  ];  peripheral edema[  ];  or itching[  ]; Musculosketetal:  myalgias[  ];  joint swelling[  ];  joint erythema[  ];  joint pain[  ];  back pain[  ];  Heme/Lymph: bruising[  ];  bleeding[  ];  anemia[  ];  Neuro: TIA[ n ];  headaches[  ];  stroke[  ];  vertigo[  ];  seizures[  ];   paresthesias[  ];  difficulty walking[  ];  Psych:depression[  ]; anxiety[  ];  Endocrine: diabetes[ nn ];  thyroid dysfunction[n  ];  Immunizations: Flu Patrice.Shin  ]; Pneumococcal[y  ];  Other:  Physical Exam: BP 108/68  Pulse 60  Resp 18  Ht 6' 2.5" (1.892 m)  Wt 271 lb (122.925 kg)  BMI 34.33 kg/m2  SpO2 97%  General appearance: alert, cooperative and appears stated age Neurologic: intact Heart: regular rate and rhythm, S1, S2 normal, no murmur, click, rub or gallop and normal apical impulse Lungs: clear to auscultation bilaterally and normal percussion bilaterally Abdomen: soft, non-tender; bowel sounds normal; no masses,  no organomegaly Extremities: extremities normal, atraumatic, no cyanosis or edema, Homans sign is negative, no sign of DVT, no ulcers, gangrene or trophic changes and vein harvest fromrt lower leg Wound: sternum stable and well healed no cartid bruits,not palpable AAA   Diagnostic Studies & Laboratory data:     Recent Radiology Findings:   No results found.   Recent Lab Findings: Lab Results  Component Value Date   WBC 7.5 03/27/2011   HGB 14.9 03/27/2011   HCT 43.7 03/27/2011   PLT 152 03/27/2011   GLUCOSE 116* 03/27/2011   ALT 17 03/27/2011   AST 16 03/27/2011   NA 140 03/27/2011   K 4.0 03/27/2011   CL 104 03/27/2011   CREATININE 0.83 03/27/2011   BUN 10 03/27/2011   CO2 28 03/27/2011   INR 1.01 03/27/2011      Hemodynamics:  Central Aortic Pressure / Mean Aortic Pressure: 120/57 mmHg; 79 mmHg  LV Pressure / LV End diastolic Pressure: 124/17 mmHg; 19 mmHg  Left Ventriculography:  EF: 40-45%  Wall Motion: Inferior hypokinesis > global hypokinesis.  Coronary Angiographic Data:  Left Main: Large caliber vessel that bifurcates into the Left  Circumflex and LAD; angiographically normal  Left Anterior Descending (LAD): Mild to moderate diffuse proximal disease, then 100% occluded after given off a significant Septal Trunk and a moderate caliber 1st Diagonal branch that has ~50% ostial stenosis followed by diffuse luminal irregularities.  The Mid-Distal LAD is filled via the LIMA graft -- retrograde fills to a Diagonal that is likely #2, anterograde fills a large caliber vessel  that wraps the apex giving off several small septal branches as well as an apical diagonal all of which provide extensive collateral circulation to the RPDA filling back to the RCA bifurcation with slight RPL system flow.  Circumflex (LCx): Moderate to large caliber vessel that gives off a proximal moderate caliber OM1 with mild luminal irregularities. The vessel is then 100% occluded after this vessel.  ~OM2 and OM3 (LPL1) fill via a Sequential SVG With some retrograde filling to the Native AVGroove LCx via the OM3, minimal R-L collaterals noted, perhaps due to the significant SVG disease.  Right Coronary Artery: 100 % occluded after the SA Nodal artery.  Right Posterior Descending Artery: fills via L-R collaterals Grafts  LIMA - LAD: Widely patent  SVG - OM2-OM3: Large caliber graft with proximal ~50-60^ eccentric lesion followed by multiple focal lesions in the mid vessel -- 70% eccentric followed by a ~80-90% then 95% lesion, then a ~50% lesion more distally with diffuse irregularities in the most distal segment. The Sequential limb is widely patent.  SVG - RCA: 100% occluded at the Aorto-Ostium. Impression:  Severe Native CAD as previously described along with known SCG-RCA occlusion, now complicated by Severe diffusely diseased 65 yr old SVG-OM2-3 that corresponds well to the ischemic distribution noted on the Myoview.  This graft is not a good PCI target as it would require extensive stenting and would not be easy to pass a distal protection device beyond the  multiple lesions, making the risk of No-Reflow extremely high.  Extensive collateralization to the RPDA.  Moderately reduced LVEF with borderline EDP            Assessment / Plan:      With patiens symptoms and severely diseased vein graft to cix, redo cabg has been recommended Will check preop dopplers especially bp in upper extremities, question of history of subclavian stenosis in past Plan redo CABG Monday 8/18  The goals risks and alternatives of the planned surgical procedure redo cabg have been discussed with the patient in detail. The risks of the procedure including death, infection, stroke, myocardial infarction, bleeding, blood transfusion have all been discussed specifically.  I have quoted Nicholas Lynn a 8% of perioperative mortality and a complication rate as high as 25 %. The patient's questions have been answered.Nicholas Lynn is willing  to proceed with the planned procedure.   Delight Ovens MD  Beeper [phone] Office [phone] 09/06/2011 10:08 PM

## 2011-09-07 ENCOUNTER — Other Ambulatory Visit: Payer: Self-pay

## 2011-09-07 ENCOUNTER — Encounter (HOSPITAL_COMMUNITY): Payer: Self-pay | Admitting: Pharmacy Technician

## 2011-09-07 DIAGNOSIS — I2510 Atherosclerotic heart disease of native coronary artery without angina pectoris: Secondary | ICD-10-CM

## 2011-09-08 ENCOUNTER — Ambulatory Visit (HOSPITAL_COMMUNITY)
Admission: RE | Admit: 2011-09-08 | Discharge: 2011-09-08 | Disposition: A | Discharge status: Home / Self Care | Payer: Medicare Other | Source: Ambulatory Visit | Attending: Cardiothoracic Surgery | Admitting: Cardiothoracic Surgery

## 2011-09-08 ENCOUNTER — Encounter (HOSPITAL_COMMUNITY): Payer: Self-pay

## 2011-09-08 ENCOUNTER — Other Ambulatory Visit (HOSPITAL_COMMUNITY): Payer: Self-pay

## 2011-09-08 ENCOUNTER — Encounter (HOSPITAL_COMMUNITY)
Admission: RE | Admit: 2011-09-08 | Discharge: 2011-09-08 | Disposition: A | Discharge status: Home / Self Care | Payer: Medicare Other | Source: Ambulatory Visit | Attending: Cardiothoracic Surgery | Admitting: Cardiothoracic Surgery

## 2011-09-08 ENCOUNTER — Inpatient Hospital Stay (HOSPITAL_COMMUNITY)
Admission: RE | Admit: 2011-09-08 | Discharge: 2011-09-08 | Disposition: A | Discharge status: Home / Self Care | Payer: Medicare Other | Source: Ambulatory Visit | Attending: Cardiothoracic Surgery | Admitting: Cardiothoracic Surgery

## 2011-09-08 VITALS — BP 109/63 | HR 51 | Temp 99.0°F | Resp 20 | Ht 74.0 in | Wt 272.3 lb

## 2011-09-08 DIAGNOSIS — I2510 Atherosclerotic heart disease of native coronary artery without angina pectoris: Secondary | ICD-10-CM

## 2011-09-08 DIAGNOSIS — Z01810 Encounter for preprocedural cardiovascular examination: Secondary | ICD-10-CM

## 2011-09-08 DIAGNOSIS — Z01812 Encounter for preprocedural laboratory examination: Secondary | ICD-10-CM | POA: Insufficient documentation

## 2011-09-08 DIAGNOSIS — Z01818 Encounter for other preprocedural examination: Secondary | ICD-10-CM | POA: Insufficient documentation

## 2011-09-08 LAB — COMPREHENSIVE METABOLIC PANEL
ALT: 16 U/L (ref 0–53)
AST: 19 U/L (ref 0–37)
Albumin: 3.5 g/dL (ref 3.5–5.2)
Alkaline Phosphatase: 61 U/L (ref 39–117)
BUN: 13 mg/dL (ref 6–23)
CO2: 25 mEq/L (ref 19–32)
Calcium: 9.6 mg/dL (ref 8.4–10.5)
Chloride: 104 mEq/L (ref 96–112)
Creatinine, Ser: 0.91 mg/dL (ref 0.50–1.35)
GFR calc Af Amer: >90 mL/min (ref >90)
GFR calc non Af Amer: 85 mL/min — ABNORMAL LOW (ref >90)
Glucose, Bld: 92 mg/dL (ref 70–99)
Potassium: 4.6 mEq/L (ref 3.5–5.1)
Sodium: 139 mEq/L (ref 135–145)
Total Bilirubin: 0.4 mg/dL (ref 0.3–1.2)
Total Protein: 6.5 g/dL (ref 6.0–8.3)

## 2011-09-08 LAB — CBC
HCT: 43 % (ref 39.0–52.0)
Hemoglobin: 14.9 g/dL (ref 13.0–17.0)
MCH: 30.3 pg (ref 26.0–34.0)
MCHC: 34.7 g/dL (ref 30.0–36.0)
MCV: 87.4 fL (ref 78.0–100.0)
Platelets: 159 10*3/uL (ref 150–400)
RBC: 4.92 MIL/uL (ref 4.22–5.81)
RDW: 13.3 % (ref 11.5–15.5)
WBC: 8 10*3/uL (ref 4.0–10.5)

## 2011-09-08 LAB — BLOOD GAS, ARTERIAL
Acid-Base Excess: 2 mmol/L (ref 0.0–2.0)
Bicarbonate: 25.8 mEq/L — ABNORMAL HIGH (ref 20.0–24.0)
Drawn by: 206361
FIO2: 0.21 %
O2 Saturation: 93.4 %
Patient temperature: 98.6
TCO2: 27 mmol/L (ref 0–100)
pCO2 arterial: 38.9 mmHg (ref 35.0–45.0)
pH, Arterial: 7.437 (ref 7.350–7.450)
pO2, Arterial: 66.6 mmHg — ABNORMAL LOW (ref 80.0–100.0)

## 2011-09-08 LAB — HEMOGLOBIN A1C
Hgb A1c MFr Bld: 6.1 % — ABNORMAL HIGH (ref <5.7)
Mean Plasma Glucose: 128 mg/dL — ABNORMAL HIGH (ref <117)

## 2011-09-08 LAB — PULMONARY FUNCTION TEST

## 2011-09-08 LAB — PROTIME-INR
INR: 1 (ref 0.00–1.49)
Prothrombin Time: 13.4 seconds (ref 11.6–15.2)

## 2011-09-08 LAB — SURGICAL PCR SCREEN
MRSA, PCR: NEGATIVE
Staphylococcus aureus: NEGATIVE

## 2011-09-08 LAB — URINALYSIS, ROUTINE W REFLEX MICROSCOPIC
Bilirubin Urine: NEGATIVE
Glucose, UA: NEGATIVE mg/dL
Hgb urine dipstick: NEGATIVE
Ketones, ur: NEGATIVE mg/dL
Leukocytes, UA: NEGATIVE
Nitrite: NEGATIVE
Protein, ur: NEGATIVE mg/dL
Specific Gravity, Urine: 1.017 (ref 1.005–1.030)
Urobilinogen, UA: 0.2 mg/dL (ref 0.0–1.0)
pH: 8 (ref 5.0–8.0)

## 2011-09-08 LAB — APTT: aPTT: 29 seconds (ref 24–37)

## 2011-09-08 LAB — ABO/RH: ABO/RH(D): O NEG

## 2011-09-08 MED ORDER — ALBUTEROL SULFATE (5 MG/ML) 0.5% IN NEBU
2.5000 mg | INHALATION_SOLUTION | Freq: Once | RESPIRATORY_TRACT | Status: AC
  Administered 2011-09-08: 2.5 mg via RESPIRATORY_TRACT

## 2011-09-08 NOTE — Progress Notes (Signed)
Requested ov, Stress test ,ECHO from [facility].

## 2011-09-08 NOTE — Pre-Procedure Instructions (Signed)
20 Nicholas Lynn  09/08/2011   Your procedure is scheduled on:  09-11-2011  Report to [facility] at 5:30 AM.  Call this number if you have problems the morning of surgery: [phone]   Remember:   Do not eat food or drink:After Midnight.      Take these medicines the morning of surgery with A SIP OF WATER: zyrtec,nasal spray as needed,Isosorbide(Imdur),Metoprolol(Lopressor),omeprozole(Prilosec),Ranolazine(Ranexa)   Do not wear jewelry, make-up or nail polish.  Do not wear lotions, powders, or perfumes. You may wear deodorant.  Do not shave 48 hours prior to surgery. Men may shave face and neck.  Do not bring valuables to the hospital.  Contacts, dentures or bridgework may not be worn into surgery.  Leave suitcase in the car. After surgery it may be brought to your room.  For patients admitted to the hospital, checkout time is 11:00 AM the day of discharge.     Special Instructions: Incentive Spirometry - Practice and bring it with you on the day of surgery. and CHG Shower Use Special Wash: 1/2 bottle night before surgery and 1/2 bottle morning of surgery.       Please read over the following fact sheets that you were given: Pain Booklet, Blood Transfusion Information, Open Heart Packet, MRSA Information and Surgical Site Infection Prevention

## 2011-09-08 NOTE — Progress Notes (Signed)
VASCULAR LAB PRELIMINARY  PRELIMINARY  PRELIMINARY  PRELIMINARY  Pre-op Cardiac Surgery  Carotid Findings:  No evidence of significant ICA stenosis and vertebral artery flow is antegrade bilaterally.    Upper Extremity Right Left  Brachial Pressures 128 T 123 T  Radial Waveforms T T  Ulnar Waveforms T M  Palmar Arch (Allen's Test) * **     Findings:  *Right:  Doppler waveforms remain normal with radial and ulnar compressions.                     **Left:  Doppler waveforms obliterate with radial and remain normal with ulnar compressions.    Lower  Extremity Right Left  Dorsalis Pedis    Anterior Tibial    Posterior Tibial    Ankle/Brachial Indices      Findings:  Palpable pedal pulses x 4.     Nicholas Lynn, 09/08/2011, 1:54 PM

## 2011-09-10 MED ORDER — TRANEXAMIC ACID 100 MG/ML IV SOLN
1.5000 mg/kg/h | INTRAVENOUS | Status: AC
  Administered 2011-09-11 (×2): 1.5 mg/kg/h via INTRAVENOUS
  Filled 2011-09-10: qty 25

## 2011-09-10 MED ORDER — DOPAMINE-DEXTROSE 3.2-5 MG/ML-% IV SOLN
2.0000 ug/kg/min | INTRAVENOUS | Status: AC
  Administered 2011-09-11: 3 ug/kg/min via INTRAVENOUS
  Filled 2011-09-10: qty 250

## 2011-09-10 MED ORDER — NITROGLYCERIN IN D5W 200-5 MCG/ML-% IV SOLN
2.0000 ug/min | INTRAVENOUS | Status: AC
  Administered 2011-09-11: 5 ug/min via INTRAVENOUS
  Filled 2011-09-10: qty 250

## 2011-09-10 MED ORDER — TRANEXAMIC ACID (OHS) PUMP PRIME SOLUTION
2.0000 mg/kg | INTRAVENOUS | Status: DC
  Filled 2011-09-10 (×2): qty 2.47

## 2011-09-10 MED ORDER — MAGNESIUM SULFATE 50 % IJ SOLN
40.0000 meq | INTRAMUSCULAR | Status: DC
  Filled 2011-09-10: qty 10

## 2011-09-10 MED ORDER — DEXTROSE 5 % IV SOLN
1.5000 g | INTRAVENOUS | Status: AC
  Administered 2011-09-11: .75 g via INTRAVENOUS
  Administered 2011-09-11: 1.5 g via INTRAVENOUS
  Filled 2011-09-10: qty 1.5

## 2011-09-10 MED ORDER — POTASSIUM CHLORIDE 2 MEQ/ML IV SOLN
80.0000 meq | INTRAVENOUS | Status: DC
  Filled 2011-09-10: qty 40

## 2011-09-10 MED ORDER — EPINEPHRINE HCL 1 MG/ML IJ SOLN
0.5000 ug/min | INTRAVENOUS | Status: DC
  Filled 2011-09-10: qty 4

## 2011-09-10 MED ORDER — TRANEXAMIC ACID (OHS) BOLUS VIA INFUSION
15.0000 mg/kg | INTRAVENOUS | Status: AC
  Administered 2011-09-11: 1852.5 mg via INTRAVENOUS
  Filled 2011-09-10: qty 1853

## 2011-09-10 MED ORDER — VANCOMYCIN HCL 1000 MG IV SOLR
1500.0000 mg | INTRAVENOUS | Status: AC
  Administered 2011-09-11: 1500 mg via INTRAVENOUS
  Filled 2011-09-10: qty 1500

## 2011-09-10 MED ORDER — SODIUM CHLORIDE 0.9 % IV SOLN
INTRAVENOUS | Status: AC
  Administered 2011-09-11: 1 [IU]/h via INTRAVENOUS
  Filled 2011-09-10: qty 1

## 2011-09-10 MED ORDER — PHENYLEPHRINE HCL 10 MG/ML IJ SOLN
30.0000 ug/min | INTRAVENOUS | Status: AC
  Administered 2011-09-11: 5 ug/min via INTRAVENOUS
  Filled 2011-09-10: qty 2

## 2011-09-10 MED ORDER — DEXTROSE 5 % IV SOLN
750.0000 mg | INTRAVENOUS | Status: DC
  Filled 2011-09-10: qty 750

## 2011-09-10 MED ORDER — CHLORHEXIDINE GLUCONATE 4 % EX LIQD: 30.0000 mL | CUTANEOUS | Status: DC

## 2011-09-10 MED ORDER — DEXMEDETOMIDINE HCL IN NACL 400 MCG/100ML IV SOLN
0.1000 ug/kg/h | INTRAVENOUS | Status: AC
  Administered 2011-09-11: 0.2 ug/kg/h via INTRAVENOUS
  Filled 2011-09-10: qty 100

## 2011-09-11 ENCOUNTER — Encounter (HOSPITAL_COMMUNITY)
Admission: RE | Disposition: A | Discharge status: Home / Self Care | Payer: Self-pay | Source: Ambulatory Visit | Attending: Cardiothoracic Surgery

## 2011-09-11 ENCOUNTER — Encounter (HOSPITAL_COMMUNITY): Payer: Self-pay | Admitting: *Deleted

## 2011-09-11 ENCOUNTER — Inpatient Hospital Stay (HOSPITAL_COMMUNITY)
Admission: RE | Admit: 2011-09-11 | Discharge: 2011-09-21 | DRG: 236 | Disposition: A | Discharge status: Home / Self Care | Payer: Medicare Other | Source: Ambulatory Visit | Attending: Cardiothoracic Surgery | Admitting: Cardiothoracic Surgery

## 2011-09-11 ENCOUNTER — Encounter (HOSPITAL_COMMUNITY): Payer: Self-pay | Admitting: Anesthesiology

## 2011-09-11 ENCOUNTER — Inpatient Hospital Stay (HOSPITAL_COMMUNITY): Payer: Medicare Other

## 2011-09-11 ENCOUNTER — Inpatient Hospital Stay (HOSPITAL_COMMUNITY): Payer: Medicare Other | Admitting: Anesthesiology

## 2011-09-11 DIAGNOSIS — Z823 Family history of stroke: Secondary | ICD-10-CM

## 2011-09-11 DIAGNOSIS — E119 Type 2 diabetes mellitus without complications: Secondary | ICD-10-CM | POA: Diagnosis present

## 2011-09-11 DIAGNOSIS — I2582 Chronic total occlusion of coronary artery: Secondary | ICD-10-CM | POA: Diagnosis present

## 2011-09-11 DIAGNOSIS — Z9861 Coronary angioplasty status: Secondary | ICD-10-CM

## 2011-09-11 DIAGNOSIS — K219 Gastro-esophageal reflux disease without esophagitis: Secondary | ICD-10-CM | POA: Diagnosis present

## 2011-09-11 DIAGNOSIS — Z79899 Other long term (current) drug therapy: Secondary | ICD-10-CM

## 2011-09-11 DIAGNOSIS — I4891 Unspecified atrial fibrillation: Secondary | ICD-10-CM

## 2011-09-11 DIAGNOSIS — G4733 Obstructive sleep apnea (adult) (pediatric): Secondary | ICD-10-CM | POA: Diagnosis present

## 2011-09-11 DIAGNOSIS — Y9210 Unspecified residential institution as the place of occurrence of the external cause: Secondary | ICD-10-CM | POA: Diagnosis not present

## 2011-09-11 DIAGNOSIS — Z7901 Long term (current) use of anticoagulants: Secondary | ICD-10-CM

## 2011-09-11 DIAGNOSIS — I480 Paroxysmal atrial fibrillation: Secondary | ICD-10-CM | POA: Diagnosis not present

## 2011-09-11 DIAGNOSIS — E785 Hyperlipidemia, unspecified: Secondary | ICD-10-CM | POA: Diagnosis present

## 2011-09-11 DIAGNOSIS — Y832 Surgical operation with anastomosis, bypass or graft as the cause of abnormal reaction of the patient, or of later complication, without mention of misadventure at the time of the procedure: Secondary | ICD-10-CM | POA: Diagnosis not present

## 2011-09-11 DIAGNOSIS — I2589 Other forms of chronic ischemic heart disease: Secondary | ICD-10-CM | POA: Diagnosis present

## 2011-09-11 DIAGNOSIS — E8779 Other fluid overload: Secondary | ICD-10-CM | POA: Diagnosis not present

## 2011-09-11 DIAGNOSIS — Z801 Family history of malignant neoplasm of trachea, bronchus and lung: Secondary | ICD-10-CM

## 2011-09-11 DIAGNOSIS — T8140XA Infection following a procedure, unspecified, initial encounter: Secondary | ICD-10-CM | POA: Diagnosis not present

## 2011-09-11 DIAGNOSIS — Z7982 Long term (current) use of aspirin: Secondary | ICD-10-CM

## 2011-09-11 DIAGNOSIS — K5900 Constipation, unspecified: Secondary | ICD-10-CM | POA: Diagnosis not present

## 2011-09-11 DIAGNOSIS — Z87891 Personal history of nicotine dependence: Secondary | ICD-10-CM

## 2011-09-11 DIAGNOSIS — Z882 Allergy status to sulfonamides status: Secondary | ICD-10-CM

## 2011-09-11 DIAGNOSIS — R001 Bradycardia, unspecified: Secondary | ICD-10-CM | POA: Diagnosis present

## 2011-09-11 DIAGNOSIS — I2510 Atherosclerotic heart disease of native coronary artery without angina pectoris: Principal | ICD-10-CM | POA: Diagnosis present

## 2011-09-11 DIAGNOSIS — D62 Acute posthemorrhagic anemia: Secondary | ICD-10-CM | POA: Diagnosis not present

## 2011-09-11 DIAGNOSIS — G4730 Sleep apnea, unspecified: Secondary | ICD-10-CM | POA: Diagnosis present

## 2011-09-11 DIAGNOSIS — Z951 Presence of aortocoronary bypass graft: Secondary | ICD-10-CM

## 2011-09-11 DIAGNOSIS — I252 Old myocardial infarction: Secondary | ICD-10-CM

## 2011-09-11 DIAGNOSIS — I25810 Atherosclerosis of coronary artery bypass graft(s) without angina pectoris: Secondary | ICD-10-CM | POA: Diagnosis present

## 2011-09-11 DIAGNOSIS — I10 Essential (primary) hypertension: Secondary | ICD-10-CM | POA: Diagnosis present

## 2011-09-11 DIAGNOSIS — D696 Thrombocytopenia, unspecified: Secondary | ICD-10-CM | POA: Diagnosis not present

## 2011-09-11 DIAGNOSIS — L02419 Cutaneous abscess of limb, unspecified: Secondary | ICD-10-CM | POA: Diagnosis not present

## 2011-09-11 DIAGNOSIS — I255 Ischemic cardiomyopathy: Secondary | ICD-10-CM | POA: Diagnosis present

## 2011-09-11 DIAGNOSIS — I519 Heart disease, unspecified: Secondary | ICD-10-CM | POA: Diagnosis not present

## 2011-09-11 HISTORY — PX: TEE WITHOUT CARDIOVERSION: SHX5443

## 2011-09-11 HISTORY — PX: CORONARY ARTERY BYPASS GRAFT: SHX141

## 2011-09-11 LAB — POCT I-STAT 4, (NA,K, GLUC, HGB,HCT)
Glucose, Bld: 110 mg/dL — ABNORMAL HIGH (ref 70–99)
Glucose, Bld: 105 mg/dL — ABNORMAL HIGH (ref 70–99)
Glucose, Bld: 117 mg/dL — ABNORMAL HIGH (ref 70–99)
Glucose, Bld: 150 mg/dL — ABNORMAL HIGH (ref 70–99)
Glucose, Bld: 158 mg/dL — ABNORMAL HIGH (ref 70–99)
Glucose, Bld: 176 mg/dL — ABNORMAL HIGH (ref 70–99)
Glucose, Bld: 145 mg/dL — ABNORMAL HIGH (ref 70–99)
HCT: 42 % (ref 39.0–52.0)
HCT: 31 % — ABNORMAL LOW (ref 39.0–52.0)
HCT: 37 % — ABNORMAL LOW (ref 39.0–52.0)
HCT: 33 % — ABNORMAL LOW (ref 39.0–52.0)
HCT: 35 % — ABNORMAL LOW (ref 39.0–52.0)
HCT: 35 % — ABNORMAL LOW (ref 39.0–52.0)
HCT: 38 % — ABNORMAL LOW (ref 39.0–52.0)
Hemoglobin: 14.3 g/dL (ref 13.0–17.0)
Hemoglobin: 10.5 g/dL — ABNORMAL LOW (ref 13.0–17.0)
Hemoglobin: 12.6 g/dL — ABNORMAL LOW (ref 13.0–17.0)
Hemoglobin: 11.2 g/dL — ABNORMAL LOW (ref 13.0–17.0)
Hemoglobin: 11.9 g/dL — ABNORMAL LOW (ref 13.0–17.0)
Hemoglobin: 11.9 g/dL — ABNORMAL LOW (ref 13.0–17.0)
Hemoglobin: 12.9 g/dL — ABNORMAL LOW (ref 13.0–17.0)
Potassium: 4.2 mEq/L (ref 3.5–5.1)
Potassium: 4.1 mEq/L (ref 3.5–5.1)
Potassium: 4.2 mEq/L (ref 3.5–5.1)
Potassium: 4.4 mEq/L (ref 3.5–5.1)
Potassium: 4.9 mEq/L (ref 3.5–5.1)
Potassium: 4 mEq/L (ref 3.5–5.1)
Potassium: 3.9 mEq/L (ref 3.5–5.1)
Sodium: 140 mEq/L (ref 135–145)
Sodium: 138 mEq/L (ref 135–145)
Sodium: 139 mEq/L (ref 135–145)
Sodium: 137 mEq/L (ref 135–145)
Sodium: 138 mEq/L (ref 135–145)
Sodium: 138 mEq/L (ref 135–145)
Sodium: 140 mEq/L (ref 135–145)

## 2011-09-11 LAB — POCT I-STAT 3, ART BLOOD GAS (G3+)
Acid-Base Excess: 1 mmol/L (ref 0.0–2.0)
Acid-Base Excess: 1 mmol/L (ref 0.0–2.0)
Acid-Base Excess: 1 mmol/L (ref 0.0–2.0)
Acid-base deficit: 1 mmol/L (ref 0.0–2.0)
Acid-base deficit: 2 mmol/L (ref 0.0–2.0)
Bicarbonate: 27.5 mEq/L — ABNORMAL HIGH (ref 20.0–24.0)
Bicarbonate: 25.6 mEq/L — ABNORMAL HIGH (ref 20.0–24.0)
Bicarbonate: 24.8 mEq/L — ABNORMAL HIGH (ref 20.0–24.0)
Bicarbonate: 23.2 mEq/L (ref 20.0–24.0)
Bicarbonate: 26.2 mEq/L — ABNORMAL HIGH (ref 20.0–24.0)
Bicarbonate: 26.1 mEq/L — ABNORMAL HIGH (ref 20.0–24.0)
O2 Saturation: 95 %
O2 Saturation: 100 %
O2 Saturation: 99 %
O2 Saturation: 92 %
O2 Saturation: 92 %
O2 Saturation: 89 %
Patient temperature: 36
Patient temperature: 36.8
Patient temperature: 37.1
TCO2: 29 mmol/L (ref 0–100)
TCO2: 27 mmol/L (ref 0–100)
TCO2: 26 mmol/L (ref 0–100)
TCO2: 24 mmol/L (ref 0–100)
TCO2: 27 mmol/L (ref 0–100)
TCO2: 27 mmol/L (ref 0–100)
pCO2 arterial: 48.5 mmHg — ABNORMAL HIGH (ref 35.0–45.0)
pCO2 arterial: 42.2 mmHg (ref 35.0–45.0)
pCO2 arterial: 43 mmHg (ref 35.0–45.0)
pCO2 arterial: 39.1 mmHg (ref 35.0–45.0)
pCO2 arterial: 41.7 mmHg (ref 35.0–45.0)
pCO2 arterial: 41.6 mmHg (ref 35.0–45.0)
pH, Arterial: 7.362 (ref 7.350–7.450)
pH, Arterial: 7.391 (ref 7.350–7.450)
pH, Arterial: 7.368 (ref 7.350–7.450)
pH, Arterial: 7.376 (ref 7.350–7.450)
pH, Arterial: 7.405 (ref 7.350–7.450)
pH, Arterial: 7.406 (ref 7.350–7.450)
pO2, Arterial: 78 mmHg — ABNORMAL LOW (ref 80.0–100.0)
pO2, Arterial: 360 mmHg — ABNORMAL HIGH (ref 80.0–100.0)
pO2, Arterial: 147 mmHg — ABNORMAL HIGH (ref 80.0–100.0)
pO2, Arterial: 61 mmHg — ABNORMAL LOW (ref 80.0–100.0)
pO2, Arterial: 62 mmHg — ABNORMAL LOW (ref 80.0–100.0)
pO2, Arterial: 57 mmHg — ABNORMAL LOW (ref 80.0–100.0)

## 2011-09-11 LAB — CREATININE, SERUM
Creatinine, Ser: 0.74 mg/dL (ref 0.50–1.35)
GFR calc Af Amer: >90 mL/min (ref >90)
GFR calc non Af Amer: >90 mL/min (ref >90)

## 2011-09-11 LAB — CBC
HCT: 36 % — ABNORMAL LOW (ref 39.0–52.0)
Hemoglobin: 12.5 g/dL — ABNORMAL LOW (ref 13.0–17.0)
MCH: 30.4 pg (ref 26.0–34.0)
MCH: 29.9 pg (ref 26.0–34.0)
MCHC: 34.7 g/dL (ref 30.0–36.0)
MCV: 87.8 fL (ref 78.0–100.0)
MCV: 86.1 fL (ref 78.0–100.0)
Platelets: 120 10*3/uL — ABNORMAL LOW (ref 150–400)
Platelets: 109 10*3/uL — ABNORMAL LOW (ref 150–400)
RBC: 4.18 MIL/uL — ABNORMAL LOW (ref 4.22–5.81)
RDW: 13.4 % (ref 11.5–15.5)
RDW: 13.2 % (ref 11.5–15.5)
WBC: 15.1 10*3/uL — ABNORMAL HIGH (ref 4.0–10.5)

## 2011-09-11 LAB — POCT I-STAT GLUCOSE
Glucose, Bld: 111 mg/dL — ABNORMAL HIGH (ref 70–99)
Operator id: 178832

## 2011-09-11 LAB — POCT I-STAT, CHEM 8
BUN: 9 mg/dL (ref 6–23)
Calcium, Ion: 1.24 mmol/L (ref 1.13–1.30)
Chloride: 105 mEq/L (ref 96–112)
Creatinine, Ser: 0.7 mg/dL (ref 0.50–1.35)
Glucose, Bld: 161 mg/dL — ABNORMAL HIGH (ref 70–99)
HCT: 36 % — ABNORMAL LOW (ref 39.0–52.0)
Hemoglobin: 12.2 g/dL — ABNORMAL LOW (ref 13.0–17.0)
Potassium: 4.3 mEq/L (ref 3.5–5.1)
Sodium: 139 mEq/L (ref 135–145)
TCO2: 21 mmol/L (ref 0–100)

## 2011-09-11 LAB — HEMOGLOBIN AND HEMATOCRIT, BLOOD
HCT: 34.7 % — ABNORMAL LOW (ref 39.0–52.0)
Hemoglobin: 11.9 g/dL — ABNORMAL LOW (ref 13.0–17.0)

## 2011-09-11 LAB — MAGNESIUM: Magnesium: 2.5 mg/dL (ref 1.5–2.5)

## 2011-09-11 LAB — PLATELET COUNT: Platelets: 148 10*3/uL — ABNORMAL LOW (ref 150–400)

## 2011-09-11 SURGERY — REDO CORONARY ARTERY BYPASS GRAFTING (CABG)
Anesthesia: General | Site: Chest | Wound class: Clean

## 2011-09-11 MED ORDER — METOPROLOL TARTRATE 25 MG/10 ML ORAL SUSPENSION
12.5000 mg | Freq: Two times a day (BID) | ORAL | Status: DC
  Administered 2011-09-11 – 2011-09-14 (×2): 12.5 mg
  Filled 2011-09-11 (×9): qty 5

## 2011-09-11 MED ORDER — SODIUM CHLORIDE 0.45 % IV SOLN: INTRAVENOUS | Status: DC

## 2011-09-11 MED ORDER — MIDAZOLAM HCL 2 MG/2ML IJ SOLN: 2.0000 mg | INTRAMUSCULAR | Status: DC | PRN

## 2011-09-11 MED ORDER — METOPROLOL TARTRATE 1 MG/ML IV SOLN
2.5000 mg | INTRAVENOUS | Status: DC | PRN
  Administered 2011-09-12 (×2): 5 mg via INTRAVENOUS
  Filled 2011-09-11 (×2): qty 5

## 2011-09-11 MED ORDER — DEXMEDETOMIDINE HCL IN NACL 200 MCG/50ML IV SOLN
0.4000 ug/kg/h | INTRAVENOUS | Status: DC
  Filled 2011-09-11: qty 50

## 2011-09-11 MED ORDER — SODIUM CHLORIDE 0.9 % IJ SOLN
OROMUCOSAL | Status: DC | PRN
  Administered 2011-09-11 (×3): via TOPICAL

## 2011-09-11 MED ORDER — GLYCOPYRROLATE 0.2 MG/ML IJ SOLN
INTRAMUSCULAR | Status: DC | PRN
  Administered 2011-09-11 (×2): 0.1 mg via INTRAVENOUS

## 2011-09-11 MED ORDER — ASPIRIN 81 MG PO CHEW: 324.0000 mg | CHEWABLE_TABLET | Freq: Every day | ORAL | Status: DC

## 2011-09-11 MED ORDER — TRANEXAMIC ACID 100 MG/ML IV SOLN
1.5000 mg/kg/h | INTRAVENOUS | Status: DC
  Filled 2011-09-11 (×2): qty 25

## 2011-09-11 MED ORDER — DEXMEDETOMIDINE HCL IN NACL 400 MCG/100ML IV SOLN
0.1000 ug/kg/h | INTRAVENOUS | Status: DC
  Filled 2011-09-11: qty 100

## 2011-09-11 MED ORDER — BISACODYL 5 MG PO TBEC
10.0000 mg | DELAYED_RELEASE_TABLET | Freq: Every day | ORAL | Status: DC
  Administered 2011-09-12 – 2011-09-14 (×3): 10 mg via ORAL
  Filled 2011-09-11 (×3): qty 2

## 2011-09-11 MED ORDER — SODIUM CHLORIDE 0.9 % IV SOLN
INTRAVENOUS | Status: DC
  Administered 2011-09-11: 20 mL via INTRAVENOUS

## 2011-09-11 MED ORDER — DOCUSATE SODIUM 100 MG PO CAPS
200.0000 mg | ORAL_CAPSULE | Freq: Every day | ORAL | Status: DC
  Administered 2011-09-12 – 2011-09-14 (×3): 200 mg via ORAL
  Filled 2011-09-11 (×3): qty 2

## 2011-09-11 MED ORDER — ONDANSETRON HCL 4 MG/2ML IJ SOLN
4.0000 mg | Freq: Four times a day (QID) | INTRAMUSCULAR | Status: DC | PRN
  Administered 2011-09-11: 4 mg via INTRAVENOUS
  Filled 2011-09-11 (×2): qty 2

## 2011-09-11 MED ORDER — ASPIRIN EC 325 MG PO TBEC
325.0000 mg | DELAYED_RELEASE_TABLET | Freq: Every day | ORAL | Status: DC
  Administered 2011-09-12 – 2011-09-14 (×3): 325 mg via ORAL
  Filled 2011-09-11 (×4): qty 1

## 2011-09-11 MED ORDER — MIDAZOLAM HCL 5 MG/5ML IJ SOLN
INTRAMUSCULAR | Status: DC | PRN
  Administered 2011-09-11 (×2): 2 mg via INTRAVENOUS
  Administered 2011-09-11: 6 mg via INTRAVENOUS
  Administered 2011-09-11: 2 mg via INTRAVENOUS

## 2011-09-11 MED ORDER — METOPROLOL TARTRATE 12.5 MG HALF TABLET: 12.5000 mg | ORAL_TABLET | Freq: Once | ORAL | Status: DC

## 2011-09-11 MED ORDER — LACTATED RINGERS IV SOLN: 500.0000 mL | Freq: Once | INTRAVENOUS | Status: AC | PRN

## 2011-09-11 MED ORDER — FAMOTIDINE IN NACL 20-0.9 MG/50ML-% IV SOLN
20.0000 mg | Freq: Two times a day (BID) | INTRAVENOUS | Status: DC
  Administered 2011-09-11: 20 mg via INTRAVENOUS

## 2011-09-11 MED ORDER — ALBUMIN HUMAN 5 % IV SOLN: 250.0000 mL | INTRAVENOUS | Status: AC | PRN

## 2011-09-11 MED ORDER — ROCURONIUM BROMIDE 100 MG/10ML IV SOLN
INTRAVENOUS | Status: DC | PRN
  Administered 2011-09-11: 100 mg via INTRAVENOUS

## 2011-09-11 MED ORDER — MILRINONE IN DEXTROSE 200-5 MCG/ML-% IV SOLN
0.3000 ug/kg/min | INTRAVENOUS | Status: DC
  Administered 2011-09-11 – 2011-09-12 (×3): 0.3 ug/kg/min via INTRAVENOUS
  Filled 2011-09-11 (×2): qty 100

## 2011-09-11 MED ORDER — SODIUM CHLORIDE 0.9 % IV SOLN
INTRAVENOUS | Status: DC
  Administered 2011-09-11: 4.7 [IU]/h via INTRAVENOUS
  Administered 2011-09-11: 6 [IU]/h via INTRAVENOUS
  Administered 2011-09-11: 3.4 [IU]/h via INTRAVENOUS
  Filled 2011-09-11 (×2): qty 1

## 2011-09-11 MED ORDER — SODIUM CHLORIDE 0.9 % IR SOLN
Status: DC | PRN
  Administered 2011-09-11: 6000 mL

## 2011-09-11 MED ORDER — DEXMEDETOMIDINE HCL IN NACL 200 MCG/50ML IV SOLN
0.1000 ug/kg/h | INTRAVENOUS | Status: DC
  Filled 2011-09-11: qty 50

## 2011-09-11 MED ORDER — VECURONIUM BROMIDE 10 MG IV SOLR
INTRAVENOUS | Status: DC | PRN
  Administered 2011-09-11: 5 mg via INTRAVENOUS
  Administered 2011-09-11: 4 mg via INTRAVENOUS
  Administered 2011-09-11 (×2): 5 mg via INTRAVENOUS
  Administered 2011-09-11: 2 mg via INTRAVENOUS
  Administered 2011-09-11: 5 mg via INTRAVENOUS
  Administered 2011-09-11: 4 mg via INTRAVENOUS

## 2011-09-11 MED ORDER — ACETAMINOPHEN 160 MG/5ML PO SOLN
975.0000 mg | Freq: Four times a day (QID) | ORAL | Status: DC
  Administered 2011-09-11: 975 mg
  Filled 2011-09-11: qty 20.3

## 2011-09-11 MED ORDER — LACTATED RINGERS IV SOLN
INTRAVENOUS | Status: DC
  Administered 2011-09-11: 30 mL via INTRAVENOUS

## 2011-09-11 MED ORDER — METOPROLOL TARTRATE 12.5 MG HALF TABLET
12.5000 mg | ORAL_TABLET | Freq: Two times a day (BID) | ORAL | Status: DC
  Administered 2011-09-12 – 2011-09-14 (×5): 12.5 mg via ORAL
  Filled 2011-09-11 (×9): qty 1

## 2011-09-11 MED ORDER — SODIUM CHLORIDE 0.9 % IV SOLN: 250.0000 mL | INTRAVENOUS | Status: DC

## 2011-09-11 MED ORDER — HEPARIN SODIUM (PORCINE) 1000 UNIT/ML IJ SOLN
INTRAMUSCULAR | Status: DC | PRN
  Administered 2011-09-11: 26000 [IU] via INTRAVENOUS

## 2011-09-11 MED ORDER — HEPARIN SODIUM (PORCINE) 1000 UNIT/ML IJ SOLN
INTRAMUSCULAR | Status: AC
  Filled 2011-09-11: qty 3

## 2011-09-11 MED ORDER — ACETAMINOPHEN 160 MG/5ML PO SOLN: 650.0000 mg | ORAL | Status: AC

## 2011-09-11 MED ORDER — SODIUM BICARBONATE 8.4 % IV SOLN
INTRAVENOUS | Status: AC
  Administered 2011-09-11: 09:00:00
  Filled 2011-09-11: qty 2.5

## 2011-09-11 MED ORDER — OXYCODONE HCL 5 MG PO TABS
5.0000 mg | ORAL_TABLET | ORAL | Status: DC | PRN
  Administered 2011-09-12 (×2): 5 mg via ORAL
  Administered 2011-09-12 – 2011-09-15 (×11): 10 mg via ORAL
  Filled 2011-09-11 (×9): qty 2
  Filled 2011-09-11 (×2): qty 1
  Filled 2011-09-11 (×2): qty 2

## 2011-09-11 MED ORDER — EZETIMIBE-SIMVASTATIN 10-20 MG PO TABS
1.0000 | ORAL_TABLET | Freq: Every day | ORAL | Status: DC
  Administered 2011-09-12 – 2011-09-13 (×2): 1 via ORAL
  Filled 2011-09-11 (×3): qty 1

## 2011-09-11 MED ORDER — INSULIN REGULAR BOLUS VIA INFUSION
0.0000 [IU] | Freq: Three times a day (TID) | INTRAVENOUS | Status: DC
  Administered 2011-09-13 (×2): 3 [IU] via INTRAVENOUS
  Administered 2011-09-14: 2 [IU] via INTRAVENOUS
  Filled 2011-09-11: qty 10

## 2011-09-11 MED ORDER — DOPAMINE-DEXTROSE 3.2-5 MG/ML-% IV SOLN
0.0000 ug/kg/min | INTRAVENOUS | Status: DC
  Administered 2011-09-11: 3 ug/kg/min via INTRAVENOUS

## 2011-09-11 MED ORDER — MORPHINE SULFATE 2 MG/ML IJ SOLN
2.0000 mg | INTRAMUSCULAR | Status: DC | PRN
  Administered 2011-09-11 – 2011-09-12 (×8): 4 mg via INTRAVENOUS
  Administered 2011-09-12: 2 mg via INTRAVENOUS
  Administered 2011-09-12: 4 mg via INTRAVENOUS
  Administered 2011-09-12 – 2011-09-13 (×2): 2 mg via INTRAVENOUS
  Filled 2011-09-11 (×5): qty 2
  Filled 2011-09-11: qty 1
  Filled 2011-09-11 (×5): qty 2

## 2011-09-11 MED ORDER — PANTOPRAZOLE SODIUM 40 MG PO TBEC: 40.0000 mg | DELAYED_RELEASE_TABLET | Freq: Every day | ORAL | Status: DC

## 2011-09-11 MED ORDER — ACETAMINOPHEN 500 MG PO TABS
1000.0000 mg | ORAL_TABLET | Freq: Four times a day (QID) | ORAL | Status: DC
  Administered 2011-09-12 – 2011-09-15 (×12): 1000 mg via ORAL
  Filled 2011-09-11 (×18): qty 2

## 2011-09-11 MED ORDER — NITROGLYCERIN IN D5W 200-5 MCG/ML-% IV SOLN
0.0000 ug/min | INTRAVENOUS | Status: DC
  Administered 2011-09-11: 15 ug/min via INTRAVENOUS
  Filled 2011-09-11: qty 250

## 2011-09-11 MED ORDER — ACETAMINOPHEN 650 MG RE SUPP
650.0000 mg | RECTAL | Status: AC
  Administered 2011-09-11: 650 mg via RECTAL

## 2011-09-11 MED ORDER — MAGNESIUM SULFATE 40 MG/ML IJ SOLN
4.0000 g | Freq: Once | INTRAMUSCULAR | Status: AC
  Administered 2011-09-11: 4 g via INTRAVENOUS
  Filled 2011-09-11: qty 100

## 2011-09-11 MED ORDER — HEMOSTATIC AGENTS (NO CHARGE) OPTIME
TOPICAL | Status: DC | PRN
  Administered 2011-09-11: 1 via TOPICAL

## 2011-09-11 MED ORDER — SODIUM CHLORIDE 0.9 % IJ SOLN: 3.0000 mL | INTRAMUSCULAR | Status: DC | PRN

## 2011-09-11 MED ORDER — BISACODYL 10 MG RE SUPP: 10.0000 mg | Freq: Every day | RECTAL | Status: DC

## 2011-09-11 MED ORDER — VANCOMYCIN HCL IN DEXTROSE 1-5 GM/200ML-% IV SOLN
1000.0000 mg | Freq: Once | INTRAVENOUS | Status: AC
  Administered 2011-09-11: 1000 mg via INTRAVENOUS
  Filled 2011-09-11: qty 200

## 2011-09-11 MED ORDER — ALBUMIN HUMAN 5 % IV SOLN
INTRAVENOUS | Status: DC | PRN
  Administered 2011-09-11: 15:00:00 via INTRAVENOUS

## 2011-09-11 MED ORDER — MILRINONE IN DEXTROSE 200-5 MCG/ML-% IV SOLN
INTRAVENOUS | Status: DC | PRN
  Administered 2011-09-11: 0.375 ug/kg/min via INTRAVENOUS

## 2011-09-11 MED ORDER — PHENYLEPHRINE HCL 10 MG/ML IJ SOLN
0.0000 ug/min | INTRAMUSCULAR | Status: DC
  Filled 2011-09-11: qty 2

## 2011-09-11 MED ORDER — SODIUM CHLORIDE 0.9 % IJ SOLN
3.0000 mL | Freq: Two times a day (BID) | INTRAMUSCULAR | Status: DC
  Administered 2011-09-12 – 2011-09-14 (×4): 3 mL via INTRAVENOUS

## 2011-09-11 MED ORDER — MORPHINE SULFATE 2 MG/ML IJ SOLN
1.0000 mg | INTRAMUSCULAR | Status: AC | PRN
  Administered 2011-09-11: 2 mg via INTRAVENOUS
  Filled 2011-09-11: qty 1

## 2011-09-11 MED ORDER — SODIUM BICARBONATE 8.4 % IV SOLN
INTRAVENOUS | Status: AC
  Administered 2011-09-11: 14:00:00
  Filled 2011-09-11 (×2): qty 2.5

## 2011-09-11 MED ORDER — FENTANYL CITRATE 0.05 MG/ML IJ SOLN
INTRAMUSCULAR | Status: DC | PRN
  Administered 2011-09-11: 50 ug via INTRAVENOUS
  Administered 2011-09-11: 350 ug via INTRAVENOUS
  Administered 2011-09-11: 50 ug via INTRAVENOUS
  Administered 2011-09-11: 100 ug via INTRAVENOUS
  Administered 2011-09-11: 50 ug via INTRAVENOUS
  Administered 2011-09-11: 100 ug via INTRAVENOUS
  Administered 2011-09-11: 150 ug via INTRAVENOUS
  Administered 2011-09-11: 100 ug via INTRAVENOUS
  Administered 2011-09-11: 150 ug via INTRAVENOUS
  Administered 2011-09-11: 100 ug via INTRAVENOUS

## 2011-09-11 MED ORDER — PROPOFOL 10 MG/ML IV EMUL
INTRAVENOUS | Status: DC | PRN
  Administered 2011-09-11: 150 mg via INTRAVENOUS

## 2011-09-11 MED ORDER — POTASSIUM CHLORIDE 10 MEQ/50ML IV SOLN
10.0000 meq | INTRAVENOUS | Status: AC
  Administered 2011-09-11 (×3): 10 meq via INTRAVENOUS

## 2011-09-11 MED ORDER — DEXTROSE 5 % IV SOLN
1.5000 g | Freq: Two times a day (BID) | INTRAVENOUS | Status: AC
  Administered 2011-09-11 – 2011-09-13 (×4): 1.5 g via INTRAVENOUS
  Filled 2011-09-11 (×4): qty 1.5

## 2011-09-11 MED ORDER — PROTAMINE SULFATE 10 MG/ML IV SOLN
INTRAVENOUS | Status: DC | PRN
  Administered 2011-09-11: 180 mg via INTRAVENOUS

## 2011-09-11 MED ORDER — LACTATED RINGERS IV SOLN
INTRAVENOUS | Status: DC | PRN
  Administered 2011-09-11 (×3): via INTRAVENOUS

## 2011-09-11 SURGICAL SUPPLY — 116 items
ADAPTER CARDIO PERF ANTE/RETRO (ADAPTER) ×2 IMPLANT
ADPR PRFSN 84XANTGRD RTRGD (ADAPTER) ×2
APPLIER CLIP 9.375 MED OPEN (MISCELLANEOUS)
APPLIER CLIP 9.375 SM OPEN (CLIP)
APR CLP MED 9.3 20 MLT OPN (MISCELLANEOUS)
APR CLP SM 9.3 20 MLT OPN (CLIP)
ATTRACTOMAT 16X20 MAGNETIC DRP (DRAPES) ×3 IMPLANT
BAG DECANTER FOR FLEXI CONT (MISCELLANEOUS) ×5 IMPLANT
BANDAGE ELASTIC 4 VELCRO ST LF (GAUZE/BANDAGES/DRESSINGS) ×5 IMPLANT
BANDAGE ELASTIC 6 VELCRO ST LF (GAUZE/BANDAGES/DRESSINGS) ×5 IMPLANT
BANDAGE GAUZE ELAST BULKY 4 IN (GAUZE/BANDAGES/DRESSINGS) ×5 IMPLANT
BLADE CORE FAN STRYKER (BLADE) ×4 IMPLANT
BLADE OSCILLATING /SAGITTAL (BLADE) ×3 IMPLANT
BLADE STERNUM SYSTEM 6 (BLADE) ×1 IMPLANT
BLADE SURG 11 STRL SS (BLADE) IMPLANT
BLADE SURG 15 STRL LF DISP TIS (BLADE) IMPLANT
BLADE SURG 15 STRL SS (BLADE)
BLADE SURG ROTATE 9660 (MISCELLANEOUS) ×2 IMPLANT
CANISTER SUCTION 2500CC (MISCELLANEOUS) ×3 IMPLANT
CANN PRFSN .5XCNCT 15X34-48 (MISCELLANEOUS) ×2
CANNULA GUNDRY RCSP 15FR (MISCELLANEOUS) ×2 IMPLANT
CANNULA PRFSN .5XCNCT 15X34-48 (MISCELLANEOUS) ×2 IMPLANT
CANNULA VEN 2 STAGE (MISCELLANEOUS) ×3
CANNULA VESSEL W/WING W/VALVE (CANNULA) ×2 IMPLANT
CANNULA VESSEL W/WING WO/VALVE (CANNULA) IMPLANT
CATH CPB KIT GERHARDT (MISCELLANEOUS) ×3 IMPLANT
CATH RETROPLEGIA CORONARY 14FR (CATHETERS) IMPLANT
CATH THORACIC 28FR (CATHETERS) ×3 IMPLANT
CLIP APPLIE 9.375 MED OPEN (MISCELLANEOUS) IMPLANT
CLIP APPLIE 9.375 SM OPEN (CLIP) IMPLANT
CLIP FOGARTY SPRING 6M (CLIP) IMPLANT
CLIP RETRACTION 3.0MM CORONARY (MISCELLANEOUS) ×2 IMPLANT
CLIP TI MEDIUM 24 (CLIP) IMPLANT
CLIP TI WIDE RED SMALL 24 (CLIP) IMPLANT
CLOTH BEACON ORANGE TIMEOUT ST (SAFETY) ×6 IMPLANT
CONN Y 3/8X3/8X3/8  BEN (MISCELLANEOUS)
CONN Y 3/8X3/8X3/8 BEN (MISCELLANEOUS) IMPLANT
COVER MAYO STAND STRL (DRAPES) ×2 IMPLANT
COVER SURGICAL LIGHT HANDLE (MISCELLANEOUS) ×6 IMPLANT
CRADLE DONUT ADULT HEAD (MISCELLANEOUS) ×3 IMPLANT
DRAIN CHANNEL 28F RND 3/8 FF (WOUND CARE) ×3 IMPLANT
DRAIN CHANNEL 32F RND 10.7 FF (WOUND CARE) IMPLANT
DRAPE CARDIOVASCULAR INCISE (DRAPES) ×3
DRAPE EXTREMITY T 121X128X90 (DRAPE) IMPLANT
DRAPE PROXIMA HALF (DRAPES) IMPLANT
DRAPE SLUSH/WARMER DISC (DRAPES) ×2 IMPLANT
DRAPE SRG 135X102X78XABS (DRAPES) ×2 IMPLANT
DRSG COVADERM 4X14 (GAUZE/BANDAGES/DRESSINGS) ×3 IMPLANT
ELECT BLADE 4.0 EZ CLEAN MEGAD (MISCELLANEOUS) ×3
ELECT CAUTERY BLADE 6.4 (BLADE) ×3 IMPLANT
ELECT REM PT RETURN 9FT ADLT (ELECTROSURGICAL) ×6
ELECTRODE BLDE 4.0 EZ CLN MEGD (MISCELLANEOUS) ×2 IMPLANT
ELECTRODE REM PT RTRN 9FT ADLT (ELECTROSURGICAL) ×4 IMPLANT
GEL ULTRASOUND 20GR AQUASONIC (MISCELLANEOUS) IMPLANT
GLOVE BIO SURGEON STRL SZ 6.5 (GLOVE) ×15 IMPLANT
GLOVE BIO SURGEON STRL SZ7.5 (GLOVE) ×8 IMPLANT
GLOVE BIOGEL PI IND STRL 6.5 (GLOVE) ×7 IMPLANT
GLOVE BIOGEL PI IND STRL 7.0 (GLOVE) ×2 IMPLANT
GLOVE BIOGEL PI INDICATOR 6.5 (GLOVE) ×7
GLOVE BIOGEL PI INDICATOR 7.0 (GLOVE) ×2
GOWN STRL NON-REIN LRG LVL3 (GOWN DISPOSABLE) ×26 IMPLANT
HARMONIC SHEARS 14CM COAG (MISCELLANEOUS) ×3 IMPLANT
HEMOSTAT POWDER SURGIFOAM 1G (HEMOSTASIS) ×6 IMPLANT
HEMOSTAT SURGICEL 2X14 (HEMOSTASIS) ×2 IMPLANT
INSERT FOGARTY XLG (MISCELLANEOUS) IMPLANT
KIT BASIN OR (CUSTOM PROCEDURE TRAY) ×3 IMPLANT
KIT PAIN CUSTOM (MISCELLANEOUS) IMPLANT
KIT ROOM TURNOVER OR (KITS) ×6 IMPLANT
KIT SUCTION CATH 14FR (SUCTIONS) ×6 IMPLANT
KIT VASOVIEW W/TROCAR VH 2000 (KITS) ×3 IMPLANT
LEAD PACING MYOCARDI (MISCELLANEOUS) ×3 IMPLANT
MARKER GRAFT CORONARY BYPASS (MISCELLANEOUS) ×9 IMPLANT
NS IRRIG 1000ML POUR BTL (IV SOLUTION) ×16 IMPLANT
PACK OPEN HEART (CUSTOM PROCEDURE TRAY) ×3 IMPLANT
PAD ARMBOARD 7.5X6 YLW CONV (MISCELLANEOUS) ×12 IMPLANT
PAD DEFIB R2 (MISCELLANEOUS) ×2 IMPLANT
PENCIL BUTTON HOLSTER BLD 10FT (ELECTRODE) ×3 IMPLANT
PUNCH AORTIC ROTATE 4.0MM (MISCELLANEOUS) ×2 IMPLANT
PUNCH AORTIC ROTATE 4.5MM 8IN (MISCELLANEOUS) IMPLANT
PUNCH AORTIC ROTATE 5MM 8IN (MISCELLANEOUS) IMPLANT
SOLUTION ANTI FOG 6CC (MISCELLANEOUS) ×3 IMPLANT
SPONGE GAUZE 4X4 12PLY (GAUZE/BANDAGES/DRESSINGS) ×8 IMPLANT
SPONGE LAP 18X18 X RAY DECT (DISPOSABLE) ×8 IMPLANT
SUT BONE WAX W31G (SUTURE) ×3 IMPLANT
SUT PROLENE 3 0 SH 1 (SUTURE) ×9 IMPLANT
SUT PROLENE 4 0 RB 1 (SUTURE) ×3
SUT PROLENE 4 0 TF (SUTURE) ×6 IMPLANT
SUT PROLENE 4-0 RB1 .5 CRCL 36 (SUTURE) ×1 IMPLANT
SUT PROLENE 5 0 C 1 36 (SUTURE) ×4 IMPLANT
SUT PROLENE 6 0 CC (SUTURE) ×6 IMPLANT
SUT PROLENE 7 0 BV1 MDA (SUTURE) ×5 IMPLANT
SUT PROLENE 7.0 RB 3 (SUTURE) ×4 IMPLANT
SUT PROLENE 8 0 BV175 6 (SUTURE) ×14 IMPLANT
SUT SILK 2 0 SH CR/8 (SUTURE) ×2 IMPLANT
SUT STEEL 6MS V (SUTURE) ×3 IMPLANT
SUT STEEL STERNAL CCS#1 18IN (SUTURE) IMPLANT
SUT STEEL SZ 6 DBL 3X14 BALL (SUTURE) ×3 IMPLANT
SUT VIC AB 1 CTX 18 (SUTURE) ×6 IMPLANT
SUT VIC AB 2-0 CT1 27 (SUTURE) ×6
SUT VIC AB 2-0 CT1 TAPERPNT 27 (SUTURE) ×2 IMPLANT
SUT VIC AB 2-0 CTX 27 (SUTURE) IMPLANT
SUT VIC AB 3-0 SH 27 (SUTURE)
SUT VIC AB 3-0 SH 27X BRD (SUTURE) IMPLANT
SUT VIC AB 3-0 X1 27 (SUTURE) ×4 IMPLANT
SUTURE E-PAK OPEN HEART (SUTURE) ×3 IMPLANT
SYR 50ML SLIP (SYRINGE) IMPLANT
SYSTEM SAHARA CHEST DRAIN ATS (WOUND CARE) ×3 IMPLANT
TAPE CLOTH SURG 4X10 WHT LF (GAUZE/BANDAGES/DRESSINGS) ×2 IMPLANT
TOWEL OR 17X24 6PK STRL BLUE (TOWEL DISPOSABLE) ×7 IMPLANT
TOWEL OR 17X26 10 PK STRL BLUE (TOWEL DISPOSABLE) ×7 IMPLANT
TRAY FOLEY IC TEMP SENS 14FR (CATHETERS) ×3 IMPLANT
TUBE FEEDING 8FR 16IN STR KANG (MISCELLANEOUS) ×3 IMPLANT
TUBE SUCT INTRACARD DLP 20F (MISCELLANEOUS) ×3 IMPLANT
TUBING INSUFFLATION 10FT LAP (TUBING) ×3 IMPLANT
UNDERPAD 30X30 INCONTINENT (UNDERPADS AND DIAPERS) ×3 IMPLANT
WATER STERILE IRR 1000ML POUR (IV SOLUTION) ×6 IMPLANT

## 2011-09-11 NOTE — Preoperative (Signed)
Beta Blockers   Reason not to administer Beta Blockers:Not Applicable 

## 2011-09-11 NOTE — Anesthesia Preprocedure Evaluation (Addendum)
Anesthesia Evaluation  Patient identified by MRN, date of birth, ID band Patient awake    Reviewed: Allergy & Precautions, H&P , NPO status , Patient's Chart, lab work & pertinent test results, reviewed documented beta blocker date and time   Airway Mallampati: III      Dental  (+) Teeth Intact   Pulmonary  breath sounds clear to auscultation        Cardiovascular Rhythm:Regular Rate:Normal     Neuro/Psych    GI/Hepatic   Endo/Other    Renal/GU      Musculoskeletal   Abdominal (+) + obese,   Peds  Hematology   Anesthesia Other Findings   Reproductive/Obstetrics                          Anesthesia Physical Anesthesia Plan  ASA: III  Anesthesia Plan: General   Post-op Pain Management:    Induction: Intravenous  Airway Management Planned: Oral ETT  Additional Equipment: Arterial line, PA Cath, CVP, TEE and Ultrasound Guidance Line Placement  Intra-op Plan:   Post-operative Plan: Post-operative intubation/ventilation  Informed Consent: I have reviewed the patients History and Physical, chart, labs and discussed the procedure including the risks, benefits and alternatives for the proposed anesthesia with the patient or authorized representative who has indicated his/her understanding and acceptance.   Dental advisory given  Plan Discussed with: CRNA and Surgeon  Anesthesia Plan Comments: (Plan GA with TEE  Kipp Brood)       Anesthesia Quick Evaluation

## 2011-09-11 NOTE — Procedures (Signed)
Extubation Procedure Note  Patient Details:   Name: Nicholas Lynn DOB: 04-06-1945 MRN: [id number]       Evaluation  O2 sats: stable throughout Complications: No apparent complications Patient did tolerate procedure well. Bilateral Breath Sounds: Clear;Diminished   Pt able to vocalize: Yes  NIF -30, FVC .950. Pt able to breath around cuff. ABG WNL. Pt extubated to 4L [place] with no complications. No stridor. Pt oriented to place.  Fredrich Birks 09/11/2011, 8:33 PM

## 2011-09-11 NOTE — H&P (Signed)
[address]            [place] [address]          [phone]                   SEANPAUL PREECE [facility] Medical Record #[id number] Date of Birth: February 22, 1958  Referring: Dr Herbie Baltimore Primary Care: Maryelizabeth Rowan, MD  Chief Complaint:    Angina   History of Present Illness:    Patient had cabg x4  02/22/1994 . Lima to LAD, vein to om2 and distal cirx and PDA. He has done well until two weeks ago.    (Cath in 10/8 - LIMA-LAD, SVG-OM patent; occluded SVG-RCA with L-R collaterals, 50% SVG-OM lesion; EF 40-45%)  He tolerated recent Umbilical Hernia repair in 3/13, but after having his BB dose decreased due to bradycardia in July 2013, he began to note typical exertional angina relieved by rest. He was evaluated with a Myoview ST on 08/30/11 with that demonstrated High -Risk ischemia in the LCx distribution that would be worrisome for jeopardized L-R collaterals. He had anterior MI in 1988 and treated with angioplasty.  EF was noted to be 40% in 1996  Current Activity/ Functional Status: Patient is} independent with mobility/ambulation, transfers, ADL's, IADL's.   Past Medical History  Diagnosis Date  . Hyperlipidemia   . Heart disease   . Hypertension   . FH: CABG (coronary artery bypass surgery)   . Hernia     umbilical  . GERD (gastroesophageal reflux disease)   . Wears glasses   . Allergy   . Hearing loss   . Arthritis   . Coronary artery disease   . Dysrhythmia     OCCASIONAL PALPITATION  . Heart attack 1988  . Shortness of breath 09/01/2011    recent  . Sleep apnea     USES C-PAP  . Headache     when takes Imdur    Past Surgical History  Procedure Date  . Angioplasty   . Arterial bypass surgry   . Cardiac catheterization   . Coronary artery bypass graft 1996    3 VESSELS  . Cataract extraction   . Umbilical hernia repair 04/04/2011    Procedure: HERNIA REPAIR UMBILICAL ADULT;  Surgeon: Adolph Pollack, MD;  Location: [facility]  ORS;  Service: General;  Laterality: N/A;  Umbilical Hernia Repair    Family History  Problem Relation Age of Onset  . Stroke Mother   . Cancer Father     lung  . Cancer Sister     lung  . Cancer Sister     liver    History   Social History  . Marital Status: Married    Spouse Name: N/A    Number of Children: N/A  . Years of Education: N/A   Occupational History  . Not on file.   Social History Main Topics  . Smoking status: Former Smoker    Quit date: 03/27/1986  . Smokeless tobacco: Never Used  . Alcohol Use: No  . Drug Use: No  . Sexually Active: Not on file      History  Smoking status  . Former Smoker  . Quit date: 03/27/1986  Smokeless tobacco  . Never Used    History  Alcohol Use No     Allergies  Allergen Reactions  . Sulfur Swelling    Causes all  joints to swell. Unable to bend them.    Current Facility-Administered Medications  Medication Dose Route Frequency Provider Last Rate Last Dose  . cefUROXime (ZINACEF) 1.5 g in dextrose 5 % 50 mL IVPB  1.5 g Intravenous To OR Delight Ovens, MD      . cefUROXime (ZINACEF) 750 mg in dextrose 5 % 50 mL IVPB  750 mg Intravenous To OR Delight Ovens, MD      . chlorhexidine (HIBICLENS) 4 % liquid 2 application  30 mL Topical UD Delight Ovens, MD      . dexmedetomidine (PRECEDEX) 400 mcg / 100 mL infusion  0.1-0.7 mcg/kg/hr Intravenous To OR Delight Ovens, MD      . DOPamine (INTROPIN) 800 mg in dextrose 5 % 250 mL infusion  2-20 mcg/kg/min Intravenous To OR Delight Ovens, MD      . EPINEPHrine (ADRENALIN) 4,000 mcg in dextrose 5 % 250 mL infusion  0.5-20 mcg/min Intravenous To OR Delight Ovens, MD      . insulin regular (NOVOLIN R,HUMULIN R) 1 Units/mL in sodium chloride 0.9 % 100 mL infusion   Intravenous To OR Delight Ovens, MD      . magnesium sulfate (IV Push/IM) injection 40 mEq  40 mEq Other To OR Delight Ovens, MD      . metoprolol tartrate (LOPRESSOR) tablet 12.5 mg   12.5 mg Oral Once Delight Ovens, MD      . nitroGLYCERIN 0.2 mg/mL in dextrose 5 % infusion  2-200 mcg/min Intravenous To OR Delight Ovens, MD      . nitroglycerin-nicardipine-HEPARIN-sodium bicarbonate irrigation for artery spasm   Irrigation To OR Delight Ovens, MD      . phenylephrine (NEO-SYNEPHRINE) 20,000 mcg in dextrose 5 % 250 mL infusion  30-200 mcg/min Intravenous To OR Delight Ovens, MD      . potassium chloride injection 80 mEq  80 mEq Other To OR Delight Ovens, MD      . tranexamic acid (CYKLOKAPRON) bolus via infusion - over 30 minutes 1,852.5 mg  15 mg/kg Intravenous To OR Delight Ovens, MD      . tranexamic acid (CYKLOKAPRON) pump prime solution 247 mg  2 mg/kg Intracatheter To OR Delight Ovens, MD      . tranexamic acid (CYKLOLAPRON) 2,500 mg in sodium chloride 0.9 % 250 mL infusion  1.5 mg/kg/hr Intravenous To OR Delight Ovens, MD      . vancomycin (VANCOCIN) 1,500 mg in sodium chloride 0.9 % 250 mL IVPB  1,500 mg Intravenous To OR Delight Ovens, MD       Facility-Administered Medications Ordered in Other Encounters  Medication Dose Route Frequency Provider Last Rate Last Dose  . fentaNYL (SUBLIMAZE) injection    PRN Quentin Ore, CRNA   50 mcg at 09/11/11 0710  . midazolam (VERSED) 5 MG/5ML injection    PRN Quentin Ore, CRNA   2 mg at 09/11/11 0700       Review of Systems:     Cardiac Review of Systems: Y or N  Chest Pain [ y   ]  Resting SOB [   ] Exertional SOB  Cove.Etienne  ]  Orthopnea [ n ]   Pedal Edema [ n  ]    Palpitations [ y for years ] Syncope  [ n ]   Presyncope [ n  ]  General Review of Systems: [Y] = yes [  ]=no  Constitional: recent weight change [  ]; anorexia [  ]; fatigue [  ]; nausea [  ]; night sweats [  ]; fever [  ]; or chills [  ];                                                                                                                                          Dental: poor dentition[n  ]; Last Dentist visit:  Eye :  blurred vision [  ]; diplopia [   ]; vision changes [  ];  Amaurosis fugax[  ]; Resp: cough [  ];  wheezing[  ];  hemoptysis[  ]; shortness of breath[  ]; paroxysmal nocturnal dyspnea[  ]; dyspnea on exertion[  ]; or orthopnea[  ];  GI:  gallstones[  ], vomiting[  ];  dysphagia[  ]; melena[  ];  hematochezia [  ]; heartburn[  ];   Hx of  Colonoscopy[ y ]; GU: kidney stones [  ]; hematuria[  ];   dysuria [  ];  nocturia[  ];  history of     obstruction [  ];             Skin: rash, swelling[  ];, hair loss[  ];  peripheral edema[  ];  or itching[  ]; Musculosketetal: myalgias[  ];  joint swelling[  ];  joint erythema[  ];  joint pain[  ];  back pain[  ];  Heme/Lymph: bruising[  ];  bleeding[  ];  anemia[  ];  Neuro: TIA[ n ];  headaches[  ];  stroke[  ];  vertigo[  ];  seizures[  ];   paresthesias[  ];  difficulty walking[  ];  Psych:depression[  ]; anxiety[  ];  Endocrine: diabetes[ nn ];  thyroid dysfunction[n  ];  Immunizations: Flu Patrice.Shin  ]; Pneumococcal[y  ];  Other:  Physical Exam: BP 132/74  Pulse 50  Temp 98.4 F (36.9 C) (Oral)  Resp 18  Wt 272 lb 4.8 oz (123.514 kg)  SpO2 97%  General appearance: alert, cooperative and appears stated age Neurologic: intact Heart: regular rate and rhythm, S1, S2 normal, no murmur, click, rub or gallop and normal apical impulse Lungs: clear to auscultation bilaterally and normal percussion bilaterally Abdomen: soft, non-tender; bowel sounds normal; no masses,  no organomegaly Extremities: extremities normal, atraumatic, no cyanosis or edema, Homans sign is negative, no sign of DVT, no ulcers, gangrene or trophic changes and vein harvest fromrt lower leg Wound: sternum stable and well healed no cartid bruits,not palpable AAA   Diagnostic Studies & Laboratory data:     Recent Radiology Findings:  Dg Chest 2 View  09/08/2011  *RADIOLOGY REPORT*  Clinical Data: Pre cardiac surgery evaluation.  Gastroesophageal reflux disease, coronary artery  disease, hypertension and shortness of breath  CHEST - 2 VIEW  Comparison: 03/27/2011  Findings: The patient is status post median  sternotomy and CABG. Stable mild cardiac enlargement and aortic ectasia is noted. Some stable prominence of the interstitial markings are seen compatible with underlying bronchitic change.  The lung fields appear otherwise clear with no signs of focal infiltrate or congestive failure. Mild bilateral pleural density is seen and is stable likely representing extrapleural fat.  No pleural fluid or significant peribronchial cuffing is seen.  Bony structures demonstrate degenerative changes of the mid and lower thoracic spine and are otherwise intact.  IMPRESSION: Stable cardiopulmonary appearance with no new focal or acute abnormality suggested  Original Report Authenticated By: Bertha Stakes, M.D.   Recent Lab Findings: Lab Results  Component Value Date   WBC 8.0 09/08/2011   HGB 14.9 09/08/2011   HCT 43.0 09/08/2011   PLT 159 09/08/2011   GLUCOSE 92 09/08/2011   ALT 16 09/08/2011   AST 19 09/08/2011   NA 139 09/08/2011   K 4.6 09/08/2011   CL 104 09/08/2011   CREATININE 0.91 09/08/2011   BUN 13 09/08/2011   CO2 25 09/08/2011   INR 1.00 09/08/2011   HGBA1C 6.1* 09/08/2011      Hemodynamics:  Central Aortic Pressure / Mean Aortic Pressure: 120/57 mmHg; 79 mmHg  LV Pressure / LV End diastolic Pressure: 124/17 mmHg; 19 mmHg  Left Ventriculography:  EF: 40-45%  Wall Motion: Inferior hypokinesis > global hypokinesis.  Coronary Angiographic Data:  Left Main: Large caliber vessel that bifurcates into the Left Circumflex and LAD; angiographically normal  Left Anterior Descending (LAD): Mild to moderate diffuse proximal disease, then 100% occluded after given off a significant Septal Trunk and a moderate caliber 1st Diagonal branch that has ~50% ostial stenosis followed by diffuse luminal irregularities.  The Mid-Distal LAD is filled via the LIMA graft -- retrograde fills to a  Diagonal that is likely #2, anterograde fills a large caliber vessel that wraps the apex giving off several small septal branches as well as an apical diagonal all of which provide extensive collateral circulation to the RPDA filling back to the RCA bifurcation with slight RPL system flow.  Circumflex (LCx): Moderate to large caliber vessel that gives off a proximal moderate caliber OM1 with mild luminal irregularities. The vessel is then 100% occluded after this vessel.  ~OM2 and OM3 (LPL1) fill via a Sequential SVG With some retrograde filling to the Native AVGroove LCx via the OM3, minimal R-L collaterals noted, perhaps due to the significant SVG disease.  Right Coronary Artery: 100 % occluded after the SA Nodal artery.  Right Posterior Descending Artery: fills via L-R collaterals Grafts  LIMA - LAD: Widely patent  SVG - OM2-OM3: Large caliber graft with proximal ~50-60^ eccentric lesion followed by multiple focal lesions in the mid vessel -- 70% eccentric followed by a ~80-90% then 95% lesion, then a ~50% lesion more distally with diffuse irregularities in the most distal segment. The Sequential limb is widely patent.  SVG - RCA: 100% occluded at the Aorto-Ostium. Impression:  Severe Native CAD as previously described along with known SCG-RCA occlusion, now complicated by Severe diffusely diseased 66 yr old SVG-OM2-3 that corresponds well to the ischemic distribution noted on the Myoview.  This graft is not a good PCI target as it would require extensive stenting and would not be easy to pass a distal protection device beyond the multiple lesions, making the risk of No-Reflow extremely high.  Extensive collateralization to the RPDA.  Moderately reduced LVEF with borderline EDP  Dopplers: [facility]* *[facility]* [address] [place], [place] [address] [phone]  ------------------------------------------------------------ Noninvasive  Vascular Lab  Preoperative Vascular Evaluation  Patient: Asuncion, Shibata MR #: [id number] Study Date: 09/08/2011 Gender: M Age: 9 Height: Weight: BSA: Pt. Status: Room:  [place] R SONOGRAPHER Reino Kent, RVT Reports also to:  ------------------------------------------------------------ History and indications:  Indications  V72.81 Screening Pre-operative for redo CABG. 414.01 CAD.  ------------------------------------------------------------ Study information:  Study status: Routine. Procedure: A vascular evaluation was performed. Image quality was adequate. Technically difficult study due to the patient's thick neck. Preoperative vascular evaluation for heart surgery. Carotid duplex exam per standard extablished protocols, limited upper extremity arterial evaluation and ABIs as indicated. Location: Vascular laboratory. Patient status: Outpatient.  Arterial flow:  +------------+--------+-------+-----------+----------------+ Location V sys V ed Flow Comment     analysis   +------------+--------+-------+-----------+----------------+ Right CCA - 106cm/s 14cm/s --------------------------- proximal      +------------+--------+-------+-----------+----------------+ Right CCA - -99cm/s -21cm/s--------------------------- distal      +------------+--------+-------+-----------+----------------+ Right ECA -131cm/s-9cm/s --------------------------- +------------+--------+-------+-----------+----------------+ Right ICA - -71cm/s -13cm/s--------------------------- proximal      +------------+--------+-------+-----------+----------------+ Right ICA - -99cm/s -20cm/s--------------------------- distal      +------------+--------+-------+-----------+----------------+ Right 60cm/s 10cm/s --------------------------- vertebral       +------------+--------+-------+-----------+----------------+ Left CCA - 131cm/s 19cm/s --------------------------- proximal      +------------+--------+-------+-----------+----------------+ Left CCA - -113cm/s-23cm/s--------------------------- distal      +------------+--------+-------+-----------+----------------+ Left ECA -97cm/s -15cm/s--------------------------- +------------+--------+-------+-----------+----------------+ Left ICA - -94cm/s -21cm/s-----------Mild,  proximal    homogeneous      plaque.  +------------+--------+-------+-----------+----------------+ Left ICA - -81cm/s -22cm/s--------------------------- distal      +------------+--------+-------+-----------+----------------+ Left 56cm/s 10cm/s --------------------------- vertebral      +------------+--------+-------+-----------+----------------+ Right 128cm/s -------Triphasic ---------------- brachial   waveform   +------------+--------+-------+-----------+----------------+ Right radial---------------Triphasic ----------------    waveform   +------------+--------+-------+-----------+----------------+ Right ulnar ---------------Triphasic ----------------    waveform   +------------+--------+-------+-----------+----------------+ Right palmar--------------------------Doppler  arch    waveforms remain     normal with      radial and ulnar     compressions.  +------------+--------+-------+-----------+----------------+ Left 123cm/s -------Triphasic ---------------- brachial   waveform   +------------+--------+-------+-----------+----------------+ Left radial ---------------Triphasic ----------------    waveform   +------------+--------+-------+-----------+----------------+ Left ulnar ---------------Monophasic ----------------    waveform    +------------+--------+-------+-----------+----------------+ Left palmar --------------------------Doppler  arch    waveforms      obliterate with      radial and      remains normal      with ulnar      compressions.  +------------+--------+-------+-----------+----------------+  ------------------------------------------------------------ Summary: No significant extracranial carotid artery stenosis demonstrated. Vertebrals are patent with antegrade flow. ICA/CCA ratio is 0.72 on the right and 0.83 on the left. Palpable pedal pulses x 4. Prepared and Electronically Authenticated by  Sherren Kerns 2013-08-17T13:33:47.603  No evidence of left subclavian stenosis by doppler study   Assessment / Plan:      With patiens symptoms and severely diseased vein graft to cix, redo cabg has been recommended Plan redo CABG Monday  today  The goals risks and alternatives of the planned surgical procedure redo cabg have been discussed with the patient in detail. The risks of the procedure including death, infection, stroke, myocardial infarction, bleeding, blood transfusion have all been discussed specifically.  I have quoted [name] a 8% of perioperative mortality and a complication rate as high as 25 %. The patient's questions have been answered.ELIJA MCCAMISH is willing  to proceed with the planned procedure.   Delight Ovens MD  Beeper [phone] Office [phone] 09/11/2011 7:17 AM

## 2011-09-11 NOTE — CV Procedure (Signed)
Intraoperative Transesophageal Echocardiography Report:  [name] is a 66 year old male with a history of previous CABG x4 in January 1996. He recently developed exertional angina and was found to have high risk ischemia in the left circumflex distribution. He is now scheduled for redo coronary artery bypass grafting by Dr. Tyrone Sage. Intraoperative transesophageal echocardiography was requested to evaluate the right and left ventricular function, to assess for any valvular pathology, and to serve as a monitor for intraoperative volume status.  The patient was brought to the operating room at [facility] and general anesthesia was induced without difficulty. Following endotracheal intubation and orogastric suctioning, the transesophageal echocardiography probe was inserted into the esophagus without difficulty.  Impression: Pre-bypass findings:  1. Aortic Valve: The aortic valve was trileaflet. The leaflets were thin and pliable and opened normally. There was no aortic insufficiency.  2. Mitral Valve: There was trace mitral insufficiency. The leaflets coapted normally and there was no flail or prolapsing  segments noted. The leaflets opened normally.  3. Left Ventricle: There was moderate left ventricular dysfunction. The basilar and mid-papillary regions of the left ventricle appeared to contract adequately, but the apex and distal anterior wall appeared akinetic thinned and scarred. There was no thrombus noted in the left ventricular apex that could be appreciated. There was moderate left ventricular hypertrophy concentrically at the mid-papillary and basilar regions. Left ventricular wall thickness measured 1.25-1.35 cm at the end diastole at the mid-papillary level. Left ventricular end-diastolic diameter was 53 mm at the mid-papillary level. Ejection fraction was estimated at 40%.  4. Right Ventricle: The right ventricular cavity was of normal size. There was normal contractility  the right ventricular free wall.  5. Tricuspid Valve: The tricuspid valve appeared structurally normal and there was trace tricuspid insufficiency.  6. Interatrial Septum: The interatrial septum was intact without evidence of patent foramen ovale or atrial septal defect by color Doppler or bubble study.  7. Left Atrium: There is no thrombus noted in the left atrium or left atrial appendage.  8. Ascending Aorta: The ascending aorta was mildly thickened but nonaneurysmal. There was a well-defined aortic root and sinotubular ridge which were non-effaced.  9. Descending aorta: The descending aorta was non-aneurysmal and measured 2.69 cm in diameter. There was no significant atheromatous disease appreciated in the descending aorta.  Post-bypass Findings:  1. Aortic Valve: The aortic valve appeared normal and was unchanged from the pre-bypass study.  2. Mitral Valve: There was trace mitral insufficiency which appeared unchanged from the pre-bypass study. The mitral valve appeared structurally unchanged from the pre-bypass study.  3. Left Ventricle: There was vigorous contractility noted in the mid-papillary and basilar regions which was improved from the pre-bypass study. The apex and distal anterior wall again appeared akinetic. The ejection fraction appeared improved and was estimated at 45-50%.  4. Right Ventricle: The right ventricular cavity appeared normal in size with normal contractility of the right ventricular free wall.  5. Tricuspid Valve: There was trace tricuspid insufficiency which is unchanged from the pre-bypass study.  Kipp Brood M.D.

## 2011-09-11 NOTE — Brief Op Note (Addendum)
                   [address]            [place] [address]          [phone]    09/11/2011  2:11 PM  PATIENT:  [name]  66 y.o. male  PRE-OPERATIVE DIAGNOSIS:  CAD  POST-OPERATIVE DIAGNOSIS:  Coronary artery disease  PROCEDURE:  Procedure(s): REDO CORONARY ARTERY BYPASS GRAFTING (CABG)X 3 FREE RIMA-LAD; SVG-OM2; SVG-RAMUS TRANSESOPHAGEAL ECHOCARDIOGRAM (TEE) EVH RIGHT AND LEFT THIGH  SURGEON:  Surgeon(s): Delight Ovens, MD  PHYSICIAN ASSISTANT: WAYNE GOLD PA-C  ANESTHESIA:   general  PATIENT CONDITION:  ICU - intubated and hemodynamically stable.  PRE-OPERATIVE WEIGHT: 123kg  COMPLICATIONS: NO KNOWN   Expected Acute  Blood - loss Anemia, no blood transfusion given intra op  Rt chest tube and mediastinal drains in place  Hold any heparin/lovenox secondary to risk of bleeding

## 2011-09-11 NOTE — Progress Notes (Signed)
Day of Surgery Procedure(s) (LRB): REDO CORONARY ARTERY BYPASS GRAFTING (CABG) (N/A) TRANSESOPHAGEAL ECHOCARDIOGRAM (TEE) (N/A) Subjective: Redo CABG w/ free RIMA Hemodynamics stable  Labs reviewed Objective: Vital signs in last 24 hours: Temp:  [96.8 F (36 C)-98.4 F (36.9 C)] 97.2 F (36.2 C) (08/19 1800) Pulse Rate:  [50-90] 90  (08/19 1800) Cardiac Rhythm:  [-] Atrial paced (08/19 1800) Resp:  [14-18] 14  (08/19 1800) BP: (107-132)/(55-74) 117/62 mmHg (08/19 1800) SpO2:  [95 %-99 %] 97 % (08/19 1800) FiO2 (%):  [50 %] 50 % (08/19 1642)  Hemodynamic parameters for last 24 hours: PAP: (24-34)/(18-27) 31/26 mmHg CO:  [5.8 L/min-7.5 L/min] 6.1 L/min CI:  [2.3 L/min/m2-3 L/min/m2] 2.5 L/min/m2  Intake/Output from previous day:   Intake/Output this shift: Total I/O In: 5321.7 [I.V.:3931.7; Blood:750; IV Piggyback:640] Out: 2905 [Urine:1365; Blood:1400; Chest Tube:140]  A Paced 90  Lab Results:  Basename 09/11/11 1640 09/11/11 1639 09/11/11 1405  WBC 17.1* -- --  HGB 12.5* 12.9* --  HCT 36.1* 38.0* --  PLT 120* -- 148*   BMET:  Basename 09/11/11 1639 09/11/11 1516  NA 140 138  K 3.9 4.0  CL -- --  CO2 -- --  GLUCOSE 145* 176*  BUN -- --  CREATININE -- --  CALCIUM -- --    PT/INR:  Basename 09/11/11 1640  LABPROT 16.7*  INR 1.33   ABG    Component Value Date/Time   PHART 7.376 09/11/2011 1642   HCO3 23.2 09/11/2011 1642   TCO2 24 09/11/2011 1642   ACIDBASEDEF 2.0 09/11/2011 1642   O2SAT 92.0 09/11/2011 1642   CBG (last 3)  No results found for this basename: GLUCAP:3 in the last 72 hours  Assessment/Plan: S/P Procedure(s) (LRB): REDO CORONARY ARTERY BYPASS GRAFTING (CABG) (N/A) TRANSESOPHAGEAL ECHOCARDIOGRAM (TEE) (N/A) Cont postop care   LOS: 0 days    VAN TRIGT III,Shivam Mestas 09/11/2011

## 2011-09-11 NOTE — Transfer of Care (Signed)
Immediate Anesthesia Transfer of Care Note  Patient: Nicholas Lynn  Procedure(s) Performed: Procedure(s) (LRB): REDO CORONARY ARTERY BYPASS GRAFTING (CABG) (N/A) TRANSESOPHAGEAL ECHOCARDIOGRAM (TEE) (N/A)  Patient Location: SICU  Anesthesia Type: General  Level of Consciousness: sedated  Airway & Oxygen Therapy: Patient remains intubated per anesthesia plan  Post-op Assessment: Report given to PACU RN and Post -op Vital signs reviewed and stable  Post vital signs: Reviewed and stable  Complications: No apparent anesthesia complications

## 2011-09-12 ENCOUNTER — Encounter (HOSPITAL_COMMUNITY): Payer: Self-pay | Admitting: Cardiothoracic Surgery

## 2011-09-12 ENCOUNTER — Inpatient Hospital Stay (HOSPITAL_COMMUNITY): Payer: Medicare Other

## 2011-09-12 LAB — CBC
HCT: 34.2 % — ABNORMAL LOW (ref 39.0–52.0)
Hemoglobin: 11.8 g/dL — ABNORMAL LOW (ref 13.0–17.0)
Hemoglobin: 11.7 g/dL — ABNORMAL LOW (ref 13.0–17.0)
MCH: 30.7 pg (ref 26.0–34.0)
MCV: 88.1 fL (ref 78.0–100.0)
MCV: 89.8 fL (ref 78.0–100.0)
Platelets: 136 10*3/uL — ABNORMAL LOW (ref 150–400)
RBC: 3.88 MIL/uL — ABNORMAL LOW (ref 4.22–5.81)
RBC: 3.81 MIL/uL — ABNORMAL LOW (ref 4.22–5.81)
WBC: 17 10*3/uL — ABNORMAL HIGH (ref 4.0–10.5)

## 2011-09-12 LAB — GLUCOSE, CAPILLARY
Glucose-Capillary: 108 mg/dL — ABNORMAL HIGH (ref 70–99)
Glucose-Capillary: 112 mg/dL — ABNORMAL HIGH (ref 70–99)
Glucose-Capillary: 121 mg/dL — ABNORMAL HIGH (ref 70–99)
Glucose-Capillary: 127 mg/dL — ABNORMAL HIGH (ref 70–99)
Glucose-Capillary: 141 mg/dL — ABNORMAL HIGH (ref 70–99)
Glucose-Capillary: 150 mg/dL — ABNORMAL HIGH (ref 70–99)
Glucose-Capillary: 164 mg/dL — ABNORMAL HIGH (ref 70–99)
Glucose-Capillary: 104 mg/dL — ABNORMAL HIGH (ref 70–99)
Glucose-Capillary: 103 mg/dL — ABNORMAL HIGH (ref 70–99)
Glucose-Capillary: 110 mg/dL — ABNORMAL HIGH (ref 70–99)
Glucose-Capillary: 113 mg/dL — ABNORMAL HIGH (ref 70–99)
Glucose-Capillary: 115 mg/dL — ABNORMAL HIGH (ref 70–99)
Glucose-Capillary: 119 mg/dL — ABNORMAL HIGH (ref 70–99)
Glucose-Capillary: 94 mg/dL (ref 70–99)
Glucose-Capillary: 99 mg/dL (ref 70–99)
Glucose-Capillary: 139 mg/dL — ABNORMAL HIGH (ref 70–99)
Glucose-Capillary: 152 mg/dL — ABNORMAL HIGH (ref 70–99)
Glucose-Capillary: 174 mg/dL — ABNORMAL HIGH (ref 70–99)

## 2011-09-12 LAB — POCT I-STAT, CHEM 8
BUN: 13 mg/dL (ref 6–23)
Calcium, Ion: 1.26 mmol/L (ref 1.13–1.30)
Chloride: 100 mEq/L (ref 96–112)
Creatinine, Ser: 0.9 mg/dL (ref 0.50–1.35)
Glucose, Bld: 155 mg/dL — ABNORMAL HIGH (ref 70–99)
HCT: 35 % — ABNORMAL LOW (ref 39.0–52.0)
Hemoglobin: 11.9 g/dL — ABNORMAL LOW (ref 13.0–17.0)
Potassium: 4.9 mEq/L (ref 3.5–5.1)
Sodium: 135 mEq/L (ref 135–145)
TCO2: 23 mmol/L (ref 0–100)

## 2011-09-12 LAB — MAGNESIUM: Magnesium: 2.3 mg/dL (ref 1.5–2.5)

## 2011-09-12 LAB — BASIC METABOLIC PANEL
BUN: 10 mg/dL (ref 6–23)
CO2: 26 mEq/L (ref 19–32)
Chloride: 106 mEq/L (ref 96–112)
Creatinine, Ser: 0.89 mg/dL (ref 0.50–1.35)

## 2011-09-12 LAB — CREATININE, SERUM: GFR calc Af Amer: 80 mL/min — ABNORMAL LOW (ref >90)

## 2011-09-12 MED ORDER — AMIODARONE HCL IN DEXTROSE 360-4.14 MG/200ML-% IV SOLN: 1.0000 mg/min | INTRAVENOUS | Status: AC

## 2011-09-12 MED ORDER — AMIODARONE IV BOLUS ONLY 150 MG/100ML: 150.0000 mg | Freq: Once | INTRAVENOUS | Status: AC

## 2011-09-12 MED ORDER — AMIODARONE HCL IN DEXTROSE 360-4.14 MG/200ML-% IV SOLN
INTRAVENOUS | Status: AC
  Administered 2011-09-12: 200 mL
  Filled 2011-09-12: qty 200

## 2011-09-12 MED ORDER — INSULIN ASPART 100 UNIT/ML ~~LOC~~ SOLN: 0.0000 [IU] | SUBCUTANEOUS | Status: DC

## 2011-09-12 MED ORDER — AMIODARONE LOAD VIA INFUSION
150.0000 mg | Freq: Once | INTRAVENOUS | Status: AC
  Administered 2011-09-12: 150 mg via INTRAVENOUS
  Filled 2011-09-12: qty 83.34

## 2011-09-12 MED ORDER — INSULIN ASPART 100 UNIT/ML ~~LOC~~ SOLN
0.0000 [IU] | SUBCUTANEOUS | Status: DC
  Administered 2011-09-12: 4 [IU] via SUBCUTANEOUS
  Administered 2011-09-13: via SUBCUTANEOUS
  Administered 2011-09-13 – 2011-09-15 (×13): 2 [IU] via SUBCUTANEOUS

## 2011-09-12 MED ORDER — FUROSEMIDE 10 MG/ML IJ SOLN
40.0000 mg | Freq: Once | INTRAMUSCULAR | Status: AC
  Administered 2011-09-12: 40 mg via INTRAVENOUS

## 2011-09-12 MED ORDER — AMIODARONE HCL IN DEXTROSE 360-4.14 MG/200ML-% IV SOLN
0.5000 mg/min | INTRAVENOUS | Status: DC
  Filled 2011-09-12 (×3): qty 200

## 2011-09-12 MED ORDER — INSULIN ASPART 100 UNIT/ML ~~LOC~~ SOLN
0.0000 [IU] | SUBCUTANEOUS | Status: DC
  Administered 2011-09-12: 2 [IU] via SUBCUTANEOUS

## 2011-09-12 MED ORDER — PANTOPRAZOLE SODIUM 40 MG PO TBEC
40.0000 mg | DELAYED_RELEASE_TABLET | Freq: Every day | ORAL | Status: DC
  Administered 2011-09-12 – 2011-09-15 (×4): 40 mg via ORAL
  Filled 2011-09-12 (×4): qty 1

## 2011-09-12 MED ORDER — ENOXAPARIN SODIUM 40 MG/0.4ML ~~LOC~~ SOLN
40.0000 mg | SUBCUTANEOUS | Status: DC
  Administered 2011-09-12 – 2011-09-13 (×2): 40 mg via SUBCUTANEOUS
  Filled 2011-09-12 (×4): qty 0.4

## 2011-09-12 MED ORDER — AMIODARONE IV BOLUS ONLY 150 MG/100ML
150.0000 mg | Freq: Once | INTRAVENOUS | Status: AC
  Administered 2011-09-13: 150 mg via INTRAVENOUS

## 2011-09-12 MED FILL — Potassium Chloride Inj 2 mEq/ML: INTRAVENOUS | Qty: 40 | Status: AC

## 2011-09-12 MED FILL — Magnesium Sulfate Inj 50%: INTRAMUSCULAR | Qty: 10 | Status: AC

## 2011-09-12 NOTE — Progress Notes (Signed)
  Amiodarone Drug - Drug Interaction Consult Note  Recommendations: Amiodarone is metabolized by the cytochrome P450 system and therefore has the potential to cause many drug interactions. Amiodarone has an average plasma half-life of 50 days (range 20 to 100 days).   There is potential for drug interactions to occur several weeks or months after stopping treatment and the onset of drug interactions may be slow after initiating amiodarone.   [x]  Statins: Increased risk of myopathy. Simvastatin- restrict dose to 20mg  daily. Other statins: counsel patients to report any muscle pain or weakness immediately.  []  Anticoagulants: Amiodarone can increase anticoagulant effect. Consider warfarin dose reduction. Patients should be monitored closely and the dose of anticoagulant altered accordingly, remembering that amiodarone levels take several weeks to stabilize.  []  Antiepileptics: Amiodarone can increase plasma concentration of phenytoin, phenytoin dose should be reduced. Note that small changes in phenytoin dose can result in large changes in phenytoin levels. Monitor patient closely and counsel on signs of toxicity.  [x]  Beta blockers: increased risk of bradycardia, AV block and myocardial depression. Sotalol - avoid concomitant use.  []   Calcium channel blockers (diltiazem and verapamil): increased risk of bradycardia, AV block and myocardial depression.  []   Cyclosporine: Amiodarone increases levels of cyclosporine. Reduced dose of cyclosporine is recommended.  []  Digoxin dose should be halved when amiodarone is started.  [x]  Diuretics: increased risk of cardiotoxicity if hypokalemia occurs.  []  Oral hypoglycemic agents (glyburide, glipizide, glimepiride): increased risk of hypoglycemia. Patient's glucose levels should be monitored closely when initiating amiodarone therapy.   []  Drugs that prolong the QT interval: Concurrent therapy is contraindicated due to the increased risk of torsades de  pointes; . Antibiotics: e.g. fluoroquinolones, erythromycin. . Antiarrhythmics: e.g. quinidine, procainamide, disopyramide, sotalol. . Antipsychotics: e.g. phenothiazines, haloperidol.  . Lithium, tricyclic antidepressants, and methadone. Thank You,  Emeline Gins  09/12/2011 10:28 PM

## 2011-09-12 NOTE — Anesthesia Postprocedure Evaluation (Signed)
  Anesthesia Post-op Note  Patient: Nicholas Lynn  Procedure(s) Performed: Procedure(s) (LRB): REDO CORONARY ARTERY BYPASS GRAFTING (CABG) (N/A) TRANSESOPHAGEAL ECHOCARDIOGRAM (TEE) (N/A)  Patient Location: SICU  Anesthesia Type: General  Level of Consciousness: awake, alert  and oriented  Airway and Oxygen Therapy: Patient Spontanous Breathing  Post-op Pain: none  Post-op Assessment: Post-op Vital signs reviewed, Patient's Cardiovascular Status Stable, Respiratory Function Stable, Patent Airway and No signs of Nausea or vomiting  Post-op Vital Signs: Reviewed and stable  Complications: No apparent anesthesia complications

## 2011-09-12 NOTE — Progress Notes (Signed)
Patient had a short burst of a-fib with a rate up to 160. Converted back to NSR with frequent PACs, with a rate in the 70s after a few seconds. Asymptomatic; patient asleep. BP 130/50 after event. Thresa Ross RN

## 2011-09-12 NOTE — Progress Notes (Signed)
TCTS BRIEF SICU PROGRESS NOTE  1 Day Post-Op  S/P Procedure(s) (LRB): REDO CORONARY ARTERY BYPASS GRAFTING (CABG) (N/A) TRANSESOPHAGEAL ECHOCARDIOGRAM (TEE) (N/A)   Stable day NSR BP stable O2 sats 95% UOP adequate  Plan: Continue routine care  OWEN,CLARENCE H 09/12/2011 6:34 PM

## 2011-09-12 NOTE — Progress Notes (Signed)
Patient converted to afib with RVR at 2220 with a rate 140-180. Patient says he can feel his heart beating "a little faster" in his chest. Denies any new pain. Denies shortness of breath. EKG done to confirm. Dr. Cornelius Moras notified of change. Order for Amiodarone per protocol with 150mg  IV bolus received. Drip/bolus started at 2230. BP at start of drip 127/49. HR 157. Thresa Ross RN

## 2011-09-12 NOTE — Op Note (Signed)
Nicholas Lynn, Nicholas Lynn NO.:  [id number]  MEDICAL RECORD NO.:  [id number]  LOCATION:  2306                         FACILITY:  [facility]  PHYSICIAN:  Sheliah Plane, MD    DATE OF BIRTH:  [birth year]  DATE OF PROCEDURE:  09/11/2011 DATE OF DISCHARGE:                              OPERATIVE REPORT   PREOPERATIVE DIAGNOSIS:  Recurrent coronary artery occlusive disease.  POSTOPERATIVE DIAGNOSIS:  Recurrent coronary artery occlusive disease.  SURGICAL PROCEDURE:  Redo coronary artery bypass grafting, with use of the right internal mammary artery as a free graft to the posterior descending coronary artery, reverse saphenous vein graft to the 2nd obtuse marginal, reverse saphenous vein graft to the ramus intermedius with bilateral thigh vein endo harvest.  SURGEON:  Sheliah Plane, MD  FIRST ASSISTANT:  Rowe Clack, PA-C  BRIEF HISTORY:  The patient is a 66 year old male who 17 years previously had undergone coronary artery bypass graft.  He had no cardiac difficulties until approximately 3 weeks prior to surgery when he began having anginal symptoms.  He underwent repeat cardiac catheterization by Dr. Herbie Baltimore which demonstrated occlusion of the right graft and diffused disease throughout the sequential vein graft of the 2nd obtuse marginal and distal circumflex.  The vein graft appeared very diseased and the overall ejection fraction was approximately 40%.  The risks and options of surgery were discussed with him in detail and he was agreeable to proceeding with redo coronary artery bypass graft.  DESCRIPTION OF PROCEDURE:  The patient underwent general endotracheal anesthesia without incident.  Time-out procedure was performed.  The Swan-Ganz and arterial line monitors were placed.  The chest, abdomen, and legs were prepped with Betadine, draped in the usual sterile manner. Consideration of using the nondominant arm, the left radial artery was not used because the  radial artery was the primary supplier of the palmar arch.  Median sternotomy was performed with a sagittal saw, underlying mediastinum was dissected free.  After tedious dissection, the ascending aorta and right atrium were exposed to allow cannulation. The previous vein had been harvested from the right lower leg.  Endo vein harvest was started in the left thigh and after 1 segment of vein around the knee, the vein bifurcated and became too small to use.  An additional segment of vein was then harvested endoscopically from the right thigh and was of good quality and caliber.  The right internal mammary artery was dissected down as a pedicle graft initially and had excellent free flow.  Length wise it was decided it was suitable for a free graft and was divided, ligated proximally and prepared to be used as the patient was systemically heparinized.  The ascending aorta was cannulated.  The right atrium was cannulated.  Retrograde cardioplegic catheter was introduced into the right atrium and coronary sinus position confirmed by a anterograde cardioplegic catheter was placed into the atrium.  The patient was placed on cardiopulmonary bypass 2.4 L/minute per m squared.  The remainder of the dissection around the left ventricle was placed.  The aortic crossclamp was applied, and 900 mL of cold blood cardioplegia was administered both in the aortic root and then retrograde.  A  bulldog catheter was placed on the previously placed mammary artery, left internal mammary artery to the LAD which had been dissected free and preserved.  Attention was turned 1st to the posterior descending coronary artery which was opened using running 8-0 Prolene. The three right internal mammary artery was used for distal anastomosis. Additional cold blood cardioplegia was administered down the graft.  The heart was then elevated and the 2nd obtuse marginal which had been bypassed previously was opened, was a  thin-walled vessel, but did admit a 1 mm probe distally and 1.5 mm probe proximally.  The very distal circumflex was very small and was not bypassed, though it still supplied with a short segment of sequential old graft which was left in place. The attention was then turned to the ramus branch which was not bypassed previously.  This vessel was opened, admitted a 1.5 mm probe using a separate segment of reverse saphenous vein graft.  The distal anastomosis was performed with running 7-0 with crossclamp still in place and 1 new punch aortotomy was created and the hoods 2 previously placed grafts were used for proximal anastomosis.  The three right internal mammary artery was proximally anastomosed to the hood of the old vein graft to the posterior descending.  The vein graft to the ramus was anastomosed to the most proximal hood of the vein graft to OM2 and distal circ, and the new vein graft to the 2nd obtuse marginal was anastomosed to the new punch aortotomy and a new graft marker was applied.  Prior to completion of the anastomoses, the bulldog clamp artery on the left internal mammary artery was removed with prompt rise itself the temperature.  The heart was allowed to passively fill and de- aired.  Aortic cross-clamp was removed with total crossclamp time of 114 minutes.  The patient promptly returned to a sinus rhythm.  He was rewarmed to 37 degrees, he was started on milrinone and dopamine infusions.  He was then weaned from cardiopulmonary bypass without difficulty.  He remained hemodynamically stable.  He was decannulated in usual fashion.  Protamine sulfate was administered.  The TEE probe, which have been placed showed adequate LV function.  With the operative field hemostatic, a right chest tube was left in place.  Two Blake mediastinal drain were left in place.  The mediastinal fat was closed over the ascending aorta.  The sternum was closed with #6 stainless steel wire.   Fascia closed with interrupted 0 Vicryl, running 3-0 Vicryl, subcutaneous tissue, 4-0 subcuticular stitch and skin edges. Dry dressings were applied.  Sponge and needle count was reported as correct at the completion of procedure.  The patient did not require any blood bank blood products during the operative procedure.  Total pump time was 149 minutes.  Sponge and needle count was reported as correct with exception of two 8-0 needles which were not evident on x-ray.     Sheliah Plane, MD     EG/MEDQ  D:  09/12/2011  T:  09/12/2011  Job:  [id number]  cc:   Landry Corporal, MD

## 2011-09-12 NOTE — Progress Notes (Signed)
Patient ID: Nicholas Lynn, male   DOB: 1946-01-03, 66 y.o.   MRN: [id number] TCTS DAILY PROGRESS NOTE                   [address]            [place] [address]          [phone]      1 Day Post-Op Procedure(s) (LRB): REDO CORONARY ARTERY BYPASS GRAFTING (CABG) (N/A) TRANSESOPHAGEAL ECHOCARDIOGRAM (TEE) (N/A)  Total Length of Stay:  LOS: 1 day   Subjective: Extubated, neuro intact  Objective: Vital signs in last 24 hours: Temp:  [96.8 F (36 C)-100 F (37.8 C)] 99.3 F (37.4 C) (08/20 0715) Pulse Rate:  [60-93] 76  (08/20 0715) Cardiac Rhythm:  [-] Normal sinus rhythm (08/20 0600) Resp:  [0-21] 11  (08/20 0715) BP: (92-124)/(43-69) 104/49 mmHg (08/20 0715) SpO2:  [90 %-100 %] 95 % (08/20 0715) FiO2 (%):  [40 %-50 %] 40 % (08/19 2000) Weight:  [282 lb 6.6 oz (128.1 kg)] 282 lb 6.6 oz (128.1 kg) (08/20 0350)  Filed Weights   09/10/11 1300 09/12/11 0350  Weight: 272 lb 4.8 oz (123.514 kg) 282 lb 6.6 oz (128.1 kg)    Weight change: 10 lb 1.8 oz (4.586 kg)   Hemodynamic parameters for last 24 hours: PAP: (21-45)/(11-27) 28/16 mmHg CO:  [5.3 L/min-8.5 L/min] 7.6 L/min CI:  [2.2 L/min/m2-3.4 L/min/m2] 3.1 L/min/m2  Intake/Output from previous day: 08/19 0701 - 08/20 0700 In: 7075 [P.O.:240; I.V.:5053; Blood:750; IV Piggyback:1032] Out: 3790 [Urine:1970; Blood:1400; Chest Tube:420]  Intake/Output this shift:    Current Meds: Scheduled Meds:   . acetaminophen (TYLENOL) oral liquid 160 mg/5 mL  650 mg Per Tube NOW   Or  . acetaminophen  650 mg Rectal NOW  . acetaminophen  1,000 mg Oral Q6H   Or  . acetaminophen (TYLENOL) oral liquid 160 mg/5 mL  975 mg Per Tube Q6H  . aspirin EC  325 mg Oral Daily   Or  . aspirin  324 mg Per Tube Daily  . bisacodyl  10 mg Oral Daily   Or  . bisacodyl  10 mg Rectal Daily  . cefUROXime (ZINACEF)  IV  1.5 g Intravenous To OR  . cefUROXime (ZINACEF)  IV  1.5 g Intravenous Q12H  . dexmedetomidine  0.1-0.7  mcg/kg/hr Intravenous To OR  . docusate sodium  200 mg Oral Daily  . DOPamine  2-20 mcg/kg/min Intravenous To OR  . ezetimibe-simvastatin  1 tablet Oral QHS  . famotidine (PEPCID) IV  20 mg Intravenous Q12H  . insulin (NOVOLIN-R) infusion   Intravenous To OR  . insulin regular  0-10 Units Intravenous TID WC  . magnesium sulfate  4 g Intravenous Once  . metoprolol tartrate  12.5 mg Oral BID   Or  . metoprolol tartrate  12.5 mg Per Tube BID  . nitroGLYCERIN  2-200 mcg/min Intravenous To OR  . nitroglycerin-nicardipine-HEPARIN-sodium bicarbonate irrigation for artery spasm   Irrigation To OR  . nitroglycerin-nicardipine-HEPARIN-sodium bicarbonate irrigation for artery spasm   Irrigation To OR  . pantoprazole  40 mg Oral Q1200  . phenylephrine (NEO-SYNEPHRINE) Adult infusion  30-200 mcg/min Intravenous To OR  . potassium chloride  10 mEq Intravenous Q1 Hr x 3  . sodium chloride  3 mL Intravenous Q12H  . tranexamic acid  15 mg/kg Intravenous To OR  . tranexamic acid (CYKLOKAPRON) infusion ([facility])  1.5 mg/kg/hr Intravenous To OR  . vancomycin  1,500 mg Intravenous To OR  . vancomycin  1,000 mg Intravenous Once                                                               Continuous Infusions:   . sodium chloride    . sodium chloride 20 mL (09/11/11 1630)  . sodium chloride    . dexmedetomidine Stopped (09/11/11 1900)  . DOPamine Stopped (09/12/11 0100)  . insulin (NOVOLIN-R) infusion 3.1 Units/hr (09/12/11 0700)  . lactated ringers 30 mL (09/11/11 1630)  . milrinone 0.3 mcg/kg/min (09/12/11 0441)  . nitroGLYCERIN 50 mcg/min (09/12/11 0700)  . phenylephrine (NEO-SYNEPHRINE) Adult infusion Stopped (09/11/11 1630)   PRN Meds:.albumin human, lactated ringers, metoprolol, midazolam, morphine injection, morphine injection, ondansetron (ZOFRAN) IV, oxyCODONE, sodium chloride, DISCONTD: hemostatic agents, DISCONTD: sodium chloride irrigation, DISCONTD: Surgifoam 1 Gm with 0.9% sodium  chloride (4 ml) topical solution  General appearance: alert and cooperative Neurologic: intact Heart: regular rate and rhythm, S1, S2 normal, no murmur, click, rub or gallop and normal apical impulse Lungs: diminished breath sounds bibasilar Abdomen: soft, non-tender; bowel sounds normal; no masses,  no organomegaly Extremities: extremities normal, atraumatic, no cyanosis or edema and Homans sign is negative, no sign of DVT Wound: sternum stable  Lab Results: CBC: Basename 09/12/11 0429 09/11/11 2242 09/11/11 2230  WBC 17.0* -- 15.1*  HGB 11.8* 12.2* --  HCT 34.2* 36.0* --  PLT 136* -- 109*   BMET:  Basename 09/12/11 0429 09/11/11 2242  NA 138 139  K 4.8 4.3  CL 106 105  CO2 26 --  GLUCOSE 107* 161*  BUN 10 9  CREATININE 0.89 0.70  CALCIUM 8.5 --    PT/INR:  Basename 09/11/11 1640  LABPROT 16.7*  INR 1.33   Radiology: Dg Chest Portable 1 View  09/11/2011  *RADIOLOGY REPORT*  Clinical Data: Postop CABG, two needles missing from count  PORTABLE CHEST - 1 VIEW  Comparison: Chest x-ray of 09/08/2011  Findings: The tip of the endotracheal tube is approximately 4.8 cm above the carina.  The Swan-Ganz catheter is present with the tip in the right main pulmonary artery A right chest tube is noted with no pneumothorax.  The lungs are not well aerated with perhaps mild pulmonary vascular congestion present.  Cardiomegaly is stable.  No definite retained needle is seen.  Two thin filament- like wires overlie the right lower medial chest of questionable significance.  IMPRESSION:  1.  Poor aeration with cardiomegaly and probable mild pulmonary vascular congestion. 2.  Tip of endotracheal tube is 4.8 cm above the carina. 3.  Right chest tube present.  No pneumothorax. 4.  Two thin metallic wires overlie the lower medial right chest of questionable significance.   Original Report Authenticated By: Juline Patch, M.D.      Assessment/Plan: S/P Procedure(s) (LRB): REDO CORONARY ARTERY BYPASS  GRAFTING (CABG) (N/A) TRANSESOPHAGEAL ECHOCARDIOGRAM (TEE) (N/A) Mobilize Diuresis d/c tubes/lines Continue foley due to diuresing patient, strict I&O, patient in ICU and urinary output monitoring See progression orders Expected Acute  Blood - loss Anemia    Delight Ovens MD  Beeper [phone] Office [phone] 09/12/2011 7:34 AM

## 2011-09-13 ENCOUNTER — Inpatient Hospital Stay (HOSPITAL_COMMUNITY): Payer: Medicare Other

## 2011-09-13 ENCOUNTER — Encounter (HOSPITAL_COMMUNITY): Payer: Self-pay | Admitting: Certified Registered Nurse Anesthetist

## 2011-09-13 DIAGNOSIS — I255 Ischemic cardiomyopathy: Secondary | ICD-10-CM | POA: Diagnosis present

## 2011-09-13 DIAGNOSIS — I480 Paroxysmal atrial fibrillation: Secondary | ICD-10-CM | POA: Diagnosis not present

## 2011-09-13 LAB — TYPE AND SCREEN
ABO/RH(D): O NEG
Antibody Screen: NEGATIVE
Unit division: 0
Unit division: 0
Unit division: 0
Unit division: 0
Unit division: 0
Unit division: 0
Unit division: 0
Unit division: 0

## 2011-09-13 LAB — GLUCOSE, CAPILLARY
Glucose-Capillary: 127 mg/dL — ABNORMAL HIGH (ref 70–99)
Glucose-Capillary: 132 mg/dL — ABNORMAL HIGH (ref 70–99)
Glucose-Capillary: 140 mg/dL — ABNORMAL HIGH (ref 70–99)
Glucose-Capillary: 147 mg/dL — ABNORMAL HIGH (ref 70–99)
Glucose-Capillary: 153 mg/dL — ABNORMAL HIGH (ref 70–99)
Glucose-Capillary: 154 mg/dL — ABNORMAL HIGH (ref 70–99)

## 2011-09-13 LAB — BASIC METABOLIC PANEL
BUN: 12 mg/dL (ref 6–23)
CO2: 29 mEq/L (ref 19–32)
Calcium: 8.8 mg/dL (ref 8.4–10.5)
Chloride: 98 mEq/L (ref 96–112)
Creatinine, Ser: 0.87 mg/dL (ref 0.50–1.35)
GFR calc Af Amer: >90 mL/min (ref >90)
GFR calc non Af Amer: 87 mL/min — ABNORMAL LOW (ref >90)
Glucose, Bld: 153 mg/dL — ABNORMAL HIGH (ref 70–99)
Potassium: 4.6 mEq/L (ref 3.5–5.1)
Sodium: 131 mEq/L — ABNORMAL LOW (ref 135–145)

## 2011-09-13 LAB — CBC
HCT: 33.3 % — ABNORMAL LOW (ref 39.0–52.0)
Hemoglobin: 11 g/dL — ABNORMAL LOW (ref 13.0–17.0)
MCH: 29.6 pg (ref 26.0–34.0)
MCHC: 33 g/dL (ref 30.0–36.0)
MCV: 89.5 fL (ref 78.0–100.0)
Platelets: 106 10*3/uL — ABNORMAL LOW (ref 150–400)
RBC: 3.72 MIL/uL — ABNORMAL LOW (ref 4.22–5.81)
RDW: 13.8 % (ref 11.5–15.5)
WBC: 19.4 10*3/uL — ABNORMAL HIGH (ref 4.0–10.5)

## 2011-09-13 MED ORDER — AMIODARONE HCL IN DEXTROSE 360-4.14 MG/200ML-% IV SOLN
30.0000 mg/h | INTRAVENOUS | Status: DC
  Administered 2011-09-13: 60 mg/h via INTRAVENOUS
  Administered 2011-09-14: 30 mg/h via INTRAVENOUS
  Filled 2011-09-13 (×9): qty 200

## 2011-09-13 MED ORDER — DILTIAZEM HCL 100 MG IV SOLR
5.0000 mg/h | INTRAVENOUS | Status: DC
  Administered 2011-09-13: 10 mg/h via INTRAVENOUS
  Administered 2011-09-13 (×2): 15 mg/h via INTRAVENOUS
  Administered 2011-09-13: 5 mg/h via INTRAVENOUS
  Administered 2011-09-14: 15 mg/h via INTRAVENOUS
  Filled 2011-09-13 (×3): qty 100

## 2011-09-13 MED ORDER — FUROSEMIDE 10 MG/ML IJ SOLN
40.0000 mg | Freq: Once | INTRAMUSCULAR | Status: AC
  Administered 2011-09-13: 40 mg via INTRAVENOUS

## 2011-09-13 MED ORDER — DILTIAZEM LOAD VIA INFUSION
10.0000 mg | Freq: Once | INTRAVENOUS | Status: AC
  Administered 2011-09-13: 10 mg via INTRAVENOUS
  Filled 2011-09-13: qty 10

## 2011-09-13 MED FILL — Sodium Chloride IV Soln 0.9%: INTRAVENOUS | Qty: 1000 | Status: AC

## 2011-09-13 MED FILL — Electrolyte-R (PH 7.4) Solution: INTRAVENOUS | Qty: 5000 | Status: AC

## 2011-09-13 MED FILL — Sodium Chloride Irrigation Soln 0.9%: Qty: 3000 | Status: AC

## 2011-09-13 MED FILL — Mannitol IV Soln 20%: INTRAVENOUS | Qty: 500 | Status: AC

## 2011-09-13 MED FILL — Sodium Bicarbonate IV Soln 8.4%: INTRAVENOUS | Qty: 50 | Status: AC

## 2011-09-13 MED FILL — Lidocaine HCl IV Inj 20 MG/ML: INTRAVENOUS | Qty: 5 | Status: AC

## 2011-09-13 MED FILL — Heparin Sodium (Porcine) Inj 1000 Unit/ML: INTRAMUSCULAR | Qty: 30 | Status: AC

## 2011-09-13 NOTE — Progress Notes (Signed)
Pt taken off CPAP by RN due to pt's SpO2 in low to mid 80s with using auto CPAP with 4-6L bled in. Pt placed on 4L N/C, SpO2 currently 98% at this time. No distress noted.

## 2011-09-13 NOTE — Care Management Note (Signed)
    Page 1 of 2   09/19/2011     2:18:58 PM   CARE MANAGEMENT NOTE 09/19/2011  Patient:  Nicholas Lynn, Nicholas Lynn   Account Number:  [id number]  Date Initiated:  09/13/2011  Documentation initiated by:  AMERSON,JULIE  Subjective/Objective Assessment:   PT S/P REDO CABG ON 09/11/11.  PTA, PT INDEPENDENT, LIVES WITH SPOUSE.     Action/Plan:   MET WITH PT AND WIFE TO DISCUSS DC PLANS.  WIFE TO PROVIDE CARE AT DISCHARGE.  WILL FOLLOW FOR HOME NEEDS AS PT PROGRESSES.   Anticipated DC Date:  09/19/2011   Anticipated DC Plan:  HOME W HOME HEALTH SERVICES      DC Planning Services  CM consult      Choice offered to / List presented to:     DME arranged  Levan Hurst      DME agency  [facility]        Status of service:  In process, will continue to follow Medicare Important Message given?   (If response is "NO", the following Medicare IM given date fields will be blank) Date Medicare IM given:   Date Additional Medicare IM given:    Discharge Disposition:    Per UR Regulation:  Reviewed for med. necessity/level of care/duration of stay  If discussed at Long Length of Stay Meetings, dates discussed:   09/19/2011    Comments:  09-19-11 2:10pm [name], RNBSN [phone] Talked with patient and wife in room.  Live here in [place] - use [facility] on [address] in [place]. Have no problem getting to physician appointments- Feel need RW at home.  Order placed and referral made toJustin.   09/18/11 JULIE AMERSON,RN,BSN PT WITH POST OP AFIB, NOW RATE-CONTROLLED.  INR REMAINS SUBTHERAPEUTIC.  PLAN TO RESTART LASIX FOR MILD LE EDEMA. LIKELY DC IN 1-2 DAYS.

## 2011-09-13 NOTE — Progress Notes (Signed)
Anesthesiology Follow-up:  As noted, [name] developed rapid atrial fibrillation last night now loaded with amiodarone. Otherwise he feels OK taking PO well.  VS: T- 37.1 HR- 105 RR- 20 BP- 107/50  K-4.6 glucose 156 Cr. 0.87 H/H: 11.0/33 Plts: 106,000  Extubated 4 hours post-op.  66 y/o male 2 days S/P redo CABG. Now with post-op Afib. Otherwise doing well.  Kipp Brood, MD

## 2011-09-13 NOTE — Progress Notes (Signed)
Amiodarone not effective in controlling patient's HR. HR 120-150. Per order from Dr. Cornelius Moras, switched patient to Cardizem per protocol. Thresa Ross RN

## 2011-09-13 NOTE — Progress Notes (Signed)
PT has refused CPAP for the night. PT states that he feels the 3 lpm nasal cannula works better for him. PT has o2 sats 100%, no distress. RT will continue to monitor.

## 2011-09-13 NOTE — Addendum Note (Signed)
Addendum  created 09/13/11 1478 by Kipp Brood, MD   Modules edited:Notes Section

## 2011-09-13 NOTE — Consult Note (Signed)
Pt. Seen and examined. Agree with the NP/PA-C note as written. 66 yo male with re-do CABG, now with a-fib and RVR. Overdrive pacing was unsuccesfully attempted. He has received 2 amiodarone boluses. Currently he is on a diltiazem gtts at 15 mg/hr. HR is hovering between 90-110. At this point, I would re-institute amiodarone therapy IV and continue cardizem.    Will follow along with you. Thanks for consulting [place].  Chrystie Nose, MD, [facility] Attending Cardiologist [facility]

## 2011-09-13 NOTE — Progress Notes (Signed)
POD # 2 Redo CABG  Up in chair  BP 106/55  Pulse 59  Temp 97.9 F (36.6 C) (Oral)  Resp 18  Ht 6' 2.02" (1.88 m)  Wt 282 lb 6.6 oz (128.1 kg)  BMI 36.24 kg/m2  SpO2 91%   Intake/Output Summary (Last 24 hours) at 09/13/11 1841 Last data filed at 09/13/11 1800  Gross per 24 hour  Intake 2072.13 ml  Output   2360 ml  Net -287.87 ml    Remains in rate controlled a fib on amio and diltiazem

## 2011-09-13 NOTE — Consult Note (Signed)
Reason for Consult: Atrial fibrilation  Requesting Physician: Dr Tyrone Sage  HPI: This is a 66 y.o. male with a past medical history significant for CAD, s/p CABG '96 with redo CABG X 3 Aug 19th 2013, see Dr Elissa Hefty recent cath/ discharge summary. He is seen now in SICU with rapid AF 100-120 and low B/P 94 systolic, on a Diltiazem drip. Apparently Amiodarone bolus X 2 was tried last night but was not effective according to the RN and Diltiazem started. He has a SSS tendency and recently his beta blocker was decreased secondary to bradycardia after some surgery this spring. He is tolerating his AF well at the moment.  PMHx:  Past Medical History  Diagnosis Date  . Hyperlipidemia   . Heart disease   . Hypertension   . FH: CABG (coronary artery bypass surgery)   . Hernia     umbilical  . GERD (gastroesophageal reflux disease)   . Wears glasses   . Allergy   . Hearing loss   . Arthritis   . Coronary artery disease   . Dysrhythmia     OCCASIONAL PALPITATION  . Heart attack 1988  . Shortness of breath 09/01/2011    recent  . Sleep apnea     USES C-PAP  . Headache     when takes Imdur   Past Surgical History  Procedure Date  . Angioplasty   . Arterial bypass surgry   . Cardiac catheterization   . Cataract extraction   . Umbilical hernia repair 04/04/2011    Procedure: HERNIA REPAIR UMBILICAL ADULT;  Surgeon: Adolph Pollack, MD;  Location: [facility] ORS;  Service: General;  Laterality: N/A;  Umbilical Hernia Repair  . Tee without cardioversion 09/11/2011    Procedure: TRANSESOPHAGEAL ECHOCARDIOGRAM (TEE);  Surgeon: Delight Ovens, MD;  Location: [facility] OR;  Service: Open Heart Surgery;  Laterality: N/A;  . Coronary artery bypass graft 1996    3 VESSELS  . Coronary artery bypass graft 09/11/2011    Procedure: REDO CORONARY ARTERY BYPASS GRAFTING (CABG);  Surgeon: Delight Ovens, MD;  Location: [facility] OR;  Service: Open Heart Surgery;  Laterality: N/A;  Redo Coronary Artery Bypass Graft  times three utilizing the right internal mammary artery and the left  and right greater saphenous veins.    FAMHx: Family History  Problem Relation Age of Onset  . Stroke Mother   . Cancer Father     lung  . Cancer Sister     lung  . Cancer Sister     liver    SOCHx:  reports that he quit smoking about 25 years ago. He has never used smokeless tobacco. He reports that he does not drink alcohol or use illicit drugs.  ALLERGIES: Allergies  Allergen Reactions  . Sulfur Swelling    Causes all joints to swell. Unable to bend them.    ROS: Pertinent items are noted in HPI.  HOME MEDICATIONS: Prescriptions prior to admission  Medication Sig Dispense Refill  . acetaminophen (TYLENOL) 325 MG tablet Take 650 mg by mouth every 4 (four) hours as needed. For pain      . aspirin EC 325 MG tablet Take 325 mg by mouth daily.      . benazepril (LOTENSIN) 20 MG tablet Take 20 mg by mouth at bedtime.       Marland Kitchen Bioflavonoid Products (ESTER C PO) Take 500 mg by mouth daily.        . cetirizine (ZYRTEC) 10 MG tablet Take 10 mg by  mouth daily.        . Cholecalciferol (VITAMIN D-3 PO) Take 1,000 Units by mouth 2 (two) times daily.        . clopidogrel (PLAVIX) 75 MG tablet Take 75 mg by mouth daily.      Marland Kitchen ezetimibe-simvastatin (VYTORIN) 10-20 MG per tablet Take 1 tablet by mouth at bedtime.       . fluticasone (VERAMYST) 27.5 MCG/SPRAY nasal spray Place 2 sprays into the nose daily as needed. For allergies      . isosorbide mononitrate (IMDUR) 30 MG 24 hr tablet Take 15 mg by mouth daily.       . metoprolol (LOPRESSOR) 50 MG tablet Take 50 mg by mouth 2 (two) times daily.       . Misc Natural Products (OSTEO BI-FLEX ADV TRIPLE ST PO) Take 1 tablet by mouth daily.       . montelukast (SINGULAIR) 10 MG tablet Take 10 mg by mouth at bedtime.       . Multiple Vitamin (MULTIVITAMIN WITH MINERALS) TABS Take 1 tablet by mouth daily.      . niacin (NIASPAN) 1000 MG CR tablet Take 1,000 mg by mouth at  bedtime.        . Omega-3 Fatty Acids (FISH OIL PO) Take 1,000 mg by mouth 2 (two) times daily.        Marland Kitchen omeprazole (PRILOSEC) 20 MG capsule Take 20 mg by mouth daily.       . ranolazine (RANEXA) 500 MG 12 hr tablet Take 1,000 mg by mouth 2 (two) times daily.      . nitroGLYCERIN (NITROSTAT) 0.4 MG SL tablet Place 0.4 mg under the tongue every 5 (five) minutes as needed. Chest pain        HOSPITAL MEDICATIONS: I have reviewed the patient's current medications.  VITALS: Blood pressure 103/58, pulse 83, temperature 97.9 F (36.6 C), temperature source Oral, resp. rate 13, height 6' 2.02" (1.88 m), weight 128.1 kg (282 lb 6.6 oz), SpO2 100.00%.  PHYSICAL EXAM: General appearance: alert, cooperative and no distress Lungs: decreased breath sounds Rt 1/2 Heart: irregularly irregular rhythm  LABS: Results for orders placed during the hospital encounter of 09/11/11 (from the past 48 hour(s))  CBC     Status: Abnormal   Collection Time   09/11/11  4:40 PM      Component Value Range Comment   WBC 17.1 (*) 4.0 - 10.5 K/uL    RBC 4.11 (*) 4.22 - 5.81 MIL/uL    Hemoglobin 12.5 (*) 13.0 - 17.0 g/dL    HCT 96.0 (*) 45.4 - 52.0 %    MCV 87.8  78.0 - 100.0 fL    MCH 30.4  26.0 - 34.0 pg    MCHC 34.6  30.0 - 36.0 g/dL    RDW 09.8  11.9 - 14.7 %    Platelets 120 (*) 150 - 400 K/uL   PROTIME-INR     Status: Abnormal   Collection Time   09/11/11  4:40 PM      Component Value Range Comment   Prothrombin Time 16.7 (*) 11.6 - 15.2 seconds    INR 1.33  0.00 - 1.49   APTT     Status: Normal   Collection Time   09/11/11  4:40 PM      Component Value Range Comment   aPTT 31  24 - 37 seconds   POCT I-STAT 3, BLOOD GAS (G3+)     Status: Abnormal   Collection Time  09/11/11  4:42 PM      Component Value Range Comment   pH, Arterial 7.376  7.350 - 7.450    pCO2 arterial 39.1  35.0 - 45.0 mmHg    pO2, Arterial 61.0 (*) 80.0 - 100.0 mmHg    Bicarbonate 23.2  20.0 - 24.0 mEq/L    TCO2 24  0 - 100  mmol/L    O2 Saturation 92.0      Acid-base deficit 2.0  0.0 - 2.0 mmol/L    Patient temperature 36.0 C      Sample type ARTERIAL     GLUCOSE, CAPILLARY     Status: Abnormal   Collection Time   09/11/11  5:29 PM      Component Value Range Comment   Glucose-Capillary 108 (*) 70 - 99 mg/dL   GLUCOSE, CAPILLARY     Status: Abnormal   Collection Time   09/11/11  6:44 PM      Component Value Range Comment   Glucose-Capillary 127 (*) 70 - 99 mg/dL   GLUCOSE, CAPILLARY     Status: Abnormal   Collection Time   09/11/11  7:48 PM      Component Value Range Comment   Glucose-Capillary 112 (*) 70 - 99 mg/dL   POCT I-STAT 3, BLOOD GAS (G3+)     Status: Abnormal   Collection Time   09/11/11  8:15 PM      Component Value Range Comment   pH, Arterial 7.405  7.350 - 7.450    pCO2 arterial 41.7  35.0 - 45.0 mmHg    pO2, Arterial 62.0 (*) 80.0 - 100.0 mmHg    Bicarbonate 26.2 (*) 20.0 - 24.0 mEq/L    TCO2 27  0 - 100 mmol/L    O2 Saturation 92.0      Acid-Base Excess 1.0  0.0 - 2.0 mmol/L    Patient temperature 36.8 C      Sample type ARTERIAL     GLUCOSE, CAPILLARY     Status: Abnormal   Collection Time   09/11/11  9:05 PM      Component Value Range Comment   Glucose-Capillary 121 (*) 70 - 99 mg/dL   POCT I-STAT 3, BLOOD GAS (G3+)     Status: Abnormal   Collection Time   09/11/11  9:15 PM      Component Value Range Comment   pH, Arterial 7.406  7.350 - 7.450    pCO2 arterial 41.6  35.0 - 45.0 mmHg    pO2, Arterial 57.0 (*) 80.0 - 100.0 mmHg    Bicarbonate 26.1 (*) 20.0 - 24.0 mEq/L    TCO2 27  0 - 100 mmol/L    O2 Saturation 89.0      Acid-Base Excess 1.0  0.0 - 2.0 mmol/L    Patient temperature 37.1 C      Sample type ARTERIAL     GLUCOSE, CAPILLARY     Status: Abnormal   Collection Time   09/11/11 10:14 PM      Component Value Range Comment   Glucose-Capillary 141 (*) 70 - 99 mg/dL   CBC     Status: Abnormal   Collection Time   09/11/11 10:30 PM      Component Value Range Comment     WBC 15.1 (*) 4.0 - 10.5 K/uL    RBC 4.18 (*) 4.22 - 5.81 MIL/uL    Hemoglobin 12.5 (*) 13.0 - 17.0 g/dL    HCT 16.1 (*) 09.6 - 52.0 %  MCV 86.1  78.0 - 100.0 fL    MCH 29.9  26.0 - 34.0 pg    MCHC 34.7  30.0 - 36.0 g/dL    RDW 16.1  09.6 - 04.5 %    Platelets 109 (*) 150 - 400 K/uL PLATELET COUNT CONFIRMED BY SMEAR  MAGNESIUM     Status: Normal   Collection Time   09/11/11 10:30 PM      Component Value Range Comment   Magnesium 2.5  1.5 - 2.5 mg/dL   CREATININE, SERUM     Status: Normal   Collection Time   09/11/11 10:30 PM      Component Value Range Comment   Creatinine, Ser 0.74  0.50 - 1.35 mg/dL    GFR calc non Af Amer >90  >90 mL/min    GFR calc Af Amer >90  >90 mL/min   POCT I-STAT, CHEM 8     Status: Abnormal   Collection Time   09/11/11 10:42 PM      Component Value Range Comment   Sodium 139  135 - 145 mEq/L    Potassium 4.3  3.5 - 5.1 mEq/L    Chloride 105  96 - 112 mEq/L    BUN 9  6 - 23 mg/dL    Creatinine, Ser 4.09  0.50 - 1.35 mg/dL    Glucose, Bld 811 (*) 70 - 99 mg/dL    Calcium, Ion 9.14  7.82 - 1.30 mmol/L    TCO2 21  0 - 100 mmol/L    Hemoglobin 12.2 (*) 13.0 - 17.0 g/dL    HCT 95.6 (*) 21.3 - 52.0 %   GLUCOSE, CAPILLARY     Status: Abnormal   Collection Time   09/11/11 11:17 PM      Component Value Range Comment   Glucose-Capillary 164 (*) 70 - 99 mg/dL   GLUCOSE, CAPILLARY     Status: Abnormal   Collection Time   09/12/11 12:09 AM      Component Value Range Comment   Glucose-Capillary 150 (*) 70 - 99 mg/dL   GLUCOSE, CAPILLARY     Status: Abnormal   Collection Time   09/12/11  1:01 AM      Component Value Range Comment   Glucose-Capillary 119 (*) 70 - 99 mg/dL   GLUCOSE, CAPILLARY     Status: Abnormal   Collection Time   09/12/11  2:03 AM      Component Value Range Comment   Glucose-Capillary 113 (*) 70 - 99 mg/dL   GLUCOSE, CAPILLARY     Status: Abnormal   Collection Time   09/12/11  3:06 AM      Component Value Range Comment    Glucose-Capillary 110 (*) 70 - 99 mg/dL   GLUCOSE, CAPILLARY     Status: Abnormal   Collection Time   09/12/11  4:05 AM      Component Value Range Comment   Glucose-Capillary 115 (*) 70 - 99 mg/dL   CBC     Status: Abnormal   Collection Time   09/12/11  4:29 AM      Component Value Range Comment   WBC 17.0 (*) 4.0 - 10.5 K/uL    RBC 3.88 (*) 4.22 - 5.81 MIL/uL    Hemoglobin 11.8 (*) 13.0 - 17.0 g/dL    HCT 08.6 (*) 57.8 - 52.0 %    MCV 88.1  78.0 - 100.0 fL    MCH 30.4  26.0 - 34.0 pg    MCHC 34.5  30.0 - 36.0 g/dL    RDW 16.1  09.6 - 04.5 %    Platelets 136 (*) 150 - 400 K/uL   BASIC METABOLIC PANEL     Status: Abnormal   Collection Time   09/12/11  4:29 AM      Component Value Range Comment   Sodium 138  135 - 145 mEq/L    Potassium 4.8  3.5 - 5.1 mEq/L    Chloride 106  96 - 112 mEq/L    CO2 26  19 - 32 mEq/L    Glucose, Bld 107 (*) 70 - 99 mg/dL    BUN 10  6 - 23 mg/dL    Creatinine, Ser 4.09  0.50 - 1.35 mg/dL    Calcium 8.5  8.4 - 81.1 mg/dL    GFR calc non Af Amer 86 (*) >90 mL/min    GFR calc Af Amer >90  >90 mL/min   MAGNESIUM     Status: Normal   Collection Time   09/12/11  4:29 AM      Component Value Range Comment   Magnesium 2.3  1.5 - 2.5 mg/dL   GLUCOSE, CAPILLARY     Status: Abnormal   Collection Time   09/12/11  5:04 AM      Component Value Range Comment   Glucose-Capillary 103 (*) 70 - 99 mg/dL   GLUCOSE, CAPILLARY     Status: Normal   Collection Time   09/12/11  6:15 AM      Component Value Range Comment   Glucose-Capillary 94  70 - 99 mg/dL   GLUCOSE, CAPILLARY     Status: Normal   Collection Time   09/12/11  7:02 AM      Component Value Range Comment   Glucose-Capillary 99  70 - 99 mg/dL   GLUCOSE, CAPILLARY     Status: Abnormal   Collection Time   09/12/11  8:00 AM      Component Value Range Comment   Glucose-Capillary 104 (*) 70 - 99 mg/dL    Comment 1 Notify RN      Comment 2 Documented in Chart     GLUCOSE, CAPILLARY     Status: Abnormal    Collection Time   09/12/11 11:36 AM      Component Value Range Comment   Glucose-Capillary 139 (*) 70 - 99 mg/dL    Comment 1 Notify RN      Comment 2 Documented in Chart     GLUCOSE, CAPILLARY     Status: Abnormal   Collection Time   09/12/11  4:41 PM      Component Value Range Comment   Glucose-Capillary 152 (*) 70 - 99 mg/dL    Comment 1 Notify RN     POCT I-STAT, CHEM 8     Status: Abnormal   Collection Time   09/12/11  4:44 PM      Component Value Range Comment   Sodium 135  135 - 145 mEq/L    Potassium 4.9  3.5 - 5.1 mEq/L    Chloride 100  96 - 112 mEq/L    BUN 13  6 - 23 mg/dL    Creatinine, Ser 9.14  0.50 - 1.35 mg/dL    Glucose, Bld 782 (*) 70 - 99 mg/dL    Calcium, Ion 9.56  2.13 - 1.30 mmol/L    TCO2 23  0 - 100 mmol/L    Hemoglobin 11.9 (*) 13.0 - 17.0 g/dL    HCT 08.6 (*) 57.8 -  52.0 %   MAGNESIUM     Status: Normal   Collection Time   09/12/11  4:45 PM      Component Value Range Comment   Magnesium 2.2  1.5 - 2.5 mg/dL   CBC     Status: Abnormal   Collection Time   09/12/11  4:45 PM      Component Value Range Comment   WBC 18.3 (*) 4.0 - 10.5 K/uL    RBC 3.81 (*) 4.22 - 5.81 MIL/uL    Hemoglobin 11.7 (*) 13.0 - 17.0 g/dL    HCT 16.1 (*) 09.6 - 52.0 %    MCV 89.8  78.0 - 100.0 fL    MCH 30.7  26.0 - 34.0 pg    MCHC 34.2  30.0 - 36.0 g/dL    RDW 04.5  40.9 - 81.1 %    Platelets 109 (*) 150 - 400 K/uL PLATELET COUNT CONFIRMED BY SMEAR  CREATININE, SERUM     Status: Abnormal   Collection Time   09/12/11  4:45 PM      Component Value Range Comment   Creatinine, Ser 1.07  0.50 - 1.35 mg/dL    GFR calc non Af Amer 69 (*) >90 mL/min    GFR calc Af Amer 80 (*) >90 mL/min   GLUCOSE, CAPILLARY     Status: Abnormal   Collection Time   09/12/11  7:59 PM      Component Value Range Comment   Glucose-Capillary 174 (*) 70 - 99 mg/dL   GLUCOSE, CAPILLARY     Status: Abnormal   Collection Time   09/13/11 12:15 AM      Component Value Range Comment   Glucose-Capillary  153 (*) 70 - 99 mg/dL   GLUCOSE, CAPILLARY     Status: Abnormal   Collection Time   09/13/11  3:51 AM      Component Value Range Comment   Glucose-Capillary 132 (*) 70 - 99 mg/dL   BASIC METABOLIC PANEL     Status: Abnormal   Collection Time   09/13/11  4:20 AM      Component Value Range Comment   Sodium 131 (*) 135 - 145 mEq/L    Potassium 4.6  3.5 - 5.1 mEq/L    Chloride 98  96 - 112 mEq/L    CO2 29  19 - 32 mEq/L    Glucose, Bld 153 (*) 70 - 99 mg/dL    BUN 12  6 - 23 mg/dL    Creatinine, Ser 9.14  0.50 - 1.35 mg/dL    Calcium 8.8  8.4 - 78.2 mg/dL    GFR calc non Af Amer 87 (*) >90 mL/min    GFR calc Af Amer >90  >90 mL/min   CBC     Status: Abnormal   Collection Time   09/13/11  4:20 AM      Component Value Range Comment   WBC 19.4 (*) 4.0 - 10.5 K/uL    RBC 3.72 (*) 4.22 - 5.81 MIL/uL    Hemoglobin 11.0 (*) 13.0 - 17.0 g/dL    HCT 95.6 (*) 21.3 - 52.0 %    MCV 89.5  78.0 - 100.0 fL    MCH 29.6  26.0 - 34.0 pg    MCHC 33.0  30.0 - 36.0 g/dL    RDW 08.6  57.8 - 46.9 %    Platelets 106 (*) 150 - 400 K/uL CONSISTENT WITH PREVIOUS RESULT  GLUCOSE, CAPILLARY     Status: Abnormal  Collection Time   09/13/11  7:39 AM      Component Value Range Comment   Glucose-Capillary 140 (*) 70 - 99 mg/dL   GLUCOSE, CAPILLARY     Status: Abnormal   Collection Time   09/13/11 11:37 AM      Component Value Range Comment   Glucose-Capillary 154 (*) 70 - 99 mg/dL   GLUCOSE, CAPILLARY     Status: Abnormal   Collection Time   09/13/11  4:36 PM      Component Value Range Comment   Glucose-Capillary 127 (*) 70 - 99 mg/dL     IMAGING: Dg Chest Portable 1 View In Am  09/13/2011  *RADIOLOGY REPORT*  Clinical Data: Coronary artery bypass grafting  PORTABLE CHEST - 1 VIEW  Comparison: 09/12/2011  Findings: Swan-Ganz catheter has been removed.  The right IJ catheter sheath is in the projection of the SVC.  Heart size is enlarged.  There are postop changes compatible with CABG. The right- sided  chest tube has been removed.  No pneumothorax is visualized. Lung volumes are low.  Persistent left pleural effusion. Atelectasis in both lung bases appears unchanged.  IMPRESSION:  1.  Removal of right-sided chest tube and Swan-Ganz catheter. 2.  No change in left effusion and bibasilar atelectasis.   Original Report Authenticated By: Rosealee Albee, M.D.    Dg Chest Portable 1 View In Am  09/12/2011  *RADIOLOGY REPORT*  Clinical Data: Post redo coronary artery bypass grafting  PORTABLE CHEST - 1 VIEW  Comparison: Portable exam 0610 hours compared to 09/11/2011  Findings: Interval removal of endotracheal and nasogastric tubes. Right jugular Swan-Ganz catheter with tip projecting over right pulmonary artery at right hilum. Enlargement of cardiac silhouette post CABG. Pulmonary vascular congestion. Bibasilar opacities likely representing atelectasis. Small left pleural effusion. Right thoracostomy tube unchanged. No pneumothorax.  IMPRESSION: Left pleural effusion and probable bibasilar atelectasis.   Original Report Authenticated By: Lollie Marrow, M.D.    Dg Chest Portable 1 View  09/11/2011  *RADIOLOGY REPORT*  Clinical Data: Postop CABG, two needles missing from count  PORTABLE CHEST - 1 VIEW  Comparison: Chest x-ray of 09/08/2011  Findings: The tip of the endotracheal tube is approximately 4.8 cm above the carina.  The Swan-Ganz catheter is present with the tip in the right main pulmonary artery A right chest tube is noted with no pneumothorax.  The lungs are not well aerated with perhaps mild pulmonary vascular congestion present.  Cardiomegaly is stable.  No definite retained needle is seen.  Two thin filament- like wires overlie the right lower medial chest of questionable significance.  IMPRESSION:  1.  Poor aeration with cardiomegaly and probable mild pulmonary vascular congestion. 2.  Tip of endotracheal tube is 4.8 cm above the carina. 3.  Right chest tube present.  No pneumothorax. 4.  Two thin  metallic wires overlie the lower medial right chest of questionable significance.   Original Report Authenticated By: Juline Patch, M.D.     IMPRESSION: Principal Problem:  *CAD (coronary artery disease), CABG '96, re do CABG 09/11/11 X 3 Active Problems:  Atrial fibrillation with RVR post op re do CABG, (Sinus bradycardia pre op)  HTN (hypertension), now hypotensive post op in AF  Sleep apnea, on C-pap   Ischemic cardiomyopathy, "moderate LVD" at cath 09/01/11  Bradycardia, beta blocker decreased 3/13  Dyslipidemia   RECOMMENDATION: MD to see, consider resuming Amiodarone IV in addition to Diltiazem for rate control.  Time Spent Directly with  Patient: 40 minutes  Gamal Todisco K 09/13/2011, 4:39 PM

## 2011-09-13 NOTE — Progress Notes (Signed)
Dr. Cornelius Moras made aware of patient's HR sustaining 120s-150s, despite Amiodarone bolus/infusion, and PRN Lopressor. Order received for additional 150mg  IV bolus of Amiodarone. BP prior to bolus start 111/77 (86) A. Katrinka Herbison RN

## 2011-09-13 NOTE — Progress Notes (Signed)
Patient ID: Nicholas Lynn, male   DOB: 09-Feb-1945, 66 y.o.   MRN: [id number] TCTS DAILY PROGRESS NOTE                   [address]            [place] [address]          [phone]      2 Days Post-Op Procedure(s) (LRB): REDO CORONARY ARTERY BYPASS GRAFTING (CABG) (N/A) TRANSESOPHAGEAL ECHOCARDIOGRAM (TEE) (N/A)  Total Length of Stay:  LOS: 2 days   Subjective: Developed Afib with rapid rate last pm, loaded with Cordarone without response and started on Caredizem still Afib now with rate controlled  Objective: Vital signs in last 24 hours: Temp:  [98.1 F (36.7 C)-99.1 F (37.3 C)] 98.8 F (37.1 C) (08/21 0737) Pulse Rate:  [53-162] 63  (08/21 0800) Cardiac Rhythm:  [-] Atrial fibrillation (08/21 0800) Resp:  [8-22] 12  (08/21 0800) BP: (87-142)/(34-84) 116/57 mmHg (08/21 0800) SpO2:  [82 %-100 %] 96 % (08/21 0800)  Filed Weights   09/10/11 1300 09/12/11 0350  Weight: 272 lb 4.8 oz (123.514 kg) 282 lb 6.6 oz (128.1 kg)    Weight change:    Hemodynamic parameters for last 24 hours: PAP: (30-31)/(17-18) 31/17 mmHg CO:  [8.2 L/min-8.7 L/min] 8.2 L/min CI:  [3.3 L/min/m2-3.5 L/min/m2] 3.3 L/min/m2  Intake/Output from previous day: 08/20 0701 - 08/21 0700 In: 2033.1 [P.O.:645; I.V.:1188.1; IV Piggyback:200] Out: 1545 [Urine:1445; Chest Tube:100]  Intake/Output this shift: Total I/O In: 35 [I.V.:35] Out: 125 [Urine:125]  Current Meds: Scheduled Meds:   . acetaminophen  1,000 mg Oral Q6H   Or  . acetaminophen (TYLENOL) oral liquid 160 mg/5 mL  975 mg Per Tube Q6H  . amiodarone (NEXTERONE PREMIX) 360 mg/200 mL dextrose      . amiodarone  150 mg Intravenous Once  . amiodarone  150 mg Intravenous Once  . amiodarone  150 mg Intravenous Once  . aspirin EC  325 mg Oral Daily   Or  . aspirin  324 mg Per Tube Daily  . bisacodyl  10 mg Oral Daily   Or  . bisacodyl  10 mg Rectal Daily  . cefUROXime (ZINACEF)  IV  1.5 g Intravenous Q12H  .  diltiazem  10 mg Intravenous Once  . docusate sodium  200 mg Oral Daily  . enoxaparin  40 mg Subcutaneous Q24H  . ezetimibe-simvastatin  1 tablet Oral QHS  . insulin aspart  0-24 Units Subcutaneous Q4H  . insulin regular  0-10 Units Intravenous TID WC  . metoprolol tartrate  12.5 mg Oral BID   Or  . metoprolol tartrate  12.5 mg Per Tube BID  . pantoprazole  40 mg Oral Q1200  . sodium chloride  3 mL Intravenous Q12H  . DISCONTD: famotidine (PEPCID) IV  20 mg Intravenous Q12H  . DISCONTD: insulin aspart  0-24 Units Subcutaneous Q4H  . DISCONTD: insulin aspart  0-24 Units Subcutaneous Q4H  . DISCONTD: pantoprazole  40 mg Oral Q1200   Continuous Infusions:   . sodium chloride 5 mL (09/13/11 0800)  . sodium chloride 20 mL (09/13/11 0800)  . sodium chloride    . amiodarone (NEXTERONE PREMIX) 360 mg/200 mL dextrose Stopped (09/13/11 0128)   Followed by  . amiodarone (NEXTERONE PREMIX) 360 mg/200 mL dextrose    . dexmedetomidine Stopped (09/11/11 1900)  . diltiazem (CARDIZEM) infusion 15 mg/hr (09/13/11 0800)  . DOPamine Stopped (09/12/11 0100)  . insulin (  NOVOLIN-R) infusion Stopped (09/12/11 0800)  . lactated ringers 20 mL (09/12/11 1000)  . milrinone Stopped (09/12/11 1610)  . nitroGLYCERIN Stopped (09/12/11 1200)  . phenylephrine (NEO-SYNEPHRINE) Adult infusion Stopped (09/11/11 1630)   PRN Meds:.albumin human, metoprolol, midazolam, morphine injection, ondansetron (ZOFRAN) IV, oxyCODONE, sodium chloride  General appearance: alert and cooperative Neurologic: intact Heart: irregularly irregular rhythm Lungs: diminished breath sounds bibasilar Abdomen: soft, non-tender; bowel sounds normal; no masses,  no organomegaly  Lab Results: CBC: Basename 09/13/11 0420 09/12/11 1645  WBC 19.4* 18.3*  HGB 11.0* 11.7*  HCT 33.3* 34.2*  PLT 106* 109*   BMET:  Basename 09/13/11 0420 09/12/11 1645 09/12/11 1644 09/12/11 0429  NA 131* -- 135 --  K 4.6 -- 4.9 --  CL 98 -- 100 --  CO2  29 -- -- 26  GLUCOSE 153* -- 155* --  BUN 12 -- 13 --  CREATININE 0.87 1.07 -- --  CALCIUM 8.8 -- -- 8.5    PT/INR:  Basename 09/11/11 1640  LABPROT 16.7*  INR 1.33   Radiology: Dg Chest Portable 1 View In Am  09/13/2011  *RADIOLOGY REPORT*  Clinical Data: Coronary artery bypass grafting  PORTABLE CHEST - 1 VIEW  Comparison: 09/12/2011  Findings: Swan-Ganz catheter has been removed.  The right IJ catheter sheath is in the projection of the SVC.  Heart size is enlarged.  There are postop changes compatible with CABG. The right- sided chest tube has been removed.  No pneumothorax is visualized. Lung volumes are low.  Persistent left pleural effusion. Atelectasis in both lung bases appears unchanged.  IMPRESSION:  1.  Removal of right-sided chest tube and Swan-Ganz catheter. 2.  No change in left effusion and bibasilar atelectasis.   Original Report Authenticated By: Rosealee Albee, M.D.    Dg Chest Portable 1 View In Am  09/12/2011  *RADIOLOGY REPORT*  Clinical Data: Post redo coronary artery bypass grafting  PORTABLE CHEST - 1 VIEW  Comparison: Portable exam 0610 hours compared to 09/11/2011  Findings: Interval removal of endotracheal and nasogastric tubes. Right jugular Swan-Ganz catheter with tip projecting over right pulmonary artery at right hilum. Enlargement of cardiac silhouette post CABG. Pulmonary vascular congestion. Bibasilar opacities likely representing atelectasis. Small left pleural effusion. Right thoracostomy tube unchanged. No pneumothorax.  IMPRESSION: Left pleural effusion and probable bibasilar atelectasis.   Original Report Authenticated By: Lollie Marrow, M.D.    Dg Chest Portable 1 View  09/11/2011  *RADIOLOGY REPORT*  Clinical Data: Postop CABG, two needles missing from count  PORTABLE CHEST - 1 VIEW  Comparison: Chest x-ray of 09/08/2011  Findings: The tip of the endotracheal tube is approximately 4.8 cm above the carina.  The Swan-Ganz catheter is present with the tip in  the right main pulmonary artery A right chest tube is noted with no pneumothorax.  The lungs are not well aerated with perhaps mild pulmonary vascular congestion present.  Cardiomegaly is stable.  No definite retained needle is seen.  Two thin filament- like wires overlie the right lower medial chest of questionable significance.  IMPRESSION:  1.  Poor aeration with cardiomegaly and probable mild pulmonary vascular congestion. 2.  Tip of endotracheal tube is 4.8 cm above the carina. 3.  Right chest tube present.  No pneumothorax. 4.  Two thin metallic wires overlie the lower medial right chest of questionable significance.   Original Report Authenticated By: Juline Patch, M.D.      Assessment/Plan: S/P Procedure(s) (LRB): REDO CORONARY ARTERY BYPASS GRAFTING (CABG) (  N/A) TRANSESOPHAGEAL ECHOCARDIOGRAM (TEE) (N/A) Mobilize Diuresis Diabetes control new onset of afib now rate controlled on cardiezem Keep in unit today    Delight Ovens MD  Beeper [phone] Office [phone] 09/13/2011 8:35 AM

## 2011-09-14 ENCOUNTER — Inpatient Hospital Stay (HOSPITAL_COMMUNITY): Payer: Medicare Other

## 2011-09-14 ENCOUNTER — Encounter (HOSPITAL_COMMUNITY): Payer: Self-pay

## 2011-09-14 LAB — CBC
HCT: 30.8 % — ABNORMAL LOW (ref 39.0–52.0)
Hemoglobin: 10.5 g/dL — ABNORMAL LOW (ref 13.0–17.0)
MCH: 30.5 pg (ref 26.0–34.0)
MCHC: 34.1 g/dL (ref 30.0–36.0)
MCV: 89.5 fL (ref 78.0–100.0)
Platelets: 111 10*3/uL — ABNORMAL LOW (ref 150–400)
RBC: 3.44 MIL/uL — ABNORMAL LOW (ref 4.22–5.81)
RDW: 13.9 % (ref 11.5–15.5)
WBC: 15.3 10*3/uL — ABNORMAL HIGH (ref 4.0–10.5)

## 2011-09-14 LAB — GLUCOSE, CAPILLARY
Glucose-Capillary: 142 mg/dL — ABNORMAL HIGH (ref 70–99)
Glucose-Capillary: 130 mg/dL — ABNORMAL HIGH (ref 70–99)
Glucose-Capillary: 140 mg/dL — ABNORMAL HIGH (ref 70–99)
Glucose-Capillary: 142 mg/dL — ABNORMAL HIGH (ref 70–99)
Glucose-Capillary: 123 mg/dL — ABNORMAL HIGH (ref 70–99)
Glucose-Capillary: 122 mg/dL — ABNORMAL HIGH (ref 70–99)

## 2011-09-14 LAB — BASIC METABOLIC PANEL
BUN: 12 mg/dL (ref 6–23)
CO2: 30 mEq/L (ref 19–32)
Calcium: 9 mg/dL (ref 8.4–10.5)
Chloride: 98 mEq/L (ref 96–112)
Creatinine, Ser: 0.74 mg/dL (ref 0.50–1.35)
GFR calc Af Amer: >90 mL/min (ref >90)
GFR calc non Af Amer: >90 mL/min (ref >90)
Glucose, Bld: 150 mg/dL — ABNORMAL HIGH (ref 70–99)
Potassium: 4 mEq/L (ref 3.5–5.1)
Sodium: 136 mEq/L (ref 135–145)

## 2011-09-14 MED ORDER — EZETIMIBE 10 MG PO TABS
10.0000 mg | ORAL_TABLET | Freq: Every day | ORAL | Status: DC
  Administered 2011-09-14 – 2011-09-21 (×8): 10 mg via ORAL
  Filled 2011-09-14 (×9): qty 1

## 2011-09-14 MED ORDER — ENOXAPARIN SODIUM 40 MG/0.4ML ~~LOC~~ SOLN
40.0000 mg | Freq: Two times a day (BID) | SUBCUTANEOUS | Status: DC
  Administered 2011-09-14 – 2011-09-21 (×15): 40 mg via SUBCUTANEOUS
  Filled 2011-09-14 (×16): qty 0.4

## 2011-09-14 MED ORDER — LACTULOSE 10 GM/15ML PO SOLN
20.0000 g | Freq: Once | ORAL | Status: DC
  Filled 2011-09-14: qty 30

## 2011-09-14 MED ORDER — ATORVASTATIN CALCIUM 10 MG PO TABS
10.0000 mg | ORAL_TABLET | Freq: Every day | ORAL | Status: DC
  Administered 2011-09-14 – 2011-09-20 (×7): 10 mg via ORAL
  Filled 2011-09-14 (×8): qty 1

## 2011-09-14 NOTE — Progress Notes (Signed)
Patient ID: Nicholas Lynn, male   DOB: 12-12-1945, 66 y.o.   MRN: [id number]  Filed Vitals:   09/14/11 2021 09/14/11 2100 09/14/11 2200 09/14/11 2300  BP:  124/66 98/65 117/69  Pulse:  130 106 95  Temp: 99 F (37.2 C)     TempSrc: Oral     Resp:  20 15 14   Height:      Weight:      SpO2:  100% 100% 100%   In A-fib on amio.  Urine output ok  BMET    Component Value Date/Time   NA 136 09/14/2011 0645   K 4.0 09/14/2011 0645   CL 98 09/14/2011 0645   CO2 30 09/14/2011 0645   GLUCOSE 150* 09/14/2011 0645   BUN 12 09/14/2011 0645   CREATININE 0.74 09/14/2011 0645   CALCIUM 9.0 09/14/2011 0645   GFRNONAA >90 09/14/2011 0645   GFRAA >90 09/14/2011 0645    CBC    Component Value Date/Time   WBC 15.3* 09/14/2011 0645   RBC 3.44* 09/14/2011 0645   HGB 10.5* 09/14/2011 0645   HCT 30.8* 09/14/2011 0645   PLT 111* 09/14/2011 0645   MCV 89.5 09/14/2011 0645   MCH 30.5 09/14/2011 0645   MCHC 34.1 09/14/2011 0645   RDW 13.9 09/14/2011 0645   LYMPHSABS 2.9 03/27/2011 1100   MONOABS 1.3* 03/27/2011 1100   EOSABS 0.1 03/27/2011 1100   BASOSABS 0.0 03/27/2011 1100    A/P: stable day.

## 2011-09-14 NOTE — Progress Notes (Addendum)
3 Days Post-Op Procedure(s) (LRB): REDO CORONARY ARTERY BYPASS GRAFTING (CABG) (N/A) TRANSESOPHAGEAL ECHOCARDIOGRAM (TEE) (N/A)  Subjective: Patient feeling a little better than yesterday. Passing flatus but no bowel movement.  Objective: Vital signs in last 24 hours: Patient Vitals for the past 24 hrs:  BP Temp Temp src Pulse Resp SpO2 Height Weight  09/14/11 0744 - 97.7 F (36.5 C) Oral - - - - -  09/14/11 0700 99/59 mmHg - - 78  15  100 % - -  09/14/11 0600 117/96 mmHg - - 65  13  97 % - 285 lb 7.9 oz (129.5 kg)  09/14/11 0500 115/58 mmHg - - 71  13  98 % - -  09/14/11 0400 120/92 mmHg 98.6 F (37 C) Oral 81  20  95 % - -  09/14/11 0300 109/57 mmHg - - 92  12  97 % - -  09/14/11 0200 109/51 mmHg - - 92  16  96 % - -  09/14/11 0100 119/59 mmHg - - 64  12  97 % - -  09/14/11 0000 100/54 mmHg 97.8 F (36.6 C) Oral 88  19  95 % - -  09/13/11 2300 106/53 mmHg - - 63  13  96 % - -  09/13/11 2200 101/50 mmHg - - 83  13  98 % - -  09/13/11 2100 104/72 mmHg - - 64  13  96 % - -  09/13/11 2009 - 98.2 F (36.8 C) Oral - - - - -  09/13/11 2000 106/51 mmHg - - 97  17  93 % - -  09/13/11 1900 104/52 mmHg - - 92  13  97 % - -  09/13/11 1800 106/55 mmHg - - 59  18  91 % - -  09/13/11 1700 116/45 mmHg - - 87  19  95 % - -  09/13/11 1600 93/52 mmHg - - 68  14  100 % - -  09/13/11 1500 103/58 mmHg - - 83  13  100 % - -  09/13/11 1400 100/66 mmHg - - 78  17  98 % - -  09/13/11 1300 105/64 mmHg - - 136  14  93 % - -  09/13/11 1200 92/51 mmHg - - 65  12  86 % - -  09/13/11 1136 - 97.9 F (36.6 C) Oral - - - - -  09/13/11 1100 102/37 mmHg - - 126  16  90 % - -  09/13/11 1000 114/71 mmHg - - 60  17  94 % - -  09/13/11 0907 - - - - - - 6' 2.02" (1.88 m) 282 lb 6.6 oz (128.1 kg)  09/13/11 0900 122/76 mmHg - - 96  18  92 % - -  09/13/11 0800 116/57 mmHg - - 63  12  96 % - -   Pre op weight  123 kg Current Weight  09/14/11 285 lb 7.9 oz (129.5 kg)      Intake/Output from previous  day: 08/21 0701 - 08/22 0700 In: 2152.5 [P.O.:900; I.V.:1198.5; IV Piggyback:54] Out: 2550 [Urine:2550]   Physical Exam:  Cardiovascular: IRRR, IRRR;no murmurs, gallops, or rubs. Pulmonary: Diminished at bases; no rales, wheezes, or rhonchi. Abdomen: Soft, non tender, bowel sounds present. Extremities: Bilateral lower extremity edema. Wounds: Clean and dry.  Minor sero sanginous ooze right thigh wound.  Lab Results: CBC: Basename 09/14/11 0645 09/13/11 0420  WBC 15.3* 19.4*  HGB 10.5* 11.0*  HCT 30.8* 33.3*  PLT 111* 106*  BMET:  Basename 09/14/11 0645 09/13/11 0420  NA 136 131*  K 4.0 4.6  CL 98 98  CO2 30 29  GLUCOSE 150* 153*  BUN 12 12  CREATININE 0.74 0.87  CALCIUM 9.0 8.8    PT/INR:  Lab Results  Component Value Date   INR 1.33 09/11/2011   INR 1.00 09/08/2011   INR 1.01 03/27/2011   ABG:  INR: Will add last result for INR, ABG once components are confirmed Will add last 4 CBG results once components are confirmed  Assessment/Plan:  1. CV - Post op afib with RVR.Afib this am with rate into low 100's. On Lopressor 12.5 bid, Amiodarone gttp, and Cardizem gttp.SBP in the 90's. Likely need to stop at least 1 drip. Consider holding BB his am. 2.  Pulmonary - Encourage incentive spirometer.CXR this am shows no ptx, bibasilar atelectasis, low lung volumes, bilateral pleural effusions L>R, and some pulmonary vascular congestion. 3. Volume Overload - Needs diuresis, but BP labile. 4.  Acute blood loss anemia - H and H this 10.5 and 30.8. 5.Thrombocytopenia-platelets stable at 111,000. 6.Pre op HGA1C 6.1. CBGs 147/142/130. May need to start oral agent.Will need follow up as outpatient. 7.Lactulose for constipation. 8.Stopped Zocor (on amiodarone), continue Zetia, and start Lipitor  ZIMMERMAN,DONIELLE MPA-C 09/14/2011   still on hr of Cordarone Rate better controlled, but still afib D/c Cardizem drip because of BP Consider early cardioversion if does not  convert soon I have seen and examined [name] and agree with the above assessment  and plan.  Delight Ovens MD Beeper [phone] Office [phone] 09/14/2011 9:04 AM

## 2011-09-14 NOTE — Progress Notes (Signed)
Subjective:  Up in chair, still in AF but rate is better  Objective:  Vital Signs in the last 24 hours: Temp:  [97.7 F (36.5 C)-98.6 F (37 C)] 97.7 F (36.5 C) (08/22 0800) Pulse Rate:  [59-136] 93  (08/22 0800) Resp:  [12-20] 18  (08/22 0800) BP: (87-120)/(37-96) 87/51 mmHg (08/22 0800) SpO2:  [86 %-100 %] 100 % (08/22 0800) Weight:  [129.5 kg (285 lb 7.9 oz)] 129.5 kg (285 lb 7.9 oz) (08/22 0600)  Intake/Output from previous day:  Intake/Output Summary (Last 24 hours) at 09/14/11 0916 Last data filed at 09/14/11 0800  Gross per 24 hour  Intake 2047.06 ml  Output   2100 ml  Net -52.94 ml    Physical Exam: General appearance: alert, cooperative and no distress Lungs: decreased breath sounds Heart: irregularly irregular rhythm   Rate: 100  Rhythm: atrial fibrillation  Lab Results:  Basename 09/14/11 0645 09/13/11 0420  WBC 15.3* 19.4*  HGB 10.5* 11.0*  PLT 111* 106*    Basename 09/14/11 0645 09/13/11 0420  NA 136 131*  K 4.0 4.6  CL 98 98  CO2 30 29  GLUCOSE 150* 153*  BUN 12 12  CREATININE 0.74 0.87   No results found for this basename: TROPONINI:2,CK,MB:2 in the last 72 hours Hepatic Function Panel No results found for this basename: PROT,ALBUMIN,AST,ALT,ALKPHOS,BILITOT,BILIDIR,IBILI in the last 72 hours No results found for this basename: CHOL in the last 72 hours  Basename 09/11/11 1640  INR 1.33    Imaging: Dg Chest [place] 1 View  09/14/2011  *RADIOLOGY REPORT*  Clinical Data: CABG procedure.  PORTABLE CHEST - 1 VIEW  Comparison: 09/13/2011  Findings: The cardiac silhouette is enlarged.  Right jugular central venous catheter appears stable.  Persistent bibasilar densities are suggestive for atelectasis and small effusions.  No evidence for a pneumothorax.  There may be a small focus of airspace disease in the left upper lung but this could also represent overlying structures.  IMPRESSION: Persistent basilar densities are suggestive for atelectasis and  small effusions.  Cannot exclude a small focus of airspace disease in the left upper lung.  Cardiomegaly.   Original Report Authenticated By: Richarda Overlie, M.D.    Dg Chest Portable 1 View In Am  09/13/2011  *RADIOLOGY REPORT*  Clinical Data: Coronary artery bypass grafting  PORTABLE CHEST - 1 VIEW  Comparison: 09/12/2011  Findings: Swan-Ganz catheter has been removed.  The right IJ catheter sheath is in the projection of the SVC.  Heart size is enlarged.  There are postop changes compatible with CABG. The right- sided chest tube has been removed.  No pneumothorax is visualized. Lung volumes are low.  Persistent left pleural effusion. Atelectasis in both lung bases appears unchanged.  IMPRESSION:  1.  Removal of right-sided chest tube and Swan-Ganz catheter. 2.  No change in left effusion and bibasilar atelectasis.   Original Report Authenticated By: Rosealee Albee, M.D.     Cardiac Studies:  Assessment/Plan:   Principal Problem:  *CAD (coronary artery disease), CABG '96, re do CABG 09/11/11 X 3 Active Problems:  Atrial fibrillation with RVR post op re do CABG, (Sinus bradycardia pre op)  HTN (hypertension), now hypotensive post op in AF  Sleep apnea, on C-pap   Ischemic cardiomyopathy, "moderate LVD" at cath 09/01/11  Bradycardia, beta blocker decreased 3/13  Dyslipidemia  Plan- Now on Amiodarone drip, off Diltiazem secondary to low B/P. He has SSS, he was in sinus bradycardia prior to surgery.    Nicholas Lynn  Kilroy PA-C 09/14/2011, 9:16 AM  I have seen and examined the patient along with Corine Shelter PA-C.  I have reviewed the chart, notes and new data.  I agree with PA's note.  Key new complaints: mild incisional discomfort Key examination changes: lower extremity edema; Weight +6kg from admission; AF w rate around 100 bpm Key new findings / data: minimal postop anemia, normal renal function and electrolytes.  PLAN: Change amiodarone to PO in about 24 h Diuretics slowly due to soft BP High  risk arrhythmia recurrence due to CAD and depressed LVEF. Would recommend warfarin at discharge, even if back to NSR on amio.  Thurmon Fair, MD, [facility] [phone] 09/14/2011, 10:10 AM

## 2011-09-15 ENCOUNTER — Inpatient Hospital Stay (HOSPITAL_COMMUNITY): Payer: Medicare Other

## 2011-09-15 LAB — GLUCOSE, CAPILLARY
Glucose-Capillary: 108 mg/dL — ABNORMAL HIGH (ref 70–99)
Glucose-Capillary: 108 mg/dL — ABNORMAL HIGH (ref 70–99)
Glucose-Capillary: 116 mg/dL — ABNORMAL HIGH (ref 70–99)
Glucose-Capillary: 120 mg/dL — ABNORMAL HIGH (ref 70–99)
Glucose-Capillary: 132 mg/dL — ABNORMAL HIGH (ref 70–99)
Glucose-Capillary: 136 mg/dL — ABNORMAL HIGH (ref 70–99)

## 2011-09-15 LAB — BASIC METABOLIC PANEL
BUN: 12 mg/dL (ref 6–23)
CO2: 32 mEq/L (ref 19–32)
Calcium: 8.9 mg/dL (ref 8.4–10.5)
Chloride: 99 mEq/L (ref 96–112)
Creatinine, Ser: 0.66 mg/dL (ref 0.50–1.35)
GFR calc Af Amer: >90 mL/min (ref >90)
GFR calc non Af Amer: >90 mL/min (ref >90)
Glucose, Bld: 132 mg/dL — ABNORMAL HIGH (ref 70–99)
Potassium: 3.8 mEq/L (ref 3.5–5.1)
Sodium: 137 mEq/L (ref 135–145)

## 2011-09-15 LAB — CBC
HCT: 27.9 % — ABNORMAL LOW (ref 39.0–52.0)
Hemoglobin: 9.4 g/dL — ABNORMAL LOW (ref 13.0–17.0)
MCH: 29.9 pg (ref 26.0–34.0)
MCHC: 33.7 g/dL (ref 30.0–36.0)
MCV: 88.9 fL (ref 78.0–100.0)
Platelets: 125 10*3/uL — ABNORMAL LOW (ref 150–400)
RBC: 3.14 MIL/uL — ABNORMAL LOW (ref 4.22–5.81)
RDW: 13.7 % (ref 11.5–15.5)
WBC: 10.2 10*3/uL (ref 4.0–10.5)

## 2011-09-15 MED ORDER — ASPIRIN EC 81 MG PO TBEC
81.0000 mg | DELAYED_RELEASE_TABLET | Freq: Every day | ORAL | Status: DC
  Administered 2011-09-15 – 2011-09-21 (×7): 81 mg via ORAL
  Filled 2011-09-15 (×7): qty 1

## 2011-09-15 MED ORDER — MAGNESIUM HYDROXIDE 400 MG/5ML PO SUSP
30.0000 mL | Freq: Every day | ORAL | Status: DC | PRN
  Administered 2011-09-19: 30 mL via ORAL
  Filled 2011-09-15: qty 30

## 2011-09-15 MED ORDER — BISACODYL 10 MG RE SUPP: 10.0000 mg | Freq: Every day | RECTAL | Status: DC | PRN

## 2011-09-15 MED ORDER — SODIUM CHLORIDE 0.9 % IJ SOLN: 3.0000 mL | INTRAMUSCULAR | Status: DC | PRN

## 2011-09-15 MED ORDER — DILTIAZEM HCL 100 MG IV SOLR
5.0000 mg/h | INTRAVENOUS | Status: DC
  Administered 2011-09-15: 10 mg/h via INTRAVENOUS
  Filled 2011-09-15 (×2): qty 100

## 2011-09-15 MED ORDER — WARFARIN SODIUM 5 MG PO TABS
5.0000 mg | ORAL_TABLET | Freq: Once | ORAL | Status: AC
  Administered 2011-09-15: 5 mg via ORAL
  Filled 2011-09-15: qty 1

## 2011-09-15 MED ORDER — DOCUSATE SODIUM 100 MG PO CAPS
200.0000 mg | ORAL_CAPSULE | Freq: Every day | ORAL | Status: DC
  Administered 2011-09-15 – 2011-09-21 (×7): 200 mg via ORAL
  Filled 2011-09-15 (×7): qty 2

## 2011-09-15 MED ORDER — MOVING RIGHT ALONG BOOK
Freq: Once | Status: AC
  Administered 2011-09-15: 09:00:00
  Filled 2011-09-15: qty 1

## 2011-09-15 MED ORDER — AMIODARONE IV BOLUS ONLY 150 MG/100ML
150.0000 mg | Freq: Once | INTRAVENOUS | Status: AC
  Administered 2011-09-15: 150 mg via INTRAVENOUS
  Filled 2011-09-15 (×2): qty 100

## 2011-09-15 MED ORDER — METOPROLOL TARTRATE 25 MG PO TABS
25.0000 mg | ORAL_TABLET | Freq: Once | ORAL | Status: AC
  Administered 2011-09-15: 25 mg via ORAL
  Filled 2011-09-15: qty 1

## 2011-09-15 MED ORDER — WARFARIN - PHYSICIAN DOSING INPATIENT
Freq: Every day | Status: DC
  Administered 2011-09-18 – 2011-09-20 (×3)

## 2011-09-15 MED ORDER — METOPROLOL TARTRATE 25 MG PO TABS
25.0000 mg | ORAL_TABLET | Freq: Two times a day (BID) | ORAL | Status: DC
  Administered 2011-09-15 – 2011-09-21 (×12): 25 mg via ORAL
  Filled 2011-09-15 (×14): qty 1

## 2011-09-15 MED ORDER — DILTIAZEM LOAD VIA INFUSION
10.0000 mg | Freq: Once | INTRAVENOUS | Status: AC
  Administered 2011-09-15: 10 mg via INTRAVENOUS
  Filled 2011-09-15: qty 10

## 2011-09-15 MED ORDER — ONDANSETRON HCL 4 MG PO TABS: 4.0000 mg | ORAL_TABLET | Freq: Four times a day (QID) | ORAL | Status: DC | PRN

## 2011-09-15 MED ORDER — GUAIFENESIN ER 600 MG PO TB12
600.0000 mg | ORAL_TABLET | Freq: Two times a day (BID) | ORAL | Status: DC | PRN
  Filled 2011-09-15: qty 1

## 2011-09-15 MED ORDER — TRAMADOL HCL 50 MG PO TABS: 50.0000 mg | ORAL_TABLET | ORAL | Status: DC | PRN

## 2011-09-15 MED ORDER — FUROSEMIDE 40 MG PO TABS
40.0000 mg | ORAL_TABLET | Freq: Every day | ORAL | Status: AC
  Administered 2011-09-15 – 2011-09-17 (×3): 40 mg via ORAL
  Filled 2011-09-15 (×3): qty 1

## 2011-09-15 MED ORDER — PANTOPRAZOLE SODIUM 40 MG PO TBEC
40.0000 mg | DELAYED_RELEASE_TABLET | Freq: Every day | ORAL | Status: DC
  Administered 2011-09-15 – 2011-09-21 (×7): 40 mg via ORAL
  Filled 2011-09-15 (×8): qty 1

## 2011-09-15 MED ORDER — POTASSIUM CHLORIDE CRYS ER 20 MEQ PO TBCR
20.0000 meq | EXTENDED_RELEASE_TABLET | Freq: Every day | ORAL | Status: AC
  Administered 2011-09-15 – 2011-09-16 (×2): 20 meq via ORAL
  Filled 2011-09-15 (×3): qty 1

## 2011-09-15 MED ORDER — AMIODARONE HCL 200 MG PO TABS
400.0000 mg | ORAL_TABLET | Freq: Two times a day (BID) | ORAL | Status: DC
  Administered 2011-09-15 – 2011-09-21 (×13): 400 mg via ORAL
  Filled 2011-09-15 (×14): qty 2

## 2011-09-15 MED ORDER — SODIUM CHLORIDE 0.9 % IJ SOLN
3.0000 mL | Freq: Two times a day (BID) | INTRAMUSCULAR | Status: DC
  Administered 2011-09-15 – 2011-09-21 (×12): 3 mL via INTRAVENOUS

## 2011-09-15 MED ORDER — OXYCODONE HCL 5 MG PO TABS
5.0000 mg | ORAL_TABLET | ORAL | Status: DC | PRN
  Administered 2011-09-15 – 2011-09-21 (×22): 10 mg via ORAL
  Filled 2011-09-15 (×22): qty 2

## 2011-09-15 MED ORDER — SODIUM CHLORIDE 0.9 % IV SOLN: 250.0000 mL | INTRAVENOUS | Status: DC | PRN

## 2011-09-15 MED ORDER — INSULIN ASPART 100 UNIT/ML ~~LOC~~ SOLN
0.0000 [IU] | Freq: Three times a day (TID) | SUBCUTANEOUS | Status: DC
  Administered 2011-09-16 – 2011-09-20 (×4): 2 [IU] via SUBCUTANEOUS

## 2011-09-15 MED ORDER — BISACODYL 5 MG PO TBEC
10.0000 mg | DELAYED_RELEASE_TABLET | Freq: Every day | ORAL | Status: DC | PRN
  Administered 2011-09-20: 10 mg via ORAL
  Filled 2011-09-15 (×3): qty 2

## 2011-09-15 MED ORDER — ONDANSETRON HCL 4 MG/2ML IJ SOLN: 4.0000 mg | Freq: Four times a day (QID) | INTRAMUSCULAR | Status: DC | PRN

## 2011-09-15 NOTE — Progress Notes (Addendum)
Pt with atrial fib on transfer 125-150s and holding steady 135-140 on monitor,  bp 135/69 dr Tyrone Sage made aware and orders received. Ryana Montecalvo, Randall An RN

## 2011-09-15 NOTE — Progress Notes (Signed)
Patient ID: Nicholas Lynn, male   DOB: February 04, 1945, 66 y.o.   MRN: [id number] TCTS DAILY PROGRESS NOTE                   [address]            [place] [address]          [phone]      4 Days Post-Op Procedure(s) (LRB): REDO CORONARY ARTERY BYPASS GRAFTING (CABG) (N/A) TRANSESOPHAGEAL ECHOCARDIOGRAM (TEE) (N/A)  Total Length of Stay:  LOS: 4 days   Subjective: Feels better, still in afib   Objective: Vital signs in last 24 hours: Temp:  [97.8 F (36.6 C)-99 F (37.2 C)] 98.6 F (37 C) (08/23 0742) Pulse Rate:  [53-130] 84  (08/23 0700) Cardiac Rhythm:  [-] Atrial fibrillation (08/23 0400) Resp:  [11-22] 20  (08/23 0700) BP: (88-132)/(49-97) 114/97 mmHg (08/23 0700) SpO2:  [79 %-100 %] 99 % (08/23 0700) Weight:  [285 lb 7.9 oz (129.5 kg)] 285 lb 7.9 oz (129.5 kg) (08/23 0600)  Filed Weights   09/13/11 0907 09/14/11 0600 09/15/11 0600  Weight: 282 lb 6.6 oz (128.1 kg) 285 lb 7.9 oz (129.5 kg) 285 lb 7.9 oz (129.5 kg)    Weight change: 3 lb 1.4 oz (1.4 kg)   Hemodynamic parameters for last 24 hours:    Intake/Output from previous day: 08/22 0701 - 08/23 0700 In: 1407.3 [P.O.:910; I.V.:497.3] Out: 2100 [Urine:2100]  Intake/Output this shift:    Current Meds: Scheduled Meds:   . acetaminophen  1,000 mg Oral Q6H   Or  . acetaminophen (TYLENOL) oral liquid 160 mg/5 mL  975 mg Per Tube Q6H  . amiodarone  150 mg Intravenous Once  . aspirin EC  325 mg Oral Daily   Or  . aspirin  324 mg Per Tube Daily  . atorvastatin  10 mg Oral q1800  . bisacodyl  10 mg Oral Daily   Or  . bisacodyl  10 mg Rectal Daily  . docusate sodium  200 mg Oral Daily  . enoxaparin  40 mg Subcutaneous Q12H  . ezetimibe  10 mg Oral Daily  . insulin aspart  0-24 Units Subcutaneous Q4H  . lactulose  20 g Oral Once  . metoprolol tartrate  12.5 mg Oral BID   Or  . metoprolol tartrate  12.5 mg Per Tube BID  . pantoprazole  40 mg Oral Q1200  . sodium chloride  3 mL  Intravenous Q12H  . DISCONTD: enoxaparin  40 mg Subcutaneous Q24H  . DISCONTD: insulin regular  0-10 Units Intravenous TID WC   Continuous Infusions:   . sodium chloride Stopped (09/13/11 1715)  . sodium chloride Stopped (09/13/11 1300)  . sodium chloride    . amiodarone (NEXTERONE PREMIX) 360 mg/200 mL dextrose 30 mg/hr (09/15/11 0700)  . lactated ringers 20 mL (09/12/11 1000)  . phenylephrine (NEO-SYNEPHRINE) Adult infusion Stopped (09/11/11 1630)  . DISCONTD: diltiazem (CARDIZEM) infusion 15 mg/hr (09/14/11 0800)  . DISCONTD: DOPamine Stopped (09/12/11 0100)  . DISCONTD: insulin (NOVOLIN-R) infusion Stopped (09/12/11 0800)  . DISCONTD: milrinone Stopped (09/12/11 0952)  . DISCONTD: nitroGLYCERIN Stopped (09/12/11 1200)   PRN Meds:.metoprolol, morphine injection, ondansetron (ZOFRAN) IV, oxyCODONE, sodium chloride  General appearance: alert, cooperative and no distress Neurologic: intact Heart: irregularly irregular rhythm Lungs: clear to auscultation bilaterally and normal percussion bilaterally Abdomen: soft, non-tender; bowel sounds normal; no masses,  no organomegaly Extremities: extremities normal, atraumatic, no cyanosis or edema and Homans sign is  negative, no sign of DVT Wound: sternum stable  Lab Results: CBC: Basename 09/15/11 0428 09/14/11 0645  WBC 10.2 15.3*  HGB 9.4* 10.5*  HCT 27.9* 30.8*  PLT 125* 111*   BMET:  Basename 09/15/11 0428 09/14/11 0645  NA 137 136  K 3.8 4.0  CL 99 98  CO2 32 30  GLUCOSE 132* 150*  BUN 12 12  CREATININE 0.66 0.74  CALCIUM 8.9 9.0    PT/INR: No results found for this basename: LABPROT,INR in the last 72 hours Radiology: Dg Chest Port 1 View  09/15/2011  *RADIOLOGY REPORT*  Clinical Data: Postop.  PORTABLE CHEST - 1 VIEW  Comparison: 09/14/2011  Findings: Right jugular central line remains in place.  Cardiac silhouette remains markedly enlarged.  Coarse interstitial markings have minimally changed. Previous exam raised  concern for a developing density in the left upper lung but there is no focal opacity on today's examination.  Trachea is midline.  Overall, there are low lung volumes with basilar densities.  Negative for a pneumothorax.  IMPRESSION: Cardiomegaly with low lung volumes.  Basilar densities could represent atelectasis and small effusions.   Original Report Authenticated By: Richarda Overlie, M.D.    Dg Chest Port 1 View  09/14/2011  *RADIOLOGY REPORT*  Clinical Data: CABG procedure.  PORTABLE CHEST - 1 VIEW  Comparison: 09/13/2011  Findings: The cardiac silhouette is enlarged.  Right jugular central venous catheter appears stable.  Persistent bibasilar densities are suggestive for atelectasis and small effusions.  No evidence for a pneumothorax.  There may be a small focus of airspace disease in the left upper lung but this could also represent overlying structures.  IMPRESSION: Persistent basilar densities are suggestive for atelectasis and small effusions.  Cannot exclude a small focus of airspace disease in the left upper lung.  Cardiomegaly.   Original Report Authenticated By: Richarda Overlie, M.D.      Assessment/Plan: S/P Procedure(s) (LRB): REDO CORONARY ARTERY BYPASS GRAFTING (CABG) (N/A) TRANSESOPHAGEAL ECHOCARDIOGRAM (TEE) (N/A) Mobilize Diuresis Plan for transfer to step-down: see transfer orders change to po pacerone Since still in afib will start coumadin     Delight Ovens MD  Beeper [phone] Office [phone] 09/15/2011 8:15 AM

## 2011-09-15 NOTE — Plan of Care (Signed)
Problem: Phase III Progression Outcomes Goal: Time patient transferred to PCTU/Telemetry POD Outcome: Completed/Met Date Met:  09/15/11 Pt transferred to PCTU 09/15/2011 @ 16:00, POD2

## 2011-09-15 NOTE — Progress Notes (Addendum)
Pt in atrial fib still rate 130s, pt will dip down to 115 but will not sustain at that rate. Dr gerhardt made aware and orders received will continue to monitor patient BP 120/79. Nicholas Lynn, Randall An RN

## 2011-09-15 NOTE — Progress Notes (Signed)
Subjective:  No complaints, sitting up in bed  Objective:  Vital Signs in the last 24 hours: Temp:  [97.8 F (36.6 C)-99 F (37.2 C)] 98.6 F (37 C) (08/23 0742) Pulse Rate:  [53-130] 71  (08/23 0800) Resp:  [11-22] 14  (08/23 0800) BP: (88-132)/(49-111) 129/111 mmHg (08/23 0800) SpO2:  [79 %-100 %] 100 % (08/23 0800) Weight:  [129.5 kg (285 lb 7.9 oz)] 129.5 kg (285 lb 7.9 oz) (08/23 0600)  Intake/Output from previous day:  Intake/Output Summary (Last 24 hours) at 09/15/11 5284 Last data filed at 09/15/11 0800  Gross per 24 hour  Intake 1227.4 ml  Output   2100 ml  Net -872.6 ml    Physical Exam: General appearance: alert, cooperative and no distress Lungs: decreased breath sounds at bases Heart: irregularly irregular rhythm   Rate: 110  Rhythm: atrial fibrillation  Lab Results:  Basename 09/15/11 0428 09/14/11 0645  WBC 10.2 15.3*  HGB 9.4* 10.5*  PLT 125* 111*    Basename 09/15/11 0428 09/14/11 0645  NA 137 136  K 3.8 4.0  CL 99 98  CO2 32 30  GLUCOSE 132* 150*  BUN 12 12  CREATININE 0.66 0.74   No results found for this basename: TROPONINI:2,CK,MB:2 in the last 72 hours Hepatic Function Panel No results found for this basename: PROT,ALBUMIN,AST,ALT,ALKPHOS,BILITOT,BILIDIR,IBILI in the last 72 hours No results found for this basename: CHOL in the last 72 hours No results found for this basename: INR in the last 72 hours  Imaging: Imaging results have been reviewed  Cardiac Studies:  Assessment/Plan:   Principal Problem:  *CAD (coronary artery disease), CABG '96, re do CABG 09/11/11 X 3 Active Problems:  Atrial fibrillation with RVR post op re do CABG, (Sinus bradycardia pre op)  HTN (hypertension), now hypotensive post op in AF  Sleep apnea, on C-pap   Ischemic cardiomyopathy, "moderate LVD" at cath 09/01/11  Bradycardia, beta blocker decreased 3/13  Dyslipidemia   Plan- Amiodarone being managed by Dr Tyrone Sage. Consider adding short acting  Diltiazem now that his B/P is better. He has a tendency to SSS so we will need to watch for this as he slows down or converts to NSR.   Corine Shelter PA-C 09/15/2011, 8:38 AM

## 2011-09-15 NOTE — Progress Notes (Signed)
Pt. Seen and examined. Agree with the NP/PA-C note as written.  Feels well. HR is around 100-110. Agree that he may tolerate low dose cardizem 30 mg po TID. Transition to po amiodarone 400 mg BID for 2 weeks, then can decrease to 400 mg daily. Warfarin with therapeutic INR for 1 month and if he remains in a-fib at that time, could be electively cardioverted.  At this point in time, he is high risk for re-current a-fib.  Chrystie Nose, MD, [facility] Attending Cardiologist [facility]

## 2011-09-16 LAB — BASIC METABOLIC PANEL
BUN: 13 mg/dL (ref 6–23)
CO2: 32 mEq/L (ref 19–32)
Calcium: 9.1 mg/dL (ref 8.4–10.5)
Chloride: 99 mEq/L (ref 96–112)
Creatinine, Ser: 0.79 mg/dL (ref 0.50–1.35)
GFR calc Af Amer: >90 mL/min (ref >90)
GFR calc non Af Amer: >90 mL/min (ref >90)
Glucose, Bld: 116 mg/dL — ABNORMAL HIGH (ref 70–99)
Potassium: 3.5 mEq/L (ref 3.5–5.1)
Sodium: 139 mEq/L (ref 135–145)

## 2011-09-16 LAB — GLUCOSE, CAPILLARY
Glucose-Capillary: 118 mg/dL — ABNORMAL HIGH (ref 70–99)
Glucose-Capillary: 101 mg/dL — ABNORMAL HIGH (ref 70–99)
Glucose-Capillary: 115 mg/dL — ABNORMAL HIGH (ref 70–99)
Glucose-Capillary: 125 mg/dL — ABNORMAL HIGH (ref 70–99)

## 2011-09-16 LAB — CBC
HCT: 27.5 % — ABNORMAL LOW (ref 39.0–52.0)
Hemoglobin: 9.2 g/dL — ABNORMAL LOW (ref 13.0–17.0)
MCH: 29.9 pg (ref 26.0–34.0)
MCHC: 33.5 g/dL (ref 30.0–36.0)
MCV: 89.3 fL (ref 78.0–100.0)
Platelets: 154 10*3/uL (ref 150–400)
RBC: 3.08 MIL/uL — ABNORMAL LOW (ref 4.22–5.81)
RDW: 14 % (ref 11.5–15.5)
WBC: 9 10*3/uL (ref 4.0–10.5)

## 2011-09-16 LAB — PROTIME-INR
INR: 1.17 (ref 0.00–1.49)
Prothrombin Time: 15.1 seconds (ref 11.6–15.2)

## 2011-09-16 MED ORDER — GUAIFENESIN ER 600 MG PO TB12
600.0000 mg | ORAL_TABLET | Freq: Two times a day (BID) | ORAL | Status: DC
  Administered 2011-09-16 – 2011-09-21 (×11): 600 mg via ORAL
  Filled 2011-09-16 (×12): qty 1

## 2011-09-16 MED ORDER — WARFARIN SODIUM 5 MG PO TABS
5.0000 mg | ORAL_TABLET | Freq: Once | ORAL | Status: AC
  Administered 2011-09-16: 5 mg via ORAL
  Filled 2011-09-16: qty 1

## 2011-09-16 MED ORDER — WARFARIN SODIUM 5 MG PO TABS
5.0000 mg | ORAL_TABLET | Freq: Once | ORAL | Status: AC
  Filled 2011-09-16: qty 1

## 2011-09-16 MED ORDER — LEVALBUTEROL HCL 0.63 MG/3ML IN NEBU
0.6300 mg | INHALATION_SOLUTION | Freq: Once | RESPIRATORY_TRACT | Status: AC
  Administered 2011-09-16: 0.63 mg via RESPIRATORY_TRACT
  Filled 2011-09-16: qty 3

## 2011-09-16 MED ORDER — POTASSIUM CHLORIDE CRYS ER 10 MEQ PO TBCR
10.0000 meq | EXTENDED_RELEASE_TABLET | Freq: Once | ORAL | Status: AC
  Administered 2011-09-16: 10 meq via ORAL
  Filled 2011-09-16: qty 1

## 2011-09-16 MED ORDER — DILTIAZEM HCL 60 MG PO TABS
60.0000 mg | ORAL_TABLET | Freq: Three times a day (TID) | ORAL | Status: DC
  Administered 2011-09-16 – 2011-09-17 (×3): 60 mg via ORAL
  Filled 2011-09-16 (×6): qty 1

## 2011-09-16 MED ORDER — FOLIC ACID 1 MG PO TABS
1.0000 mg | ORAL_TABLET | Freq: Every day | ORAL | Status: DC
  Administered 2011-09-16 – 2011-09-21 (×6): 1 mg via ORAL
  Filled 2011-09-16 (×6): qty 1

## 2011-09-16 MED ORDER — POTASSIUM CHLORIDE CRYS ER 20 MEQ PO TBCR
40.0000 meq | EXTENDED_RELEASE_TABLET | Freq: Once | ORAL | Status: AC
  Administered 2011-09-16: 40 meq via ORAL
  Filled 2011-09-16: qty 1

## 2011-09-16 MED ORDER — POLYSACCHARIDE IRON COMPLEX 150 MG PO CAPS
150.0000 mg | ORAL_CAPSULE | Freq: Every day | ORAL | Status: DC
  Administered 2011-09-16 – 2011-09-21 (×6): 150 mg via ORAL
  Filled 2011-09-16 (×6): qty 1

## 2011-09-16 MED ORDER — LEVALBUTEROL HCL 0.63 MG/3ML IN NEBU
0.6300 mg | INHALATION_SOLUTION | Freq: Three times a day (TID) | RESPIRATORY_TRACT | Status: DC | PRN
  Filled 2011-09-16: qty 3

## 2011-09-16 MED ORDER — LACTULOSE 10 GM/15ML PO SOLN
20.0000 g | Freq: Once | ORAL | Status: AC
  Administered 2011-09-16: 20 g via ORAL
  Filled 2011-09-16: qty 30

## 2011-09-16 NOTE — Progress Notes (Addendum)
5 Days Post-Op Procedure(s) (LRB): REDO CORONARY ARTERY BYPASS GRAFTING (CABG) (N/A) TRANSESOPHAGEAL ECHOCARDIOGRAM (TEE) (N/A)  Subjective: Patient with complaints of productive cough (clear to yellowish) and constipation.  Objective: Vital signs in last 24 hours: Patient Vitals for the past 24 hrs:  BP Temp Temp src Pulse Resp SpO2 Weight  09/16/11 0842 127/66 mmHg - - 114  - - -  09/16/11 0430 135/71 mmHg 99 F (37.2 C) Oral 103  18  96 % 280 lb 6.4 oz (127.189 kg)  09/16/11 0109 122/69 mmHg - - 108  - - -  09/15/11 2230 103/62 mmHg - - 111  - - -  09/15/11 2141 98/43 mmHg - - 119  - - -  09/15/11 2104 119/61 mmHg - - 121  - - -  09/15/11 2022 120/73 mmHg 98.7 F (37.1 C) Oral 124  19  100 % -  09/15/11 1900 120/79 mmHg - - - - - -  09/15/11 1559 135/69 mmHg 97.7 F (36.5 C) Oral 78  17  98 % -  09/15/11 1500 117/66 mmHg - - - 19  - -  09/15/11 1400 139/75 mmHg - - - 21  - -  09/15/11 1300 140/86 mmHg - - 113  18  92 % -  09/15/11 1200 123/90 mmHg - - 64  17  100 % -  09/15/11 1135 - 98.1 F (36.7 C) Oral - - - -  09/15/11 1100 - - - 102  17  98 % -  09/15/11 1000 104/71 mmHg - - 114  19  99 % -   Pre op weight  123 kg Current Weight  09/16/11 280 lb 6.4 oz (127.189 kg)      Intake/Output from previous day: 08/23 0701 - 08/24 0700 In: 1621.8 [P.O.:480; I.V.:1041.8; IV Piggyback:100] Out: 2625 [Urine:2625]   Physical Exam:  Cardiovascular: [name]: Wheezes and diminished at bases bilaterally; no rales or rhonchi. Abdomen: Soft, non tender, some distention,bowel sounds present. Extremities: Mild bilateral lower extremity edema. Wounds: Clean and dry.  No erythema or signs of infection. Ecchymosis left high.  Lab Results: CBC: Basename 09/16/11 0525 09/15/11 0428  WBC 9.0 10.2  HGB 9.2* 9.4*  HCT 27.5* 27.9*  PLT 154 125*   BMET:  Basename 09/16/11 0525 09/15/11 0428  NA 139 137  K 3.5 3.8  CL 99 99  CO2 32 32  GLUCOSE 116* 132*  BUN 13  12  CREATININE 0.79 0.66  CALCIUM 9.1 8.9    PT/INR:  Lab Results  Component Value Date   INR 1.17 09/16/2011   INR 1.33 09/11/2011   INR 1.00 09/08/2011   ABG:  INR: Will add last result for INR, ABG once components are confirmed Will add last 4 CBG results once components are confirmed  Assessment/Plan:  1. CV - Remains in afib with heart rate into low 100's this am.On Cardizem gttp. Consider starting po soon.Continue Amiodarone 400 bid, Lopressor 25 bid, and Coumadin. 2.  Pulmonary - Encourage incentive spirometer.Wean O2 (on 3 L vai [place]) 3. Volume Overload - Continue with diuresis. 4.  Acute blood loss anemia - H and H stable at 9.2 and 27.5 Start Nu Iron and folic acid. 5.Supplement potassium. 6.Thrombocytopenia resolved-platelets up to 154,000. 7.HGA1C pre op 6.1.CBGs 108/116/118.Continue Insulin PRN. Will need follow up as an outpatient. 8.Mucinex bid for cough 9.LOC for constipation 10.Xopenex for wheezing  ZIMMERMAN,DONIELLE MPA-C 09/16/2011  change to po Caredizem Continue coumadin Home when PT regulated and a fib  rate controlled I have seen and examined [name] and agree with the above assessment  and plan.  Delight Ovens MD Beeper [phone] Office [phone] 09/16/2011 11:46 AM

## 2011-09-16 NOTE — Progress Notes (Signed)
Subjective: A. Fib continues on IV cardizem drip rate at times up into the 130's.   Objective: Vital signs in last 24 hours: Temp:  [97.7 F (36.5 C)-99 F (37.2 C)] 99 F (37.2 C) (08/24 0430) Pulse Rate:  [64-124] 114  (08/24 0842) Resp:  [17-21] 18  (08/24 0430) BP: (98-140)/(43-90) 127/66 mmHg (08/24 0842) SpO2:  [92 %-100 %] 96 % (08/24 0430) Weight:  [127.189 kg (280 lb 6.4 oz)] 127.189 kg (280 lb 6.4 oz) (08/24 0430) Weight change: -2.311 kg (-5 lb 1.5 oz) Last BM Date: 09/11/11 Intake/Output from previous day: -1483 wt still up post op but decreasing.  127.18  08/23 0701 - 08/24 0700 In: 1621.8 [P.O.:480; I.V.:1041.8; IV Piggyback:100] Out: 2625 [Urine:2625] Intake/Output this shift:    PE: General Skin:clear HEENT:NCAT Neck:2+ carotids Heart:irreg Lungs:clear Abd: Ext:1-2+ pitting edema Neuro:   Lab Results:  Basename 09/16/11 0525 09/15/11 0428  WBC 9.0 10.2  HGB 9.2* 9.4*  HCT 27.5* 27.9*  PLT 154 125*   BMET  Basename 09/16/11 0525 09/15/11 0428  NA 139 137  K 3.5 3.8  CL 99 99  CO2 32 32  GLUCOSE 116* 132*  BUN 13 12  CREATININE 0.79 0.66  CALCIUM 9.1 8.9    EKG: Orders placed during the hospital encounter of 09/11/11  . EKG 12-LEAD  . EKG 12-LEAD  . EKG 12-LEAD  . EKG 12-LEAD  . EKG 12-LEAD  . EKG 12-LEAD  . EKG 12-LEAD  . EKG 12-LEAD  . EKG 12-LEAD  . EKG 12-LEAD    Studies/Results: Dg Chest Port 1 View  09/15/2011  *RADIOLOGY REPORT*  Clinical Data: Postop.  PORTABLE CHEST - 1 VIEW  Comparison: 09/14/2011  Findings: Right jugular central line remains in place.  Cardiac silhouette remains markedly enlarged.  Coarse interstitial markings have minimally changed. Previous exam raised concern for a developing density in the left upper lung but there is no focal opacity on today's examination.  Trachea is midline.  Overall, there are low lung volumes with basilar densities.  Negative for a pneumothorax.  IMPRESSION: Cardiomegaly with low  lung volumes.  Basilar densities could represent atelectasis and small effusions.   Original Report Authenticated By: Richarda Overlie, M.D.     Medications: I have reviewed the patient's current medications.    Marland Kitchen amiodarone  400 mg Oral BID  . aspirin EC  81 mg Oral Daily  . atorvastatin  10 mg Oral q1800  . diltiazem  10 mg Intravenous Once  . docusate sodium  200 mg Oral Daily  . enoxaparin  40 mg Subcutaneous Q12H  . ezetimibe  10 mg Oral Daily  . furosemide  40 mg Oral Daily  . insulin aspart  0-24 Units Subcutaneous TID AC & HS  . metoprolol tartrate  25 mg Oral BID  . metoprolol tartrate  25 mg Oral Once  . pantoprazole  40 mg Oral Q1200  . potassium chloride  20 mEq Oral Daily  . sodium chloride  3 mL Intravenous Q12H  . warfarin  5 mg Oral ONCE-1800  . Warfarin - Physician Dosing Inpatient   Does not apply q1800   Assessment/Plan: Principal Problem:  *CAD (coronary artery disease), CABG '96, re do CABG 09/11/11 X 3 Active Problems:  Bradycardia, beta blocker decreased 3/13  HTN (hypertension), now hypotensive post op in AF  Dyslipidemia  Sleep apnea, on C-pap   Atrial fibrillation with RVR post op re do CABG, (Sinus bradycardia pre op)  Ischemic cardiomyopathy, "moderate LVD" at  cath 09/01/11  Plan:5 Days Post-Op  Procedure(s) (LRB):  REDO CORONARY ARTERY BYPASS GRAFTING (CABG) (N/A)  TRANSESOPHAGEAL ECHOCARDIOGRAM (TEE) (N/A)   EKG A. Fib with PVCs VS stable IV dilt continues          PO amiodarone at 400 mg BID , Lopressor 25 BID. And Coumadin.     LOS: 5 days   INGOLD,LAURA R 09/16/2011, 9:51 AM  Agree with note written by Nada Boozer RNP  POD # 5 re do CABG X 3. Pt of RAWs. PAF with CVR. Mild volume overload. On amio load and po CCB. Mobilize, CRH, coumadin A/C. Gentle diuresis.   [name] 09/16/2011 10:27 AM

## 2011-09-16 NOTE — Progress Notes (Signed)
34 Talked with pt's RN. She will walk pt later as rate just now has come down. We will followup. Tessah Patchen DunlapRN

## 2011-09-17 LAB — BASIC METABOLIC PANEL
BUN: 15 mg/dL (ref 6–23)
CO2: 31 mEq/L (ref 19–32)
Calcium: 9.2 mg/dL (ref 8.4–10.5)
Chloride: 98 mEq/L (ref 96–112)
Creatinine, Ser: 0.76 mg/dL (ref 0.50–1.35)
GFR calc Af Amer: >90 mL/min (ref >90)
GFR calc non Af Amer: >90 mL/min (ref >90)
Glucose, Bld: 119 mg/dL — ABNORMAL HIGH (ref 70–99)
Potassium: 4.2 mEq/L (ref 3.5–5.1)
Sodium: 137 mEq/L (ref 135–145)

## 2011-09-17 LAB — GLUCOSE, CAPILLARY
Glucose-Capillary: 124 mg/dL — ABNORMAL HIGH (ref 70–99)
Glucose-Capillary: 123 mg/dL — ABNORMAL HIGH (ref 70–99)
Glucose-Capillary: 125 mg/dL — ABNORMAL HIGH (ref 70–99)
Glucose-Capillary: 119 mg/dL — ABNORMAL HIGH (ref 70–99)

## 2011-09-17 LAB — CBC
HCT: 29.5 % — ABNORMAL LOW (ref 39.0–52.0)
Hemoglobin: 9.6 g/dL — ABNORMAL LOW (ref 13.0–17.0)
MCH: 29.2 pg (ref 26.0–34.0)
MCHC: 32.5 g/dL (ref 30.0–36.0)
MCV: 89.7 fL (ref 78.0–100.0)
Platelets: 192 10*3/uL (ref 150–400)
RBC: 3.29 MIL/uL — ABNORMAL LOW (ref 4.22–5.81)
RDW: 14.1 % (ref 11.5–15.5)
WBC: 11 10*3/uL — ABNORMAL HIGH (ref 4.0–10.5)

## 2011-09-17 LAB — PROTIME-INR
INR: 1.13 (ref 0.00–1.49)
Prothrombin Time: 14.7 seconds (ref 11.6–15.2)

## 2011-09-17 MED ORDER — DILTIAZEM HCL 90 MG PO TABS
90.0000 mg | ORAL_TABLET | Freq: Three times a day (TID) | ORAL | Status: DC
  Administered 2011-09-17 – 2011-09-21 (×12): 90 mg via ORAL
  Filled 2011-09-17 (×15): qty 1

## 2011-09-17 MED ORDER — COUMADIN BOOK
Freq: Once | Status: AC
  Administered 2011-09-17: 17:00:00
  Filled 2011-09-17: qty 1

## 2011-09-17 MED ORDER — WARFARIN SODIUM 7.5 MG PO TABS
7.5000 mg | ORAL_TABLET | Freq: Once | ORAL | Status: AC
  Administered 2011-09-17: 7.5 mg via ORAL
  Filled 2011-09-17: qty 1

## 2011-09-17 MED ORDER — WARFARIN VIDEO
Freq: Once | Status: AC
  Administered 2011-09-17: 17:00:00

## 2011-09-17 NOTE — Progress Notes (Signed)
09/17/2011 2:29 PM Nursing note Pt. And family viewed video #109 on coumadin. Questions and concerns addressed.  Hoyte Ziebell, Blanchard Kelch

## 2011-09-17 NOTE — Progress Notes (Addendum)
6 Days Post-Op Procedure(s) (LRB): REDO CORONARY ARTERY BYPASS GRAFTING (CABG) (N/A) TRANSESOPHAGEAL ECHOCARDIOGRAM (TEE) (N/A)  Subjective: Patient slowly feeling better.  Objective: Vital signs in last 24 hours: Patient Vitals for the past 24 hrs:  BP Temp Temp src Pulse Resp SpO2 Weight  09/17/11 0431 103/64 mmHg 99.4 F (37.4 C) Oral 84  20  96 % 222 lb 8 oz (100.925 kg)  09/16/11 1922 138/73 mmHg 98.8 F (37.1 C) Oral 128  18  96 % -  09/16/11 1453 108/75 mmHg 98.6 F (37 C) Oral 99  18  98 % -  09/16/11 1333 106/55 mmHg - - - - - -  09/16/11 1132 - - - - - 95 % -  09/16/11 0842 127/66 mmHg - - 114  - - -   Pre op weight  123 kg Current Weight  09/17/11 222 lb 8 oz (100.925 kg)      Intake/Output from previous day: 08/24 0701 - 08/25 0700 In: 120 [P.O.:120] Out: 653 [Urine:652; Stool:1]   Physical Exam:  Cardiovascular: [name]: Wheezes and diminished at bases bilaterally; no rales or rhonchi. Abdomen: Soft, non tender, some distention,bowel sounds present. Extremities: Mild bilateral lower extremity edema. Wounds: Clean and dry.  No erythema or signs of infection. Ecchymosis left high.  Lab Results: CBC:  Basename 09/17/11 0524 09/16/11 0525  WBC 11.0* 9.0  HGB 9.6* 9.2*  HCT 29.5* 27.5*  PLT 192 154   BMET:   Basename 09/17/11 0524 09/16/11 0525  NA 137 139  K 4.2 3.5  CL 98 99  CO2 31 32  GLUCOSE 119* 116*  BUN 15 13  CREATININE 0.76 0.79  CALCIUM 9.2 9.1    PT/INR:  Lab Results  Component Value Date   INR 1.13 09/17/2011   INR 1.17 09/16/2011   INR 1.33 09/11/2011   ABG:  INR: Will add last result for INR, ABG once components are confirmed Will add last 4 CBG results once components are confirmed  Assessment/Plan:  1. CV - Remains in afib with heart rate into low 80's-90's this am. Continue Amiodarone 400 bid, Lopressor 25 bid, Cardizem 60 tid,and will give Coumadin 7.5 once tonight as INR remains low. 2.  Pulmonary -  Encourage incentive spirometer.Wean O2 (on 3 L vai ) 3. Volume Overload - Continue with diuresis. 4.  Acute blood loss anemia - H and H stable at 9.6 and 29.5 Continue Nu Iron and folic acid. 5.Remove EPW in am 6.Thrombocytopenia resolved-platelets up to 154,000. 7.HGA1C pre op 6.1.CBGs 115/125/124 .Continue Insulin PRN. Will need follow up as an outpatient. 8.Continue with CRPI  ZIMMERMAN,DONIELLE MPA-C 09/17/2011 8:09 AM  Lab Results  Component Value Date   INR 1.13 09/17/2011   INR 1.17 09/16/2011   INR 1.33 09/11/2011   D/c wires in am Continue coumadin Home when rate regulated and inr regulated  I have seen and examined Nicholas Lynn and agree with the above assessment  and plan.  Delight Ovens MD Beeper [phone] Office [phone] 09/17/2011 12:03 PM

## 2011-09-18 LAB — BASIC METABOLIC PANEL
BUN: 15 mg/dL (ref 6–23)
CO2: 29 mEq/L (ref 19–32)
Calcium: 9.2 mg/dL (ref 8.4–10.5)
Chloride: 97 mEq/L (ref 96–112)
Creatinine, Ser: 0.76 mg/dL (ref 0.50–1.35)
GFR calc Af Amer: >90 mL/min (ref >90)
GFR calc non Af Amer: >90 mL/min (ref >90)
Glucose, Bld: 107 mg/dL — ABNORMAL HIGH (ref 70–99)
Potassium: 3.7 mEq/L (ref 3.5–5.1)
Sodium: 136 mEq/L (ref 135–145)

## 2011-09-18 LAB — CBC
HCT: 29.8 % — ABNORMAL LOW (ref 39.0–52.0)
Hemoglobin: 9.8 g/dL — ABNORMAL LOW (ref 13.0–17.0)
MCH: 29.4 pg (ref 26.0–34.0)
MCHC: 32.9 g/dL (ref 30.0–36.0)
MCV: 89.5 fL (ref 78.0–100.0)
Platelets: 202 10*3/uL (ref 150–400)
RBC: 3.33 MIL/uL — ABNORMAL LOW (ref 4.22–5.81)
RDW: 14.1 % (ref 11.5–15.5)
WBC: 10.5 10*3/uL (ref 4.0–10.5)

## 2011-09-18 LAB — GLUCOSE, CAPILLARY
Glucose-Capillary: 115 mg/dL — ABNORMAL HIGH (ref 70–99)
Glucose-Capillary: 121 mg/dL — ABNORMAL HIGH (ref 70–99)
Glucose-Capillary: 109 mg/dL — ABNORMAL HIGH (ref 70–99)
Glucose-Capillary: 123 mg/dL — ABNORMAL HIGH (ref 70–99)

## 2011-09-18 LAB — PROTIME-INR
INR: 1.21 (ref 0.00–1.49)
Prothrombin Time: 15.6 seconds — ABNORMAL HIGH (ref 11.6–15.2)

## 2011-09-18 MED ORDER — WARFARIN SODIUM 7.5 MG PO TABS
7.5000 mg | ORAL_TABLET | Freq: Every day | ORAL | Status: DC
  Administered 2011-09-18 – 2011-09-20 (×3): 7.5 mg via ORAL
  Filled 2011-09-18 (×4): qty 1

## 2011-09-18 MED ORDER — OFF THE BEAT BOOK
Freq: Once | Status: AC
  Administered 2011-09-18: 16:00:00
  Filled 2011-09-18: qty 1

## 2011-09-18 NOTE — Plan of Care (Signed)
Problem: Phase III Progression Outcomes Goal: Other Phase III Outcomes/Goals Outcome: Completed/Met Date Met:  09/18/11 Education given on A Fib

## 2011-09-18 NOTE — Progress Notes (Signed)
[facility]  DAILY PROGRESS NOTE   Subjective:  No complaints. Remains in rate-controlled a-fib. Mild LE edema.  Objective:  Temp:  [97.6 F (36.4 C)-98.9 F (37.2 C)] 98.9 F (37.2 C) (08/26 0423) Pulse Rate:  [99-114] 114  (08/26 1113) Resp:  [18-20] 18  (08/26 0423) BP: (100-141)/(57-83) 130/63 mmHg (08/26 1113) SpO2:  [95 %] 95 % (08/26 0423) Weight:  [91.808 kg (202 lb 6.4 oz)] 91.808 kg (202 lb 6.4 oz) (08/26 0423) Weight change: -9.117 kg (-20 lb 1.6 oz)  Intake/Output from previous day: 08/25 0701 - 08/26 0700 In: 480 [P.O.:480] Out: 3 [Urine:2; Stool:1]  Intake/Output from this shift: Total I/O In: 3 [I.V.:3] Out: 275 [Urine:275]  Medications: Current Facility-Administered Medications  Medication Dose Route Frequency Provider Last Rate Last Dose  . 0.9 %  sodium chloride infusion  250 mL Intravenous PRN Delight Ovens, MD      . amiodarone (PACERONE) tablet 400 mg  400 mg Oral BID Delight Ovens, MD   400 mg at 09/18/11 1013  . aspirin EC tablet 81 mg  81 mg Oral Daily Delight Ovens, MD   81 mg at 09/18/11 1013  . atorvastatin (LIPITOR) tablet 10 mg  10 mg Oral q1800 Ardelle Balls, PA   10 mg at 09/17/11 1710  . bisacodyl (DULCOLAX) EC tablet 10 mg  10 mg Oral Daily PRN Delight Ovens, MD       Or  . bisacodyl (DULCOLAX) suppository 10 mg  10 mg Rectal Daily PRN Delight Ovens, MD      . coumadin book   Does not apply Once Anh P Pham, PHARMD      . diltiazem (CARDIZEM) tablet 90 mg  90 mg Oral Q8H Delight Ovens, MD   90 mg at 09/18/11 1610  . docusate sodium (COLACE) capsule 200 mg  200 mg Oral Daily Delight Ovens, MD   200 mg at 09/18/11 1013  . enoxaparin (LOVENOX) injection 40 mg  40 mg Subcutaneous Q12H Delight Ovens, MD   40 mg at 09/18/11 1013  . ezetimibe (ZETIA) tablet 10 mg  10 mg Oral Daily Ardelle Balls, PA   10 mg at 09/18/11 1013  . folic acid (FOLVITE) tablet 1 mg  1 mg Oral  Daily Ardelle Balls, PA   1 mg at 09/18/11 1013  . guaiFENesin (MUCINEX) 12 hr tablet 600 mg  600 mg Oral BID Ardelle Balls, PA   600 mg at 09/18/11 1013  . insulin aspart (novoLOG) injection 0-24 Units  0-24 Units Subcutaneous TID AC & HS Delight Ovens, MD   2 Units at 09/17/11 [phone]  . iron polysaccharides (NIFEREX) capsule 150 mg  150 mg Oral Daily Ardelle Balls, PA   150 mg at 09/18/11 1013  . levalbuterol (XOPENEX) nebulizer solution 0.63 mg  0.63 mg Nebulization Q8H PRN Ardelle Balls, PA      . magnesium hydroxide (MILK OF MAGNESIA) suspension 30 mL  30 mL Oral Daily PRN Delight Ovens, MD      . metoprolol tartrate (LOPRESSOR) tablet 25 mg  25 mg Oral BID Delight Ovens, MD   25 mg at 09/18/11 1013  . ondansetron (ZOFRAN) tablet 4 mg  4 mg Oral Q6H PRN Delight Ovens, MD       Or  . ondansetron [facility]) injection 4 mg  4 mg Intravenous Q6H PRN Delight Ovens, MD      .  oxyCODONE (Oxy IR/ROXICODONE) immediate release tablet 5-10 mg  5-10 mg Oral Q3H PRN Delight Ovens, MD   10 mg at 09/18/11 1308  . pantoprazole (PROTONIX) EC tablet 40 mg  40 mg Oral Q1200 Delight Ovens, MD   40 mg at 09/18/11 1143  . sodium chloride 0.9 % injection 3 mL  3 mL Intravenous Q12H Delight Ovens, MD   3 mL at 09/18/11 1000  . sodium chloride 0.9 % injection 3 mL  3 mL Intravenous PRN Delight Ovens, MD      . traMADol Janean Sark) tablet 50-100 mg  50-100 mg Oral Q4H PRN Delight Ovens, MD      . warfarin (COUMADIN) tablet 7.5 mg  7.5 mg Oral ONCE-1800 Ardelle Balls, PA   7.5 mg at 09/17/11 1711  . warfarin (COUMADIN) video   Does not apply Once Anh Helayne [place], PHARMD      . Warfarin - Physician Dosing Inpatient   Does not apply q1800 Lavonia Dana, [facility]        Physical Exam: General appearance: alert and no distress Neck: no adenopathy, no carotid bruit, no JVD, supple, symmetrical, trachea midline and thyroid not enlarged, symmetric, no  tenderness/mass/nodules Lungs: clear to auscultation bilaterally Heart: irregularly irregular rhythm Abdomen: soft, non-tender; bowel sounds normal; no masses,  no organomegaly Extremities: edema 1+ Pulses: 2+ and symmetric  Lab Results: Results for orders placed during the hospital encounter of 09/11/11 (from the past 48 hour(s))  GLUCOSE, CAPILLARY     Status: Abnormal   Collection Time   09/16/11  4:18 PM      Component Value Range Comment   Glucose-Capillary 115 (*) 70 - 99 mg/dL    Comment 1 Notify RN     GLUCOSE, CAPILLARY     Status: Abnormal   Collection Time   09/16/11  8:51 PM      Component Value Range Comment   Glucose-Capillary 125 (*) 70 - 99 mg/dL   PROTIME-INR     Status: Normal   Collection Time   09/17/11  5:24 AM      Component Value Range Comment   Prothrombin Time 14.7  11.6 - 15.2 seconds    INR 1.13  0.00 - 1.49   BASIC METABOLIC PANEL     Status: Abnormal   Collection Time   09/17/11  5:24 AM      Component Value Range Comment   Sodium 137  135 - 145 mEq/L    Potassium 4.2  3.5 - 5.1 mEq/L    Chloride 98  96 - 112 mEq/L    CO2 31  19 - 32 mEq/L    Glucose, Bld 119 (*) 70 - 99 mg/dL    BUN 15  6 - 23 mg/dL    Creatinine, Ser 6.57  0.50 - 1.35 mg/dL    Calcium 9.2  8.4 - 84.6 mg/dL    GFR calc non Af Amer >90  >90 mL/min    GFR calc Af Amer >90  >90 mL/min   CBC     Status: Abnormal   Collection Time   09/17/11  5:24 AM      Component Value Range Comment   WBC 11.0 (*) 4.0 - 10.5 K/uL    RBC 3.29 (*) 4.22 - 5.81 MIL/uL    Hemoglobin 9.6 (*) 13.0 - 17.0 g/dL    HCT 96.2 (*) 95.2 - 52.0 %    MCV 89.7  78.0 - 100.0 fL  MCH 29.2  26.0 - 34.0 pg    MCHC 32.5  30.0 - 36.0 g/dL    RDW 14.7  82.9 - 56.2 %    Platelets 192  150 - 400 K/uL   GLUCOSE, CAPILLARY     Status: Abnormal   Collection Time   09/17/11  5:59 AM      Component Value Range Comment   Glucose-Capillary 124 (*) 70 - 99 mg/dL   GLUCOSE, CAPILLARY     Status: Abnormal   Collection  Time   09/17/11 11:02 AM      Component Value Range Comment   Glucose-Capillary 123 (*) 70 - 99 mg/dL    Comment 1 Notify RN     GLUCOSE, CAPILLARY     Status: Abnormal   Collection Time   09/17/11  4:04 PM      Component Value Range Comment   Glucose-Capillary 125 (*) 70 - 99 mg/dL   GLUCOSE, CAPILLARY     Status: Abnormal   Collection Time   09/17/11  9:17 PM      Component Value Range Comment   Glucose-Capillary 119 (*) 70 - 99 mg/dL   PROTIME-INR     Status: Abnormal   Collection Time   09/18/11  5:00 AM      Component Value Range Comment   Prothrombin Time 15.6 (*) 11.6 - 15.2 seconds    INR 1.21  0.00 - 1.49   BASIC METABOLIC PANEL     Status: Abnormal   Collection Time   09/18/11  5:00 AM      Component Value Range Comment   Sodium 136  135 - 145 mEq/L    Potassium 3.7  3.5 - 5.1 mEq/L    Chloride 97  96 - 112 mEq/L    CO2 29  19 - 32 mEq/L    Glucose, Bld 107 (*) 70 - 99 mg/dL    BUN 15  6 - 23 mg/dL    Creatinine, Ser 1.30  0.50 - 1.35 mg/dL    Calcium 9.2  8.4 - 86.5 mg/dL    GFR calc non Af Amer >90  >90 mL/min    GFR calc Af Amer >90  >90 mL/min   CBC     Status: Abnormal   Collection Time   09/18/11  5:00 AM      Component Value Range Comment   WBC 10.5  4.0 - 10.5 K/uL    RBC 3.33 (*) 4.22 - 5.81 MIL/uL    Hemoglobin 9.8 (*) 13.0 - 17.0 g/dL    HCT 78.4 (*) 69.6 - 52.0 %    MCV 89.5  78.0 - 100.0 fL    MCH 29.4  26.0 - 34.0 pg    MCHC 32.9  30.0 - 36.0 g/dL    RDW 29.5  28.4 - 13.2 %    Platelets 202  150 - 400 K/uL   GLUCOSE, CAPILLARY     Status: Abnormal   Collection Time   09/18/11  6:04 AM      Component Value Range Comment   Glucose-Capillary 115 (*) 70 - 99 mg/dL   GLUCOSE, CAPILLARY     Status: Abnormal   Collection Time   09/18/11 11:19 AM      Component Value Range Comment   Glucose-Capillary 121 (*) 70 - 99 mg/dL    Comment 1 Notify RN      Comment 2 Documented in Chart       Imaging: No results found.  Assessment:  1. Principal  Problem: 2.  *CAD (coronary artery disease), CABG '96, re do CABG 09/11/11 X 3 3. Active Problems: 4.  Bradycardia, beta blocker decreased 3/13 5.  HTN (hypertension), now hypotensive post op in AF 6.  Dyslipidemia 7.  Sleep apnea, on C-pap  8.  Atrial fibrillation with RVR post op re do CABG, (Sinus bradycardia pre op) 9.  Ischemic cardiomyopathy, "moderate LVD" at cath 09/01/11 10.   Plan:  1. Agree with restarting lasix for LE edema. INR remains subtherapeutic. Again recommend outpatient cardioversion after 1 month of consecutive weekly INR's >2.0.     Time Spent Directly with Patient:  15 minutes  Length of Stay:  LOS: 7 days   Chrystie Nose, MD, [facility] Attending Cardiologist [facility]  Tyronda Vizcarrondo C 09/18/2011, 12:40 PM

## 2011-09-18 NOTE — Progress Notes (Signed)
Pacing wires d/c per MD order. Pt tolerated well. Vital signs stable. All wires intact upon removal. Bedrest for one hour. Call bell within reach. Dion Saucier

## 2011-09-18 NOTE — Progress Notes (Signed)
Pt ambulated 237ft with RW x 1 assist, steady gait. O2 sats upon return to room 93% on ra.  Will continue to monitor.

## 2011-09-18 NOTE — Progress Notes (Addendum)
7 Days Post-Op Procedure(s) (LRB): REDO CORONARY ARTERY BYPASS GRAFTING (CABG) (N/A) TRANSESOPHAGEAL ECHOCARDIOGRAM (TEE) (N/A)  Subjective:  Mr. Gossman has no complaints this morning.  He remains in rate controlled atrial fibrillation.  He is ambulating, + BM  Objective: Vital signs in last 24 hours: Temp:  [97.6 F (36.4 C)-98.9 F (37.2 C)] 98.9 F (37.2 C) (08/26 0423) Pulse Rate:  [99-114] 99  (08/26 0423) Cardiac Rhythm:  [-] Atrial fibrillation (08/25 1950) Resp:  [18-20] 18  (08/26 0423) BP: (100-135)/(61-74) 126/61 mmHg (08/26 0423) SpO2:  [95 %] 95 % (08/26 0423) Weight:  [202 lb 6.4 oz (91.808 kg)] 202 lb 6.4 oz (91.808 kg) (08/26 0423)   Intake/Output from previous day: 08/25 0701 - 08/26 0700 In: 480 [P.O.:480] Out: 3 [Urine:2; Stool:1]  General appearance: alert, cooperative and no distress Heart: irregularly irregular rhythm Lungs: clear to auscultation bilaterally Abdomen: soft, non-tender; bowel sounds normal; no masses,  no organomegaly Extremities: edema 2+ pitting Wound: clean and dry  Lab Results:  Basename 09/18/11 0500 09/17/11 0524  WBC 10.5 11.0*  HGB 9.8* 9.6*  HCT 29.8* 29.5*  PLT 202 192   BMET:  Basename 09/17/11 0524 09/16/11 0525  NA 137 139  K 4.2 3.5  CL 98 99  CO2 31 32  GLUCOSE 119* 116*  BUN 15 13  CREATININE 0.76 0.79  CALCIUM 9.2 9.1    PT/INR:  Basename 09/18/11 0500  LABPROT 15.6*  INR 1.21   ABG    Component Value Date/Time   PHART 7.406 09/11/2011 2115   HCO3 26.1* 09/11/2011 2115   TCO2 23 09/12/2011 1644   ACIDBASEDEF 2.0 09/11/2011 1642   O2SAT 89.0 09/11/2011 2115   CBG (last 3)   Basename 09/18/11 0604 09/17/11 2117 09/17/11 1604  GLUCAP 115* 119* 125*    Assessment/Plan: S/P Procedure(s) (LRB): REDO CORONARY ARTERY BYPASS GRAFTING (CABG) (N/A) TRANSESOPHAGEAL ECHOCARDIOGRAM (TEE) (N/A)  1. CV- Atrial Fibrillation rate controlled, on Amiodarone, Lopressor and Coumadin 2. Pulm- wean oxygen as  tolerated, continue IS 3. Volume Overload- 2+ pitting LE edema, will restart Lasix 40mg  daily 4. Acute post operative anemia- stable 5. DM- CBGs controlled 6. Dispo- d/c EPW, patient progressing well, continue coumadin INR not therapeutic, continue diuresis, hopefully can d/c soon    LOS: 7 days    BARRETT, ERIN 09/18/2011   patient examined and medical record reviewed,agree with above note. Needs to be weaned of O2 before DC home CXR in AM VAN TRIGT III,Carlis Burnsworth 09/18/2011

## 2011-09-18 NOTE — Progress Notes (Signed)
CARDIAC REHAB PHASE I   PRE:  Rate/Rhythm: 109afib  BP:  Supine:   Sitting: 121/55  Standing:    SaO2: 97%2L  MODE:  Ambulation: 350 ft   POST:  Rate/Rhythem: 134afib  BP:  Supine:   Sitting: 135/79  Standing:    SaO2: 93%2L 0902-0930 Pt walked 350 ft on 2L with rolling walker and asst x 1. HR to 134. Stopped once to rest. Tolerated well. Back to sitting on side of bed after walk. HR to 120. Encouraged walks with staff.  Nicholas Lynn

## 2011-09-19 ENCOUNTER — Inpatient Hospital Stay (HOSPITAL_COMMUNITY): Payer: Medicare Other

## 2011-09-19 DIAGNOSIS — Z7901 Long term (current) use of anticoagulants: Secondary | ICD-10-CM

## 2011-09-19 LAB — GLUCOSE, CAPILLARY
Glucose-Capillary: 100 mg/dL — ABNORMAL HIGH (ref 70–99)
Glucose-Capillary: 99 mg/dL (ref 70–99)
Glucose-Capillary: 96 mg/dL (ref 70–99)
Glucose-Capillary: 102 mg/dL — ABNORMAL HIGH (ref 70–99)

## 2011-09-19 LAB — PROTIME-INR
INR: 1.23 (ref 0.00–1.49)
Prothrombin Time: 15.8 seconds — ABNORMAL HIGH (ref 11.6–15.2)

## 2011-09-19 MED ORDER — EZETIMIBE 10 MG PO TABS: 10.0000 mg | ORAL_TABLET | Freq: Every day | ORAL | Status: DC

## 2011-09-19 MED ORDER — FOLIC ACID 1 MG PO TABS: 1.0000 mg | ORAL_TABLET | Freq: Every day | ORAL | Status: DC

## 2011-09-19 MED ORDER — OXYCODONE HCL 5 MG PO TABS: 5.0000 mg | ORAL_TABLET | ORAL | Status: AC | PRN

## 2011-09-19 MED ORDER — POLYSACCHARIDE IRON COMPLEX 150 MG PO CAPS: 150.0000 mg | ORAL_CAPSULE | Freq: Every day | ORAL | Status: DC

## 2011-09-19 MED ORDER — ATORVASTATIN CALCIUM 10 MG PO TABS: 10.0000 mg | ORAL_TABLET | Freq: Every day | ORAL | Status: DC

## 2011-09-19 MED ORDER — DILTIAZEM HCL 90 MG PO TABS: 90.0000 mg | ORAL_TABLET | Freq: Three times a day (TID) | ORAL | Status: DC

## 2011-09-19 MED ORDER — WARFARIN SODIUM 7.5 MG PO TABS: ORAL_TABLET | ORAL | Status: DC

## 2011-09-19 MED ORDER — AMIODARONE HCL 400 MG PO TABS: ORAL_TABLET | ORAL | Status: DC

## 2011-09-19 MED ORDER — METOPROLOL TARTRATE 25 MG PO TABS: 25.0000 mg | ORAL_TABLET | Freq: Two times a day (BID) | ORAL | Status: DC

## 2011-09-19 MED ORDER — ASPIRIN 81 MG PO TBEC: 81.0000 mg | DELAYED_RELEASE_TABLET | Freq: Every day | ORAL | Status: DC

## 2011-09-19 NOTE — Progress Notes (Addendum)
8 Days Post-Op Procedure(s) (LRB): REDO CORONARY ARTERY BYPASS GRAFTING (CABG) (N/A) TRANSESOPHAGEAL ECHOCARDIOGRAM (TEE) (N/A) Subjective:  Nicholas Lynn has no complaints this morning.  He was weaned off his oxygen yesterday.  He remains in Atrial Fibrillation. + BM  Objective: Vital signs in last 24 hours: Temp:  [98.3 F (36.8 C)-99 F (37.2 C)] 98.3 F (36.8 C) (08/27 0428) Pulse Rate:  [93-122] 93  (08/27 0428) Cardiac Rhythm:  [-] Atrial fibrillation (08/26 2025) Resp:  [18-20] 18  (08/27 0428) BP: (119-141)/(57-83) 119/70 mmHg (08/27 0428) SpO2:  [91 %-95 %] 91 % (08/27 0428) Weight:  [285 lb 1.6 oz (129.321 kg)] 285 lb 1.6 oz (129.321 kg) (08/27 0428)   Intake/Output from previous day: 08/26 0701 - 08/27 0700 In: 723 [P.O.:720; I.V.:3] Out: 1075 [Urine:1075]  General appearance: alert, cooperative and no distress Heart: irregularly irregular rhythm Lungs: diminished breath sounds bibasilar Abdomen: soft, non-tender; bowel sounds normal; no masses,  no organomegaly Extremities: edema 1-2+ Wound: clean and dry  Lab Results:  Basename 09/18/11 0500 09/17/11 0524  WBC 10.5 11.0*  HGB 9.8* 9.6*  HCT 29.8* 29.5*  PLT 202 192   BMET:  Basename 09/18/11 0500 09/17/11 0524  NA 136 137  K 3.7 4.2  CL 97 98  CO2 29 31  GLUCOSE 107* 119*  BUN 15 15  CREATININE 0.76 0.76  CALCIUM 9.2 9.2    PT/INR:  Basename 09/19/11 0455  LABPROT 15.8*  INR 1.23   ABG    Component Value Date/Time   PHART 7.406 09/11/2011 2115   HCO3 26.1* 09/11/2011 2115   TCO2 23 09/12/2011 1644   ACIDBASEDEF 2.0 09/11/2011 1642   O2SAT 89.0 09/11/2011 2115   CBG (last 3)   Basename 09/19/11 0601 09/18/11 2124 09/18/11 1627  GLUCAP 100* 123* 109*    Assessment/Plan: S/P Procedure(s) (LRB): REDO CORONARY ARTERY BYPASS GRAFTING (CABG) (N/A) TRANSESOPHAGEAL ECHOCARDIOGRAM (TEE) (N/A)  1. CV- Atrial Fibrillation- will continue Lopressor and Amiodarone 2. INR 1.23- slow response to  coumadin has received 2 doses of 5mg  and 2 doses of 7.5mg , will repeat INR in morning, if no response will increase coumadin dose 3. Pulm- + pleural effusion on left, bibasilar atelectasis, continue IS 4. DM- CBGs controlled 5. Dispo- patient doing well.  Slow to respond to coumadin, if INR trends up tomorrow can d/c in AM   LOS: 8 days    BARRETT, ERIN 09/19/2011   Patient seen and examined. Agree with above

## 2011-09-19 NOTE — Progress Notes (Signed)
CARDIAC REHAB PHASE I   PRE:  Rate/Rhythm: 102afib  BP:  Supine:   Sitting:   Standing: 130/80   SaO2: 94%RA  MODE:  Ambulation: 550 ft   POST:  Rate/Rhythem: 127-131 afib   115 at rest  BP:  Supine:   Sitting: 150/72  Standing:    SaO2: 94%RA  0850-0913 Pt walked 550 ft on RA with rolling walker and minimal asst. Stopped twice to take standing rest break. Encouraged pursed-lip breathing. Sats good on RA. HR elevated with walk but down with rest. To recliner after walk with call bell. Nicholas Lynn

## 2011-09-19 NOTE — Discharge Summary (Signed)
[address]            [place] [address]          [phone]         Discharge Summary  Name: Nicholas Lynn DOB: 02/13/1945 66 y.o. MRN: [id number]   Admission Date: 09/11/2011 Discharge Date:     Admitting Diagnosis:  Recurrent coronary artery disease   Discharge Diagnosis:   Recurrent coronary artery disease  CAD, status post CABG on 02/22/94  Postoperative atrial fibrillation  Expected postoperative blood loss anemia  History of bradycardia  Hypertension  Hyperlipidemia  GERD  Recent umbilical hernia repair  Myocardial infarction in 1988, status post angioplasty  History of tobacco use  Sleep apnea, on CPAP  Ischemic cardiomyopathy    Procedures:  REDO CORONARY ARTERY BYPASS GRAFTING x 3 (Free right internal mammary artery to posterior descending, saphenous vein graft to second obtuse marginal, saphenous vein graft to ramus intermedius), Endoscopic vein harvest bilateral thighs on 09/11/2011     HPI:  The patient is a 66 y.o. male with a known history of coronary artery disease.  He is status post previous MI with angioplasty in 1988, and underwent CABG x 4 on 02/22/94.  He was in his usual state of health until July 2013.  He developed bradycardia, requiring his beta blocker dosage to be decreased. He subsequently began to note exertional angina symptoms relieved with rest.  He underwent a Myoview study on 08/30/2011 which showed ischemia in the left circumflex distribution, which was worrisome for jeopardized left-to-right collaterals.  Cardiac catheterization revealed severe native vessel disease, with known occluded vein graft to the RCA, as well as severe diffuse disease in the vein graft to the second and third obtuse marginals.  He was referred to Dr. Tyrone Sage for consideration of redo CABG. All risks, benefits and alternatives of surgery were explained in detail, and the patient agreed to  proceed.    Hospital Course:  The patient was admitted to [facility] on 09/11/2011. The patient was taken to the operating room and underwent the above procedure.    The postoperative course has been notable for atrial fibrillation, which has required multiple agents. Despite aggressive medical therapy, he has not converted to sinus rhythm, although he has remained rate controlled.  Cardiology has recommended outpatient cardioversion if he remains in atrial fib after 1 month of medical therapy and anticoagulation.  The patient did develop cellulitis of his LLE Endovein harvest site and is being treated with Keflex.  He has otherwise done well.  He has been volume overloaded, and has been started on Lasix, to which he is responding well. He is ambulating with cardiac rehab and is tolerating a regular diet.   We anticipate discharge home in the next 24-48 hours provided his INR continues to trend up and he is otherwise stable.     Recent vital signs:  Filed Vitals:   09/21/11 0547  BP: 126/66  Pulse: 70  Temp: 98.2 F (36.8 C)  Resp: 18    Recent laboratory studies:  CBC:No results found for this basename: WBC:2,HGB:2,HCT:2,PLT:2 in the last 72 hours BMET: No results found for this basename: NA:2,K:2,CL:2,CO2:2,GLUCOSE:2,BUN:2,CREATININE:2,CALCIUM:2 in the last 72 hours  PT/INR:   Basename 09/21/11 0548  LABPROT 18.3*  INR 1.49    Discharge Medications:  Medication List  As of 09/21/2011  7:42 AM   STOP taking these  medications         benazepril 20 MG tablet      clopidogrel 75 MG tablet      ezetimibe-simvastatin 10-20 MG per tablet      isosorbide mononitrate 30 MG 24 hr tablet      ranolazine 500 MG 12 hr tablet         TAKE these medications         acetaminophen 325 MG tablet   Commonly known as: TYLENOL   Take 650 mg by mouth every 4 (four) hours as needed. For pain      amiodarone 400 MG tablet   Commonly known as: PACERONE   Take 2 tabs (400 mg) po bid x  1 week, then decrease to 1 tab (200 mg) po bid      aspirin 81 MG EC tablet   Take 1 tablet (81 mg total) by mouth daily.      atorvastatin 10 MG tablet   Commonly known as: LIPITOR   Take 1 tablet (10 mg total) by mouth daily at 6 PM.      cephALEXin 500 MG capsule   Commonly known as: KEFLEX   Take 1 capsule (500 mg total) by mouth every 8 (eight) hours.      cetirizine 10 MG tablet   Commonly known as: ZYRTEC   Take 10 mg by mouth daily.      diltiazem 90 MG tablet   Commonly known as: CARDIZEM   Take 1 tablet (90 mg total) by mouth every 8 (eight) hours.      ESTER C PO   Take 500 mg by mouth daily.      ezetimibe 10 MG tablet   Commonly known as: ZETIA   Take 1 tablet (10 mg total) by mouth daily.      FISH OIL PO   Take 1,000 mg by mouth 2 (two) times daily.      fluticasone 27.5 MCG/SPRAY nasal spray   Commonly known as: VERAMYST   Place 2 sprays into the nose daily as needed. For allergies      folic acid 1 MG tablet   Commonly known as: FOLVITE   Take 1 tablet (1 mg total) by mouth daily.      furosemide 40 MG tablet   Commonly known as: LASIX   Take 1 tablet (40 mg total) by mouth daily. For 7 days      iron polysaccharides 150 MG capsule   Commonly known as: NIFEREX   Take 1 capsule (150 mg total) by mouth daily.      metoprolol tartrate 25 MG tablet   Commonly known as: LOPRESSOR   Take 1 tablet (25 mg total) by mouth 2 (two) times daily.      montelukast 10 MG tablet   Commonly known as: SINGULAIR   Take 10 mg by mouth at bedtime.      multivitamin with minerals Tabs   Take 1 tablet by mouth daily.      niacin 1000 MG CR tablet   Commonly known as: NIASPAN   Take 1,000 mg by mouth at bedtime.      nitroGLYCERIN 0.4 MG SL tablet   Commonly known as: NITROSTAT   Place 0.4 mg under the tongue every 5 (five) minutes as needed. Chest pain      omeprazole 20 MG capsule   Commonly known as: PRILOSEC   Take 20 mg by mouth daily.      OSTEO  BI-FLEX ADV  TRIPLE ST PO   Take 1 tablet by mouth daily.      oxyCODONE 5 MG immediate release tablet   Commonly known as: Oxy IR/ROXICODONE   Take 1-2 tablets (5-10 mg total) by mouth every 3 (three) hours as needed for pain.      potassium chloride 10 MEQ tablet   Commonly known as: K-DUR   Take 1 tablet (10 mEq total) by mouth 2 (two) times daily. For 7 days      VITAMIN D-3 PO   Take 1,000 Units by mouth 2 (two) times daily.      warfarin 7.5 MG tablet   Commonly known as: COUMADIN   Take 1 tab (7.5 mg) po daily or as directed by the [facility]             Discharge Instructions:  The patient is to refrain from driving, heavy lifting or strenuous activity.  May shower daily and clean incisions with soap and water.  May resume regular diet.   Follow Up:  Discharge Orders    Future Appointments: Provider: Department: Dept Phone: Center:   10/19/2011 12:30 PM Delight Ovens, MD [facility] [phone] [facility]      Follow-up Information    Follow up with GERHARDT,EDWARD B, MD on 10/19/2011. (Have a chest x-ray at 11:30, then see MD at 12:30)    Contact information:   [address] [place] [address] [phone]       Follow up with Cardiologist Office. (Have bloodwork for Coumadin (PT/INR) at  [facility]  and Vascular within 48 hours of discharge)       Follow up with Abelino Derrick, PA on 09/29/2011. (3:30pm)    Contact information:   [address] [place] [address] [phone]           Lowella Dandy 09/21/2011, 7:42 AM

## 2011-09-19 NOTE — Progress Notes (Signed)
Subjective:  Up in chair, no complaints  Objective:  Vital Signs in the last 24 hours: Temp:  [98.3 F (36.8 C)-99 F (37.2 C)] 98.3 F (36.8 C) (08/27 0428) Pulse Rate:  [93-122] 101  (08/27 1029) Resp:  [18-20] 18  (08/27 0428) BP: (110-138)/(63-80) 110/65 mmHg (08/27 1029) SpO2:  [91 %-95 %] 91 % (08/27 0428) Weight:  [129.321 kg (285 lb 1.6 oz)] 129.321 kg (285 lb 1.6 oz) (08/27 0428)  Intake/Output from previous day:  Intake/Output Summary (Last 24 hours) at 09/19/11 1052 Last data filed at 09/19/11 1026  Gross per 24 hour  Intake    963 ml  Output    800 ml  Net    163 ml    Physical Exam: General appearance: alert, cooperative and no distress Lungs: decreased breath sounds at bases, large lipoma on his back (chronic) Heart: irregularly irregular rhythm   Rate: 90  Rhythm: atrial fibrillation  Lab Results:  Basename 09/18/11 0500 09/17/11 0524  WBC 10.5 11.0*  HGB 9.8* 9.6*  PLT 202 192    Basename 09/18/11 0500 09/17/11 0524  NA 136 137  K 3.7 4.2  CL 97 98  CO2 29 31  GLUCOSE 107* 119*  BUN 15 15  CREATININE 0.76 0.76   No results found for this basename: TROPONINI:2,CK,MB:2 in the last 72 hours Hepatic Function Panel No results found for this basename: PROT,ALBUMIN,AST,ALT,ALKPHOS,BILITOT,BILIDIR,IBILI in the last 72 hours No results found for this basename: CHOL in the last 72 hours  Basename 09/19/11 0455  INR 1.23    Imaging: Imaging results have been reviewed  Cardiac Studies:  Assessment/Plan:   Principal Problem:  *CAD (coronary artery disease), CABG '96, re do CABG 09/11/11 X 3 Active Problems:  Atrial fibrillation with RVR post op re do CABG, (Sinus bradycardia pre op), Amiodarone added  HTN (hypertension), now hypotensive post op in AF  Sleep apnea, on C-pap   Ischemic cardiomyopathy, "moderate LVD" at cath 09/01/11  Anticoagulated on Coumadin, new post op re do CABG  Bradycardia, beta blocker decreased 3/13   Dyslipidemia   Plan- INR still subtheraputic. He is for d/c in AM. Currently. Currently on Amio 400mg  BID, Lopressor 25mg  BID, and Diltiazem 90mg  TID. He had bradycardia pre op when in in NSR. Will discuss meds with MD today.   Corine Shelter PA-C 09/19/2011, 10:52 AM  I have seen and examined the patient along with Corine Shelter PA-C.  I have reviewed the chart, notes and new data.  I agree with PA's note.  PLAN: There is indeed some concern re: bradycardia should he convert to normal rhythm. I would bring back for early follow up (7-10 days) and we will likely dc diltiazem as amiodarone fully "kicks in". For this reason would keep on short acting diltiazem despite the inconvenient TID dosing schedule.   If still in AF after 3-4 weeks and he has had adequate uninterrupted anticoagulation, would schedule for an elective synchronized cardioversion.   Thurmon Fair, MD, [facility] [phone] 09/19/2011, 1:42 PM

## 2011-09-20 LAB — PROTIME-INR
INR: 1.39 (ref 0.00–1.49)
Prothrombin Time: 17.3 seconds — ABNORMAL HIGH (ref 11.6–15.2)

## 2011-09-20 LAB — GLUCOSE, CAPILLARY
Glucose-Capillary: 96 mg/dL (ref 70–99)
Glucose-Capillary: 129 mg/dL — ABNORMAL HIGH (ref 70–99)
Glucose-Capillary: 103 mg/dL — ABNORMAL HIGH (ref 70–99)
Glucose-Capillary: 106 mg/dL — ABNORMAL HIGH (ref 70–99)

## 2011-09-20 MED ORDER — FUROSEMIDE 40 MG PO TABS: 40.0000 mg | ORAL_TABLET | Freq: Every day | ORAL | Status: DC

## 2011-09-20 MED ORDER — CEPHALEXIN 500 MG PO CAPS: 500.0000 mg | ORAL_CAPSULE | Freq: Three times a day (TID) | ORAL | Status: AC

## 2011-09-20 MED ORDER — POTASSIUM CHLORIDE CRYS ER 20 MEQ PO TBCR
40.0000 meq | EXTENDED_RELEASE_TABLET | Freq: Once | ORAL | Status: AC
  Administered 2011-09-20: 40 meq via ORAL
  Filled 2011-09-20: qty 2

## 2011-09-20 MED ORDER — CEPHALEXIN 500 MG PO CAPS
500.0000 mg | ORAL_CAPSULE | Freq: Three times a day (TID) | ORAL | Status: DC
  Administered 2011-09-20 – 2011-09-21 (×5): 500 mg via ORAL
  Filled 2011-09-20 (×7): qty 1

## 2011-09-20 MED ORDER — POTASSIUM CHLORIDE ER 10 MEQ PO TBCR: 10.0000 meq | EXTENDED_RELEASE_TABLET | Freq: Two times a day (BID) | ORAL | Status: DC

## 2011-09-20 MED ORDER — FUROSEMIDE 10 MG/ML IJ SOLN
40.0000 mg | Freq: Once | INTRAMUSCULAR | Status: AC
  Administered 2011-09-20: 40 mg via INTRAVENOUS
  Filled 2011-09-20: qty 4

## 2011-09-20 NOTE — Progress Notes (Signed)
Pt. Seen and examined. Agree with the NP/PA-C note as written.  Could decrease amiodarone to 400 mg daily for maintenance. Will arrange outpatient follow-up for likely cardioversion in >1 month. Sign-off. Call with questions.  Chrystie Nose, MD, [facility] Attending Cardiologist [facility]

## 2011-09-20 NOTE — Progress Notes (Signed)
CARDIAC REHAB PHASE I   PRE:  Rate/Rhythm: 70afib  BP:  Supine: 98/42  Sitting:   Standing:    SaO2: 93%RA  MODE:  Ambulation: 740 ft   POST:  Rate/Rhythem: 97afib  BP:  Supine:   Sitting: left 140/80, right 150/70  Standing:     SaO2: 95%RA 1014-1040 Pt walked 740 ft with rolling walker with steady gait. Tolerated well. BP low prior to walk but improved with ex. HR controlled with rate to 97. Pt took a couple of standing rest breaks. To sitting on side of bed after walk.  [name]

## 2011-09-20 NOTE — Progress Notes (Addendum)
9 Days Post-Op Procedure(s) (LRB): REDO CORONARY ARTERY BYPASS GRAFTING (CABG) (N/A) TRANSESOPHAGEAL ECHOCARDIOGRAM (TEE) (N/A)  Subjective:  [name] has a new complaint of redness along his EVH site this morning.  He states it did not look that reddened yesterday.   Objective: Vital signs in last 24 hours: Temp:  [98.6 F (37 C)-98.8 F (37.1 C)] 98.6 F (37 C) (08/28 0452) Pulse Rate:  [79-101] 79  (08/28 0452) Cardiac Rhythm:  [-] Atrial fibrillation (08/27 2043) Resp:  [18-20] 20  (08/28 0452) BP: (108-129)/(65-72) 117/71 mmHg (08/28 0637) SpO2:  [92 %-94 %] 92 % (08/28 0452) Weight:  [284 lb 11.2 oz (129.139 kg)] 284 lb 11.2 oz (129.139 kg) (08/28 0452)  Intake/Output from previous day: 08/27 0701 - 08/28 0700 In: 723 [P.O.:720; I.V.:3] Out: 1225 [Urine:1225]  General appearance: alert, cooperative and no distress Heart: regular rate and rhythm Lungs: clear to auscultation bilaterally Abdomen: soft, non-tender; bowel sounds normal; no masses,  no organomegaly Extremities: edema 2-3+ Wound: clean, Left EVH site reddened appears to be early cellulitis  Lab Results:  Basename 09/18/11 0500  WBC 10.5  HGB 9.8*  HCT 29.8*  PLT 202   BMET:  Basename 09/18/11 0500  NA 136  K 3.7  CL 97  CO2 29  GLUCOSE 107*  BUN 15  CREATININE 0.76  CALCIUM 9.2    PT/INR:  Basename 09/20/11 0600  LABPROT 17.3*  INR 1.39   ABG    Component Value Date/Time   PHART 7.406 09/11/2011 2115   HCO3 26.1* 09/11/2011 2115   TCO2 23 09/12/2011 1644   ACIDBASEDEF 2.0 09/11/2011 1642   O2SAT 89.0 09/11/2011 2115   CBG (last 3)   Basename 09/20/11 0543 09/19/11 2117 09/19/11 1604  GLUCAP 96 102* 96    Assessment/Plan: S/P Procedure(s) (LRB): REDO CORONARY ARTERY BYPASS GRAFTING (CABG) (N/A) TRANSESOPHAGEAL ECHOCARDIOGRAM (TEE) (N/A)  1. CV- In and out of Atrial Fibrillation, NSR this morning, on Amiodarone, Lopressor, and Cardizem 2. INR 1.39- has received 3 doses of 7.5mg   of coumadin with slow response, however on Amiodarone will continue dose for now 3. Left EVH site- cellulitis- will start Keflex 500mg  TID for 10 days 4. Pulm- no acute issues, continue IS 5. Volume Overload- patients weight remains elevated up 14lbs since admission, lower extremity edema, will give IV Lasix today 6. Dispo- patient with new onset cellulitis, will start Keflex, + LE edema will give dose of IV diuresis today, patient will benefit from one more day in the hospital for diuresis and to ensure wound does not progress, will plan for d/c in the AM   LOS: 9 days    Nicholas Lynn 09/20/2011   Patient seen and examined. Agree with A/P above

## 2011-09-20 NOTE — Progress Notes (Signed)
Subjective:  A little SOB  Objective:  Vital Signs in the last 24 hours: Temp:  [98.6 F (37 C)-98.8 F (37.1 C)] 98.6 F (37 C) (08/28 0452) Pulse Rate:  [79-101] 79  (08/28 0452) Resp:  [18-20] 20  (08/28 0452) BP: (108-129)/(65-72) 117/71 mmHg (08/28 0637) SpO2:  [92 %-94 %] 92 % (08/28 0452) Weight:  [129.139 kg (284 lb 11.2 oz)] 129.139 kg (284 lb 11.2 oz) (08/28 0452)  Intake/Output from previous day:  Intake/Output Summary (Last 24 hours) at 09/20/11 1001 Last data filed at 09/20/11 0730  Gross per 24 hour  Intake    723 ml  Output   1725 ml  Net  -1002 ml    Physical Exam: General appearance: alert, cooperative, no distress and moderately obese Lungs: clear to auscultation bilaterally Heart: irregularly irregular rhythm   Rate: 76  Rhythm: atrial fibrillation  Lab Results:  Basename 09/18/11 0500  WBC 10.5  HGB 9.8*  PLT 202    Basename 09/18/11 0500  NA 136  K 3.7  CL 97  CO2 29  GLUCOSE 107*  BUN 15  CREATININE 0.76   No results found for this basename: TROPONINI:2,CK,MB:2 in the last 72 hours Hepatic Function Panel No results found for this basename: PROT,ALBUMIN,AST,ALT,ALKPHOS,BILITOT,BILIDIR,IBILI in the last 72 hours No results found for this basename: CHOL in the last 72 hours  Basename 09/20/11 0600  INR 1.39    Imaging: Dg Chest 2 View  09/19/2011  *RADIOLOGY REPORT*  Clinical Data: Post redo CABG  CHEST - 2 VIEW  Comparison: 09/15/2011; a 5.02/2011; 09/13/2011; 09/12/2011  Findings:  Grossly unchanged enlarged cardiac silhouette and mediastinal contours post median sternotomy.  There is persistent tortuosity/ectasia of the thoracic aorta.  Interval removal of a right jugular approach vascular sheath.  Overall improved inspiratory effort with improved aeration of the bilateral lung bases.  Small bilateral pleural effusions with fluid tracking along the right lateral chest wall and within the minor fissure. Bibasilar heterogeneous  opacities.  Grossly unchanged bones.  IMPRESSION: Overall improved inspiratory effort with likely unchanged small bilateral effusions and bibasilar opacities, favored to represent atelectasis.   Original Report Authenticated By: Waynard Reeds, M.D.     Cardiac Studies:  Assessment/Plan:   Principal Problem:  *CAD (coronary artery disease), CABG '96, re do CABG 09/11/11 X 3 Active Problems:  Atrial fibrillation with RVR post op re do CABG, (Sinus bradycardia pre op), Amiodarone added  HTN (hypertension), now hypotensive post op in AF  Sleep apnea, on C-pap   Ischemic cardiomyopathy, "moderate LVD" at cath 09/01/11  Anticoagulated on Coumadin, new post op re do CABG  Bradycardia, beta blocker decreased 3/13  Dyslipidemia   Plan- Currently on Amiodarone 400mg  BID since 8/23, Lopressor 25mg  BID, and Diltiazem 90mg  TID. Consider stopping or cutting back Diltiazem and decreasing Amiodarone to 400mg  daily before discharge. We will see him in the office 9/6. Our office will contact him for INR check. He is to receive extra Lasix today and started on ABs for wound cellulitis.    [employer] PA-C 09/20/2011, 10:01 AM

## 2011-09-21 LAB — GLUCOSE, CAPILLARY
Glucose-Capillary: 100 mg/dL — ABNORMAL HIGH (ref 70–99)
Glucose-Capillary: 115 mg/dL — ABNORMAL HIGH (ref 70–99)

## 2011-09-21 LAB — PROTIME-INR
INR: 1.49 (ref 0.00–1.49)
Prothrombin Time: 18.3 seconds — ABNORMAL HIGH (ref 11.6–15.2)

## 2011-09-21 MED ORDER — FUROSEMIDE 10 MG/ML IJ SOLN
40.0000 mg | Freq: Once | INTRAMUSCULAR | Status: AC
  Administered 2011-09-21: 40 mg via INTRAVENOUS
  Filled 2011-09-21: qty 4

## 2011-09-21 NOTE — Progress Notes (Signed)
Nutrition Brief Note  Chart reviewed. Patient s/p redo CABG 8/19.  Body mass index is 36.41 kg/(m^2). Pt meets criteria for Obesity Class II based on current BMI.   Current diet order is Carbohydrate Modified Medium Calorie, patient is consuming approximately 100% of meals at this time. Labs and medications reviewed.   No nutrition interventions warranted at this time. If nutrition issues arise, please consult RD.   Kirkland Hun, RD, LDN Pager #: [phone] After-Hours Pager #: [phone]

## 2011-09-21 NOTE — Progress Notes (Signed)
[id number] Cardiac Rehab  Completed discharge education with pt and wife. Pt agrees to Outpt. CRP in [place], will send referral.

## 2011-09-21 NOTE — Progress Notes (Signed)
10 Days Post-Op Procedure(s) (LRB): REDO CORONARY ARTERY BYPASS GRAFTING (CABG) (N/A) TRANSESOPHAGEAL ECHOCARDIOGRAM (TEE) (N/A)  Subjective: Nicholas Lynn has no complaints this morning.    Objective: Vital signs in last 24 hours: Temp:  [97.8 F (36.6 C)-98.6 F (37 C)] 98.2 F (36.8 C) (08/29 0547) Pulse Rate:  [66-78] 70  (08/29 0547) Cardiac Rhythm:  [-] Normal sinus rhythm (08/28 2008) Resp:  [18] 18  (08/29 0547) BP: (112-128)/(66-71) 126/66 mmHg (08/29 0547) SpO2:  [94 %-97 %] 97 % (08/29 0547) Weight:  [283 lb 11.7 oz (128.7 kg)] 283 lb 11.7 oz (128.7 kg) (08/29 0547)  Intake/Output from previous day: 08/28 0701 - 08/29 0700 In: 1040 [P.O.:1040] Out: 5175 [Urine:5175]  General appearance: alert, cooperative and no distress Neurologic: intact Heart: regular rate and rhythm Lungs: clear to auscultation bilaterally Abdomen: soft, non-tender; bowel sounds normal; no masses,  no organomegaly Extremities: edema 2-3+ Wound: clean, LLE EVH site erythematous, no drainage present  Lab Results: No results found for this basename: WBC:2,HGB:2,HCT:2,PLT:2 in the last 72 hours BMET: No results found for this basename: NA:2,K:2,CL:2,CO2:2,GLUCOSE:2,BUN:2,CREATININE:2,CALCIUM:2 in the last 72 hours  PT/INR:  Basename 09/21/11 0548  LABPROT 18.3*  INR 1.49   ABG    Component Value Date/Time   PHART 7.406 09/11/2011 2115   HCO3 26.1* 09/11/2011 2115   TCO2 23 09/12/2011 1644   ACIDBASEDEF 2.0 09/11/2011 1642   O2SAT 89.0 09/11/2011 2115   CBG (last 3)   Basename 09/21/11 0550 09/20/11 2043 09/20/11 1640  GLUCAP 100* 106* 103*    Assessment/Plan: S/P Procedure(s) (LRB): REDO CORONARY ARTERY BYPASS GRAFTING (CABG) (N/A) TRANSESOPHAGEAL ECHOCARDIOGRAM (TEE) (N/A)  1. CV- Episodes of atrial fibrillation, NSR this morning, on Lopressor, Amiodarone which is decreased per Cardiology recommendations, and Cardizem 2. INR 1.49- slowly responsive however on Amiodarone and  Antibiotics, will continue 7.5mg  daily 3. Volume Overload- patients weight remains elevated, IV lasix given yesterday with 5L U/O, will repeat dosage today prior to d/c home 4. Pulm- no issues, continue IS 5. Cellulitis- on Keflex 6. Dispo- patient doing well, will plan to discharge home today.   LOS: 10 days    Nicholas Lynn 09/21/2011

## 2011-09-26 ENCOUNTER — Ambulatory Visit (INDEPENDENT_AMBULATORY_CARE_PROVIDER_SITE_OTHER): Payer: Self-pay | Admitting: Physician Assistant

## 2011-09-26 VITALS — BP 120/66 | HR 63 | Temp 98.3°F | Resp 16 | Ht 74.0 in | Wt 275.0 lb

## 2011-09-26 DIAGNOSIS — I2510 Atherosclerotic heart disease of native coronary artery without angina pectoris: Secondary | ICD-10-CM

## 2011-09-26 DIAGNOSIS — Z951 Presence of aortocoronary bypass graft: Secondary | ICD-10-CM

## 2011-09-26 NOTE — Progress Notes (Signed)
[address]            [place] [address]          [phone]     HPI: Patient returns for a 1 week wound check.  He is status post redo CABG by Dr. Tyrone Lynn on 09/11/2011.  The patient's postoperative course was notable for atrial fibrillation, for which he was started on Amiodarone, Lopressor, Cardizem and Coumadin.  He had converted to sinus rhythm by the date of discharge.  He also had a cellulitis of the left lower extremity EVH site, and was treated with Keflex.  He was discharged home on 09/21/2011 in good condition.  Since hospital discharge, the patient has done well.  He had his INR checked on Friday, 8/30, at [facility], and his INR was 1.6.  His Coumadin dose was changed, and he now takes 7.5 mg, 1 tab on Sunday, Monday, Wednesday and Friday, and 1.5 tabs on Tuesday, Thursday, and Saturday.  He will have a repeat INR drawn later this week, and is scheduled for follow up with Nicholas Shelter, PA-C at that time.  He continues to have lower extremity edema, but this is improving.  His EVH site is looking better, and he denies drainage, fevers or chills.  He remains on Keflex.  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    Current Outpatient Prescriptions  Medication Sig Dispense Refill  . acetaminophen (TYLENOL) 325 MG tablet Take 650 mg by mouth every 4 (four) hours as needed. For pain      . amiodarone (PACERONE) 400 MG tablet Take 2 tabs (400 mg) po bid x 1 week, then decrease to 1 tab (200 mg) po bid  75 tablet  1  . aspirin EC 81 MG EC tablet Take 1 tablet (81 mg total) by mouth daily.      Marland Kitchen atorvastatin (LIPITOR) 10 MG tablet Take 1 tablet (10 mg total) by mouth daily at 6 PM.  30 tablet  1  . Bioflavonoid Products (ESTER C PO) Take 500 mg by mouth daily.        . cephALEXin (KEFLEX) 500 MG capsule Take 1 capsule (500 mg total) by mouth every 8 (eight) hours.  30  capsule  0  . cetirizine (ZYRTEC) 10 MG tablet Take 10 mg by mouth daily.        . Cholecalciferol (VITAMIN D-3 PO) Take 1,000 Units by mouth 2 (two) times daily.        Marland Kitchen diltiazem (CARDIZEM) 90 MG tablet Take 1 tablet (90 mg total) by mouth every 8 (eight) hours.  90 tablet  1  . ezetimibe (ZETIA) 10 MG tablet Take 1 tablet (10 mg total) by mouth daily.  30 tablet  1  . fluticasone (VERAMYST) 27.5 MCG/SPRAY nasal spray Place 2 sprays into the nose daily as needed. For allergies      . folic acid (FOLVITE) 1 MG tablet Take 1 tablet (1 mg total) by mouth daily.  30 tablet  1  . furosemide (LASIX) 40 MG tablet Take 1 tablet (40 mg total) by mouth daily. For 7 days  7 tablet  0  . iron polysaccharides (NIFEREX) 150 MG capsule Take 1 capsule (150 mg total) by mouth daily.  30 capsule  1  . metoprolol (LOPRESSOR) 25  MG tablet Take 1 tablet (25 mg total) by mouth 2 (two) times daily.  60 tablet  1  . Misc Natural Products (OSTEO BI-FLEX ADV TRIPLE ST PO) Take 1 tablet by mouth daily.       . montelukast (SINGULAIR) 10 MG tablet Take 10 mg by mouth at bedtime.       . Multiple Vitamin (MULTIVITAMIN WITH MINERALS) TABS Take 1 tablet by mouth daily.      . niacin (NIASPAN) 1000 MG CR tablet Take 1,000 mg by mouth at bedtime.        . nitroGLYCERIN (NITROSTAT) 0.4 MG SL tablet Place 0.4 mg under the tongue every 5 (five) minutes as needed. Chest pain      . Omega-3 Fatty Acids (FISH OIL PO) Take 1,000 mg by mouth 2 (two) times daily.        Marland Kitchen omeprazole (PRILOSEC) 20 MG capsule Take 20 mg by mouth daily.       Marland Kitchen oxyCODONE (OXY IR/ROXICODONE) 5 MG immediate release tablet Take 1-2 tablets (5-10 mg total) by mouth every 3 (three) hours as needed for pain.  30 tablet  0  . potassium chloride (K-DUR) 10 MEQ tablet Take 1 tablet (10 mEq total) by mouth 2 (two) times daily. For 7 days  14 tablet  0  . warfarin (COUMADIN) 7.5 MG tablet Take 1 tab (7.5 mg) po daily or as directed by the Coumadin Clinic  30 tablet   1    Physical Exam: BP 120/66 HR 63 Resp 16 Wounds: Sternal, chest tube and EVH incisions are all healing well.  There is little remaining erythema surrounding the [facility] site. Heart: regular rate and rhythm Lungs: Clear Extremities: 1+ lower extremity edema bilaterally     Assessment/Plan: Nicholas Lynn is making steady progress.  His cellulitis is healing well.  He will continue the course of Keflex until gone.  He also remains volume overloaded, and we will continue his course of Lasix.  I gave him a refill on Oxycodone 10 mg, #40.  He will follow up as directed with cardiology.  He will return to see Dr. Tyrone Lynn with a chest x-ray as previously scheduled, and may call in the interim if he has any problems.

## 2011-10-05 ENCOUNTER — Other Ambulatory Visit: Payer: Self-pay

## 2011-10-05 DIAGNOSIS — G8918 Other acute postprocedural pain: Secondary | ICD-10-CM

## 2011-10-05 MED ORDER — OXYCODONE HCL 5 MG PO TABS: 5.0000 mg | ORAL_TABLET | ORAL | Status: DC | PRN

## 2011-10-05 NOTE — Telephone Encounter (Signed)
RX printed out for Oxycodone 5 mg 1 tab po every 4-6 hours prn pain #40/0 refills. Dr Tyrone Sage signed and pt will pick up today before 1700.

## 2011-10-18 ENCOUNTER — Other Ambulatory Visit: Payer: Self-pay | Admitting: Cardiothoracic Surgery

## 2011-10-18 DIAGNOSIS — I2510 Atherosclerotic heart disease of native coronary artery without angina pectoris: Secondary | ICD-10-CM

## 2011-10-19 ENCOUNTER — Encounter: Payer: Self-pay | Admitting: Cardiothoracic Surgery

## 2011-10-19 ENCOUNTER — Ambulatory Visit (INDEPENDENT_AMBULATORY_CARE_PROVIDER_SITE_OTHER): Payer: Self-pay | Admitting: Cardiothoracic Surgery

## 2011-10-19 ENCOUNTER — Ambulatory Visit
Admission: RE | Admit: 2011-10-19 | Discharge: 2011-10-19 | Disposition: A | Discharge status: Home / Self Care | Payer: Medicare Other | Source: Ambulatory Visit | Attending: Cardiothoracic Surgery | Admitting: Cardiothoracic Surgery

## 2011-10-19 ENCOUNTER — Other Ambulatory Visit: Payer: Self-pay

## 2011-10-19 VITALS — BP 124/72 | HR 55 | Temp 97.5°F | Resp 18 | Ht 74.0 in | Wt 275.0 lb

## 2011-10-19 DIAGNOSIS — L0291 Cutaneous abscess, unspecified: Secondary | ICD-10-CM

## 2011-10-19 DIAGNOSIS — Z951 Presence of aortocoronary bypass graft: Secondary | ICD-10-CM

## 2011-10-19 DIAGNOSIS — I2510 Atherosclerotic heart disease of native coronary artery without angina pectoris: Secondary | ICD-10-CM

## 2011-10-19 DIAGNOSIS — L0390 Cellulitis, unspecified: Secondary | ICD-10-CM

## 2011-10-19 MED ORDER — HYDROCODONE-ACETAMINOPHEN 5-500 MG PO TABS: 1.0000 | ORAL_TABLET | Freq: Four times a day (QID) | ORAL | Status: DC | PRN

## 2011-10-19 NOTE — Progress Notes (Signed)
[address]            [place] [address]          [phone]       Nicholas Lynn [facility] Medical Record #[id number] Date of Birth: 1945/04/09  Marykay Lex, MD [facility], MD  Chief Complaint:   PostOp Follow Up Visit 09/11/2011  DATE OF DISCHARGE:  OPERATIVE REPORT  PREOPERATIVE DIAGNOSIS: Recurrent coronary artery occlusive disease.  POSTOPERATIVE DIAGNOSIS: Recurrent coronary artery occlusive disease.  SURGICAL PROCEDURE: Redo coronary artery bypass grafting, with use of  the right internal mammary artery as a free graft to the posterior  descending coronary artery, reverse saphenous vein graft to the 2nd  obtuse marginal, reverse saphenous vein graft to the ramus intermedius  with bilateral thigh vein endo harvest.   History of Present Illness:      Some chest discomfort, no angina or heart failure post op. Waking well. Will start cardiac rehab soon    History  Smoking status  . Former Smoker  . Quit date: 03/27/1986  Smokeless tobacco  . Never Used       Allergies  Allergen Reactions  . Sulfur Swelling    Causes all joints to swell. Unable to bend them.    Current Outpatient Prescriptions  Medication Sig Dispense Refill  . acetaminophen (TYLENOL) 325 MG tablet Take 650 mg by mouth every 4 (four) hours as needed. For pain      . amiodarone (PACERONE) 400 MG tablet Take 200 mg by mouth daily. 200 mg po every day      . aspirin EC 81 MG EC tablet Take 1 tablet (81 mg total) by mouth daily.      Marland Kitchen atorvastatin (LIPITOR) 10 MG tablet Take 1 tablet (10 mg total) by mouth daily at 6 PM.  30 tablet  1  . Bioflavonoid Products (ESTER C PO) Take 500 mg by mouth daily.        . cetirizine (ZYRTEC) 10 MG tablet Take 10 mg by mouth daily.        . Cholecalciferol (VITAMIN D-3 PO) Take 1,000 Units by mouth 2 (two) times daily.        Marland Kitchen ezetimibe (ZETIA) 10 MG tablet Take 1 tablet (10 mg total) by mouth daily.  30  tablet  1  . fluticasone (VERAMYST) 27.5 MCG/SPRAY nasal spray Place 2 sprays into the nose daily as needed. For allergies      . folic acid (FOLVITE) 1 MG tablet Take 1 tablet (1 mg total) by mouth daily.  30 tablet  1  . furosemide (LASIX) 40 MG tablet Take 1 tablet (40 mg total) by mouth daily. For 7 days  7 tablet  0  . iron polysaccharides (NIFEREX) 150 MG capsule Take 1 capsule (150 mg total) by mouth daily.  30 capsule  1  . metoprolol (LOPRESSOR) 25 MG tablet Take 1 tablet (25 mg total) by mouth 2 (two) times daily.  60 tablet  1  . Misc Natural Products (OSTEO BI-FLEX ADV TRIPLE ST PO) Take 1 tablet by mouth daily.       . montelukast (SINGULAIR) 10 MG tablet Take 10 mg by mouth at bedtime.       . Multiple Vitamin (MULTIVITAMIN WITH MINERALS) TABS Take 1 tablet by mouth daily.      . niacin (NIASPAN) 1000 MG CR tablet Take  1,000 mg by mouth at bedtime.        . nitroGLYCERIN (NITROSTAT) 0.4 MG SL tablet Place 0.4 mg under the tongue every 5 (five) minutes as needed. Chest pain      . Omega-3 Fatty Acids (FISH OIL PO) Take 1,000 mg by mouth 2 (two) times daily.        . potassium chloride (K-DUR) 10 MEQ tablet Take 1 tablet (10 mEq total) by mouth 2 (two) times daily. For 7 days  14 tablet  0  . warfarin (COUMADIN) 7.5 MG tablet Take 1 tab (7.5 mg) po daily or as directed by the Coumadin Clinic  30 tablet  1  . DISCONTD: amiodarone (PACERONE) 400 MG tablet Take 2 tabs (400 mg) po bid x 1 week, then decrease to 1 tab (200 mg) po bid  75 tablet  1  . omeprazole (PRILOSEC) 20 MG capsule Take 20 mg by mouth daily.            Physical Exam: BP 124/72  Pulse 55  Temp 97.5 F (36.4 C) (Oral)  Resp 18  Ht 6\' 2"  (1.88 m)  Wt 275 lb (124.739 kg)  BMI 35.31 kg/m2  SpO2 95%  General appearance: alert, cooperative and no distress Neurologic: intact Heart: regular rate and rhythm, S1, S2 normal, no murmur, click, rub or gallop and normal apical impulse Lungs: clear to auscultation  bilaterally and normal percussion bilaterally Abdomen: soft, non-tender; bowel sounds normal; no masses,  no organomegaly Extremities: extremities normal, atraumatic, no cyanosis or edema, Homans sign is negative, no sign of DVT and left leg incision well healed no infection Wound: sternum stable   Diagnostic Studies & Laboratory data:         Recent Radiology Findings: Dg Chest 2 View  10/19/2011  *RADIOLOGY REPORT*  Clinical Data: Coronary artery disease.  Recent CABG.  CHEST - 2 VIEW  Comparison: 09/19/2011  Findings: The pleural effusions have resolved.  No pneumothorax. Heart size and pulmonary vascularity are normal.  No acute osseous abnormality.  Evidence of prior CABG.  IMPRESSION: No acute abnormalities.  Complete resolution of the pleural effusions.   Original Report Authenticated By: Gwynn Burly, M.D.       Recent Labs: Lab Results  Component Value Date   WBC 10.5 09/18/2011   HGB 9.8* 09/18/2011   HCT 29.8* 09/18/2011   PLT 202 09/18/2011   GLUCOSE 107* 09/18/2011   ALT 16 09/08/2011   AST 19 09/08/2011   NA 136 09/18/2011   K 3.7 09/18/2011   CL 97 09/18/2011   CREATININE 0.76 09/18/2011   BUN 15 09/18/2011   CO2 29 09/18/2011   INR 1.49 09/21/2011   HGBA1C 6.1* 09/08/2011      Assessment / Plan:      Getting Protime checked today No Afib today Doing well post op Cordarone dose now to 200/ day See one month       Gaby Harney B 10/19/2011 12:40 PM

## 2011-10-19 NOTE — Patient Instructions (Signed)
No lifting over 25 lbs for 3 months Start cardiac rehab See me one month

## 2011-11-07 ENCOUNTER — Other Ambulatory Visit: Payer: Self-pay | Admitting: Physician Assistant

## 2011-11-09 ENCOUNTER — Encounter (HOSPITAL_COMMUNITY)
Admission: RE | Admit: 2011-11-09 | Discharge: 2011-11-09 | Disposition: A | Discharge status: Home / Self Care | Payer: Medicare Other | Source: Ambulatory Visit | Attending: Cardiovascular Disease | Admitting: Cardiovascular Disease

## 2011-11-09 DIAGNOSIS — Z9861 Coronary angioplasty status: Secondary | ICD-10-CM | POA: Insufficient documentation

## 2011-11-09 DIAGNOSIS — E785 Hyperlipidemia, unspecified: Secondary | ICD-10-CM | POA: Insufficient documentation

## 2011-11-09 DIAGNOSIS — G4730 Sleep apnea, unspecified: Secondary | ICD-10-CM | POA: Insufficient documentation

## 2011-11-09 DIAGNOSIS — Z882 Allergy status to sulfonamides status: Secondary | ICD-10-CM | POA: Insufficient documentation

## 2011-11-09 DIAGNOSIS — Z7982 Long term (current) use of aspirin: Secondary | ICD-10-CM | POA: Insufficient documentation

## 2011-11-09 DIAGNOSIS — Z951 Presence of aortocoronary bypass graft: Secondary | ICD-10-CM | POA: Insufficient documentation

## 2011-11-09 DIAGNOSIS — Z5189 Encounter for other specified aftercare: Secondary | ICD-10-CM | POA: Insufficient documentation

## 2011-11-09 DIAGNOSIS — I4891 Unspecified atrial fibrillation: Secondary | ICD-10-CM | POA: Insufficient documentation

## 2011-11-09 DIAGNOSIS — I252 Old myocardial infarction: Secondary | ICD-10-CM | POA: Insufficient documentation

## 2011-11-09 DIAGNOSIS — E119 Type 2 diabetes mellitus without complications: Secondary | ICD-10-CM | POA: Insufficient documentation

## 2011-11-09 DIAGNOSIS — Z87891 Personal history of nicotine dependence: Secondary | ICD-10-CM | POA: Insufficient documentation

## 2011-11-09 DIAGNOSIS — Z823 Family history of stroke: Secondary | ICD-10-CM | POA: Insufficient documentation

## 2011-11-09 DIAGNOSIS — K219 Gastro-esophageal reflux disease without esophagitis: Secondary | ICD-10-CM | POA: Insufficient documentation

## 2011-11-09 DIAGNOSIS — I2510 Atherosclerotic heart disease of native coronary artery without angina pectoris: Secondary | ICD-10-CM | POA: Insufficient documentation

## 2011-11-09 DIAGNOSIS — Z79899 Other long term (current) drug therapy: Secondary | ICD-10-CM | POA: Insufficient documentation

## 2011-11-09 DIAGNOSIS — Z801 Family history of malignant neoplasm of trachea, bronchus and lung: Secondary | ICD-10-CM | POA: Insufficient documentation

## 2011-11-09 DIAGNOSIS — I2582 Chronic total occlusion of coronary artery: Secondary | ICD-10-CM | POA: Insufficient documentation

## 2011-11-09 DIAGNOSIS — Z7901 Long term (current) use of anticoagulants: Secondary | ICD-10-CM | POA: Insufficient documentation

## 2011-11-09 DIAGNOSIS — I10 Essential (primary) hypertension: Secondary | ICD-10-CM | POA: Insufficient documentation

## 2011-11-09 DIAGNOSIS — I2589 Other forms of chronic ischemic heart disease: Secondary | ICD-10-CM | POA: Insufficient documentation

## 2011-11-09 NOTE — Progress Notes (Signed)
Cardiac Rehab Medication Review by a Pharmacist  Does the patient  feel that his/her medications are working for him/her?  yes  Has the patient been experiencing any side effects to the medications prescribed?  no  Does the patient measure his/her own blood pressure or blood glucose at home?  yes   Does the patient have any problems obtaining medications due to transportation or finances?   no  Understanding of regimen: good Understanding of indications: good Potential of compliance: good    Pharmacist comments: patient and wife are very aware of medications and seem to have great compliance.   Kalis Friese D. Chanice Brenton, PharmD Clinical Pharmacist Pager: [phone] Phone: [phone] 11/09/2011 8:24 AM

## 2011-11-12 ENCOUNTER — Other Ambulatory Visit: Payer: Self-pay | Admitting: Physician Assistant

## 2011-11-13 ENCOUNTER — Encounter (HOSPITAL_COMMUNITY)
Admission: RE | Admit: 2011-11-13 | Discharge: 2011-11-13 | Disposition: A | Discharge status: Home / Self Care | Payer: Medicare Other | Source: Ambulatory Visit | Attending: Cardiovascular Disease | Admitting: Cardiovascular Disease

## 2011-11-13 ENCOUNTER — Other Ambulatory Visit: Payer: Self-pay | Admitting: Physician Assistant

## 2011-11-13 ENCOUNTER — Encounter (HOSPITAL_COMMUNITY): Payer: Self-pay

## 2011-11-13 NOTE — Progress Notes (Signed)
Pt started cardiac rehab today.  Pt tolerated light exercise without difficulty.  VSS, telemetry-NSR.  Asymptomatic. Pt oriented to exercise equipment and routine.  Understanding verbalized. 

## 2011-11-14 ENCOUNTER — Encounter: Payer: Self-pay | Admitting: Gastroenterology

## 2011-11-15 ENCOUNTER — Encounter (HOSPITAL_COMMUNITY)
Admission: RE | Admit: 2011-11-15 | Discharge: 2011-11-15 | Disposition: A | Discharge status: Home / Self Care | Payer: Medicare Other | Source: Ambulatory Visit | Attending: Cardiovascular Disease | Admitting: Cardiovascular Disease

## 2011-11-15 NOTE — Progress Notes (Signed)
Entry blood pressure 160/88 after having Filimon to rest repeat blood pressure 128/78.  Patient proceeded to warm up on the track blood pressure 170/80.  Exercise stopped.  Repeat blood pressure 152/70.  Dr Kandis Cocking office called and notified.  Nada Boozer FNP-C reviewed exercise flow sheets and ECG tracings.  Cort's metoprolol was increased to 25 mg TID. Dr. Kandis Cocking office is to schedule an echocardiogram for the near future. Intermittent PVC's and an occasional couplet noted.  Nada Boozer FNP-C reviewed ECG tracings. Exit bp 124/74.

## 2011-11-16 ENCOUNTER — Ambulatory Visit: Payer: Medicare Other | Admitting: Cardiothoracic Surgery

## 2011-11-16 NOTE — Progress Notes (Signed)
Addendum to note yesterday.  Barbara at Dr Kandis Cocking office informed Mr. Wheatley of the medication change over the phone.

## 2011-11-17 ENCOUNTER — Encounter (HOSPITAL_COMMUNITY)
Admission: RE | Admit: 2011-11-17 | Discharge: 2011-11-17 | Disposition: A | Discharge status: Home / Self Care | Payer: Medicare Other | Source: Ambulatory Visit | Attending: Cardiovascular Disease | Admitting: Cardiovascular Disease

## 2011-11-20 ENCOUNTER — Encounter (HOSPITAL_COMMUNITY)
Admission: RE | Admit: 2011-11-20 | Discharge: 2011-11-20 | Disposition: A | Discharge status: Home / Self Care | Payer: Medicare Other | Source: Ambulatory Visit | Attending: Cardiovascular Disease | Admitting: Cardiovascular Disease

## 2011-11-22 ENCOUNTER — Encounter (HOSPITAL_COMMUNITY)
Admission: RE | Admit: 2011-11-22 | Discharge: 2011-11-22 | Disposition: A | Discharge status: Home / Self Care | Payer: Medicare Other | Source: Ambulatory Visit | Attending: Cardiovascular Disease | Admitting: Cardiovascular Disease

## 2011-11-22 NOTE — Progress Notes (Signed)
Reviewed home exercise guidelines with patient including endpoints, temperature precautions, target heart rate and rate of perceived exertions. Pt is walking and has access to a recreation center, which he plans to use as his mode of home exercise. Pt voices understanding of instructions given.

## 2011-11-23 ENCOUNTER — Encounter: Payer: Self-pay | Admitting: Cardiothoracic Surgery

## 2011-11-23 ENCOUNTER — Ambulatory Visit (INDEPENDENT_AMBULATORY_CARE_PROVIDER_SITE_OTHER): Payer: Self-pay | Admitting: Cardiothoracic Surgery

## 2011-11-23 VITALS — BP 132/72 | HR 52 | Resp 16 | Ht 74.0 in | Wt 265.0 lb

## 2011-11-23 DIAGNOSIS — I2510 Atherosclerotic heart disease of native coronary artery without angina pectoris: Secondary | ICD-10-CM

## 2011-11-23 DIAGNOSIS — Z951 Presence of aortocoronary bypass graft: Secondary | ICD-10-CM

## 2011-11-23 NOTE — Progress Notes (Signed)
[address]            [place] [address]          [phone]        Nicholas Lynn [facility] Medical Record #[id number] Date of Birth: [birth year]  Cardiology: Dr Onalee Hua, MD  Chief Complaint:   PostOp Follow Up Visit 09/11/2011  DATE OF DISCHARGE:  OPERATIVE REPORT  PREOPERATIVE DIAGNOSIS: Recurrent coronary artery occlusive disease.  POSTOPERATIVE DIAGNOSIS: Recurrent coronary artery occlusive disease.  SURGICAL PROCEDURE: Redo coronary artery bypass grafting, with use of  the right internal mammary artery as a free graft to the posterior  descending coronary artery, reverse saphenous vein graft to the 2nd  obtuse marginal, reverse saphenous vein graft to the ramus intermedius  with bilateral thigh vein endo harvest.   History of Present Illness:      Doing well with cardiac rehab. Some increased BP in rehab and Lopressor was increased No angina, or chf currently. Postop echo done in cardiology office, patient reports was "good"   History  Smoking status  . Former Smoker  . Quit date: 03/27/1986  Smokeless tobacco  . Never Used       Allergies  Allergen Reactions  . Sulfur Swelling    Causes all joints to swell. Unable to bend them.    Current Outpatient Prescriptions  Medication Sig Dispense Refill  . acetaminophen (TYLENOL) 325 MG tablet Take 650 mg by mouth every 4 (four) hours as needed. For pain      . amiodarone (PACERONE) 200 MG tablet       . aspirin EC 81 MG EC tablet Take 1 tablet (81 mg total) by mouth daily.      Marland Kitchen atorvastatin (LIPITOR) 10 MG tablet Take 1 tablet (10 mg total) by mouth daily at 6 PM.  30 tablet  1  . Bioflavonoid Products (ESTER C PO) Take 500 mg by mouth daily.        . cetirizine (ZYRTEC) 10 MG tablet Take 10 mg by mouth daily.        . Cholecalciferol (VITAMIN D-3 PO) Take 1,000 Units by mouth 2 (two) times daily.       Marland Kitchen ezetimibe (ZETIA) 10 MG tablet  Take 1 tablet (10 mg total) by mouth daily.  30 tablet  1  . FERREX 150 150 MG capsule TAKE 1 CAPSULE BY MOUTH DAILY  30 each  0  . fluticasone (VERAMYST) 27.5 MCG/SPRAY nasal spray Place 1 spray into the nose daily. For allergies      . folic acid (FOLVITE) 1 MG tablet Take 1 tablet (1 mg total) by mouth daily.  30 tablet  1  . furosemide (LASIX) 40 MG tablet Take 1 tablet (40 mg total) by mouth daily. For 7 days  7 tablet  0  . metoprolol tartrate (LOPRESSOR) 25 MG tablet Take 25 mg by mouth 3 (three) times daily.      . Misc Natural Products (OSTEO BI-FLEX ADV TRIPLE ST PO) Take 1 tablet by mouth daily.       . montelukast (SINGULAIR) 10 MG tablet Take 10 mg by mouth at bedtime.       . Multiple Vitamin (MULTIVITAMIN WITH MINERALS) TABS Take 1 tablet by mouth daily.      Marland Kitchen  niacin (NIASPAN) 1000 MG CR tablet Take 1,000 mg by mouth at bedtime.        . nitroGLYCERIN (NITROSTAT) 0.4 MG SL tablet Place 0.4 mg under the tongue every 5 (five) minutes as needed. Chest pain      . Omega-3 Fatty Acids (FISH OIL PO) Take 1,000 mg by mouth 2 (two) times daily.        Marland Kitchen omeprazole (PRILOSEC) 20 MG capsule Take 20 mg by mouth daily.       Marland Kitchen warfarin (COUMADIN) 7.5 MG tablet Take 1 tab (7.5 mg) po daily or as directed by the Coumadin Clinic  30 tablet  1       Physical Exam: BP 132/72  Pulse 52  Resp 16  Ht 6\' 2"  (1.88 m)  Wt 265 lb (120.203 kg)  BMI 34.02 kg/m2  SpO2 94%  General appearance: alert, cooperative and no distress Neurologic: intact Heart: regular rate and rhythm, S1, S2 normal, no murmur, click, rub or gallop and normal apical impulse Lungs: clear to auscultation bilaterally and normal percussion bilaterally Abdomen: soft, non-tender; bowel sounds normal; no masses,  no organomegaly Extremities: extremities normal, atraumatic, no cyanosis or edema, Homans sign is negative, no sign of DVT and left leg incision well healed no infection Wound: sternum stable   Diagnostic Studies  & Laboratory data:         Recent Radiology Findings: No results found.    Recent Labs: Lab Results  Component Value Date   WBC 10.5 09/18/2011   HGB 9.8* 09/18/2011   HCT 29.8* 09/18/2011   PLT 202 09/18/2011   GLUCOSE 107* 09/18/2011   ALT 16 09/08/2011   AST 19 09/08/2011   NA 136 09/18/2011   K 3.7 09/18/2011   CL 97 09/18/2011   CREATININE 0.76 09/18/2011   BUN 15 09/18/2011   CO2 29 09/18/2011   INR 1.49 09/21/2011   HGBA1C 6.1* 09/08/2011      Assessment / Plan:       No Afib today, on coumadin for post op afib. Poss d/c in another month if not recurrent afib Doing well post op Cordarone dose now to 200/ day See prn       Kerby Borner B 11/23/2011 12:42 PM

## 2011-11-23 NOTE — Patient Instructions (Signed)
Doing well. Return as needed. 

## 2011-11-24 ENCOUNTER — Encounter (HOSPITAL_COMMUNITY)
Admission: RE | Admit: 2011-11-24 | Discharge: 2011-11-24 | Disposition: A | Discharge status: Home / Self Care | Payer: Medicare Other | Source: Ambulatory Visit | Attending: Cardiovascular Disease | Admitting: Cardiovascular Disease

## 2011-11-24 DIAGNOSIS — Z882 Allergy status to sulfonamides status: Secondary | ICD-10-CM | POA: Insufficient documentation

## 2011-11-24 DIAGNOSIS — I10 Essential (primary) hypertension: Secondary | ICD-10-CM | POA: Insufficient documentation

## 2011-11-24 DIAGNOSIS — Z801 Family history of malignant neoplasm of trachea, bronchus and lung: Secondary | ICD-10-CM | POA: Insufficient documentation

## 2011-11-24 DIAGNOSIS — Z5189 Encounter for other specified aftercare: Secondary | ICD-10-CM | POA: Insufficient documentation

## 2011-11-24 DIAGNOSIS — G4730 Sleep apnea, unspecified: Secondary | ICD-10-CM | POA: Insufficient documentation

## 2011-11-24 DIAGNOSIS — Z823 Family history of stroke: Secondary | ICD-10-CM | POA: Insufficient documentation

## 2011-11-24 DIAGNOSIS — Z9861 Coronary angioplasty status: Secondary | ICD-10-CM | POA: Insufficient documentation

## 2011-11-24 DIAGNOSIS — I4891 Unspecified atrial fibrillation: Secondary | ICD-10-CM | POA: Insufficient documentation

## 2011-11-24 DIAGNOSIS — I2510 Atherosclerotic heart disease of native coronary artery without angina pectoris: Secondary | ICD-10-CM | POA: Insufficient documentation

## 2011-11-24 DIAGNOSIS — I2589 Other forms of chronic ischemic heart disease: Secondary | ICD-10-CM | POA: Insufficient documentation

## 2011-11-24 DIAGNOSIS — Z7901 Long term (current) use of anticoagulants: Secondary | ICD-10-CM | POA: Insufficient documentation

## 2011-11-24 DIAGNOSIS — I2582 Chronic total occlusion of coronary artery: Secondary | ICD-10-CM | POA: Insufficient documentation

## 2011-11-24 DIAGNOSIS — I252 Old myocardial infarction: Secondary | ICD-10-CM | POA: Insufficient documentation

## 2011-11-24 DIAGNOSIS — Z951 Presence of aortocoronary bypass graft: Secondary | ICD-10-CM | POA: Insufficient documentation

## 2011-11-24 DIAGNOSIS — E119 Type 2 diabetes mellitus without complications: Secondary | ICD-10-CM | POA: Insufficient documentation

## 2011-11-24 DIAGNOSIS — Z7982 Long term (current) use of aspirin: Secondary | ICD-10-CM | POA: Insufficient documentation

## 2011-11-24 DIAGNOSIS — K219 Gastro-esophageal reflux disease without esophagitis: Secondary | ICD-10-CM | POA: Insufficient documentation

## 2011-11-24 DIAGNOSIS — Z87891 Personal history of nicotine dependence: Secondary | ICD-10-CM | POA: Insufficient documentation

## 2011-11-24 DIAGNOSIS — Z79899 Other long term (current) drug therapy: Secondary | ICD-10-CM | POA: Insufficient documentation

## 2011-11-24 DIAGNOSIS — E785 Hyperlipidemia, unspecified: Secondary | ICD-10-CM | POA: Insufficient documentation

## 2011-11-27 ENCOUNTER — Encounter (HOSPITAL_COMMUNITY)
Admission: RE | Admit: 2011-11-27 | Discharge: 2011-11-27 | Disposition: A | Discharge status: Home / Self Care | Payer: Medicare Other | Source: Ambulatory Visit | Attending: Cardiovascular Disease | Admitting: Cardiovascular Disease

## 2011-11-27 NOTE — Progress Notes (Signed)
Nicholas Lynn 66 y.o. male Nutrition Note Spoke with pt.  Nutrition Plan, Nutrition Survey, and cholesterol goals reviewed with pt. Pt is following Step 1 of the Therapeutic Lifestyle Changes diet according to pt's MEDFICTS score. Per discussion with pt, pt appears to be following a Step 2 Therapeutic Lifestyle Changes diet. Pt states he gained 10# from fluid and extra calories over the past 4 months. Pt wants to lose wt. Pt has been trying to lose wt by cutting down on portion sizes and eating more. Wt loss tips reviewed.  Pt is pre-diabetic according to pt's last A1c. Pt states he requested that he be checked for DM given his strong family history of DM (mother and father). Pre-diabetes discussed. Pt on Coumadin and is aware of the need to take in a Consistent vitamin K diet. Pt expressed understanding of the above information reviewed. Pt aware of nutrition education classes offered Nutrition Diagnosis   Food-and nutrition-related knowledge deficit related to lack of exposure to information as related to diagnosis of: ? CVD ? Pre-DM (A1c 6.1)    Obesity related to excessive energy intake as evidenced by a BMI of 36.5  Nutrition RX/ Estimated Daily Nutrition Needs for: wt loss  1900-2400 Kcal, 50-65 gm fat, 12-16 gm sat fat, 1.8-2.4 gm trans-fat, <1500 mg sodium  Nutrition Intervention   Pt's individual nutrition plan including cholesterol goals reviewed with pt.   Benefits of adopting Therapeutic Lifestyle Changes discussed when Medficts reviewed.   Pt to attend the Portion Distortion class   Pt to attend the  ? Nutrition I class                     ? Nutrition II class           Pt given handouts for: ? pre-diabetes ? Consistent vit K diet    Continue client-centered nutrition education by RD, as part of interdisciplinary care. Goal(s)   Pt to identify food quantities necessary to achieve: ? wt loss to a goal wt of 248-260 lb (112.8-118.2 kg) at graduation from cardiac rehab.  Monitor and  Evaluate progress toward nutrition goal with team. Nutrition Risk: Change to Moderate

## 2011-11-29 ENCOUNTER — Encounter (HOSPITAL_COMMUNITY)
Admission: RE | Admit: 2011-11-29 | Discharge: 2011-11-29 | Disposition: A | Discharge status: Home / Self Care | Payer: Medicare Other | Source: Ambulatory Visit | Attending: Cardiovascular Disease | Admitting: Cardiovascular Disease

## 2011-12-01 ENCOUNTER — Encounter (HOSPITAL_COMMUNITY)
Admission: RE | Admit: 2011-12-01 | Discharge: 2011-12-01 | Disposition: A | Discharge status: Home / Self Care | Payer: Medicare Other | Source: Ambulatory Visit | Attending: Cardiovascular Disease | Admitting: Cardiovascular Disease

## 2011-12-04 ENCOUNTER — Encounter (HOSPITAL_COMMUNITY)
Admission: RE | Admit: 2011-12-04 | Discharge: 2011-12-04 | Disposition: A | Discharge status: Home / Self Care | Payer: Medicare Other | Source: Ambulatory Visit | Attending: Cardiovascular Disease | Admitting: Cardiovascular Disease

## 2011-12-06 ENCOUNTER — Encounter (HOSPITAL_COMMUNITY)
Admission: RE | Admit: 2011-12-06 | Discharge: 2011-12-06 | Disposition: A | Discharge status: Home / Self Care | Payer: Medicare Other | Source: Ambulatory Visit | Attending: Cardiovascular Disease | Admitting: Cardiovascular Disease

## 2011-12-08 ENCOUNTER — Encounter (HOSPITAL_COMMUNITY)
Admission: RE | Admit: 2011-12-08 | Discharge: 2011-12-08 | Disposition: A | Discharge status: Home / Self Care | Payer: Medicare Other | Source: Ambulatory Visit | Attending: Cardiovascular Disease | Admitting: Cardiovascular Disease

## 2011-12-11 ENCOUNTER — Encounter (HOSPITAL_COMMUNITY)
Admission: RE | Admit: 2011-12-11 | Discharge: 2011-12-11 | Disposition: A | Discharge status: Home / Self Care | Payer: Medicare Other | Source: Ambulatory Visit | Attending: Cardiovascular Disease | Admitting: Cardiovascular Disease

## 2011-12-13 ENCOUNTER — Encounter (HOSPITAL_COMMUNITY)
Admission: RE | Admit: 2011-12-13 | Discharge: 2011-12-13 | Disposition: A | Discharge status: Home / Self Care | Payer: Medicare Other | Source: Ambulatory Visit | Attending: Cardiovascular Disease | Admitting: Cardiovascular Disease

## 2011-12-15 ENCOUNTER — Encounter (HOSPITAL_COMMUNITY)
Admission: RE | Admit: 2011-12-15 | Discharge: 2011-12-15 | Disposition: A | Discharge status: Home / Self Care | Payer: Medicare Other | Source: Ambulatory Visit | Attending: Cardiovascular Disease | Admitting: Cardiovascular Disease

## 2011-12-18 ENCOUNTER — Encounter (HOSPITAL_COMMUNITY)
Admission: RE | Admit: 2011-12-18 | Discharge: 2011-12-18 | Disposition: A | Discharge status: Home / Self Care | Payer: Medicare Other | Source: Ambulatory Visit | Attending: Cardiovascular Disease | Admitting: Cardiovascular Disease

## 2011-12-20 ENCOUNTER — Encounter (HOSPITAL_COMMUNITY)
Admission: RE | Admit: 2011-12-20 | Discharge: 2011-12-20 | Disposition: A | Discharge status: Home / Self Care | Payer: Medicare Other | Source: Ambulatory Visit | Attending: Cardiovascular Disease | Admitting: Cardiovascular Disease

## 2011-12-25 ENCOUNTER — Encounter (HOSPITAL_COMMUNITY)
Admission: RE | Admit: 2011-12-25 | Discharge: 2011-12-25 | Disposition: A | Discharge status: Home / Self Care | Payer: Medicare Other | Source: Ambulatory Visit | Attending: Cardiovascular Disease | Admitting: Cardiovascular Disease

## 2011-12-25 DIAGNOSIS — I252 Old myocardial infarction: Secondary | ICD-10-CM | POA: Insufficient documentation

## 2011-12-25 DIAGNOSIS — E119 Type 2 diabetes mellitus without complications: Secondary | ICD-10-CM | POA: Insufficient documentation

## 2011-12-25 DIAGNOSIS — Z7982 Long term (current) use of aspirin: Secondary | ICD-10-CM | POA: Insufficient documentation

## 2011-12-25 DIAGNOSIS — Z79899 Other long term (current) drug therapy: Secondary | ICD-10-CM | POA: Insufficient documentation

## 2011-12-25 DIAGNOSIS — E785 Hyperlipidemia, unspecified: Secondary | ICD-10-CM | POA: Insufficient documentation

## 2011-12-25 DIAGNOSIS — I10 Essential (primary) hypertension: Secondary | ICD-10-CM | POA: Insufficient documentation

## 2011-12-25 DIAGNOSIS — G4730 Sleep apnea, unspecified: Secondary | ICD-10-CM | POA: Insufficient documentation

## 2011-12-25 DIAGNOSIS — Z87891 Personal history of nicotine dependence: Secondary | ICD-10-CM | POA: Insufficient documentation

## 2011-12-25 DIAGNOSIS — Z882 Allergy status to sulfonamides status: Secondary | ICD-10-CM | POA: Insufficient documentation

## 2011-12-25 DIAGNOSIS — Z823 Family history of stroke: Secondary | ICD-10-CM | POA: Insufficient documentation

## 2011-12-25 DIAGNOSIS — I2582 Chronic total occlusion of coronary artery: Secondary | ICD-10-CM | POA: Insufficient documentation

## 2011-12-25 DIAGNOSIS — Z9861 Coronary angioplasty status: Secondary | ICD-10-CM | POA: Insufficient documentation

## 2011-12-25 DIAGNOSIS — I4891 Unspecified atrial fibrillation: Secondary | ICD-10-CM | POA: Insufficient documentation

## 2011-12-25 DIAGNOSIS — K219 Gastro-esophageal reflux disease without esophagitis: Secondary | ICD-10-CM | POA: Insufficient documentation

## 2011-12-25 DIAGNOSIS — I2510 Atherosclerotic heart disease of native coronary artery without angina pectoris: Secondary | ICD-10-CM | POA: Insufficient documentation

## 2011-12-25 DIAGNOSIS — Z5189 Encounter for other specified aftercare: Secondary | ICD-10-CM | POA: Insufficient documentation

## 2011-12-25 DIAGNOSIS — Z951 Presence of aortocoronary bypass graft: Secondary | ICD-10-CM | POA: Insufficient documentation

## 2011-12-25 DIAGNOSIS — Z801 Family history of malignant neoplasm of trachea, bronchus and lung: Secondary | ICD-10-CM | POA: Insufficient documentation

## 2011-12-25 DIAGNOSIS — Z7901 Long term (current) use of anticoagulants: Secondary | ICD-10-CM | POA: Insufficient documentation

## 2011-12-25 DIAGNOSIS — I2589 Other forms of chronic ischemic heart disease: Secondary | ICD-10-CM | POA: Insufficient documentation

## 2011-12-27 ENCOUNTER — Encounter (HOSPITAL_COMMUNITY)
Admission: RE | Admit: 2011-12-27 | Discharge: 2011-12-27 | Disposition: A | Discharge status: Home / Self Care | Payer: Medicare Other | Source: Ambulatory Visit | Attending: Cardiovascular Disease | Admitting: Cardiovascular Disease

## 2011-12-29 ENCOUNTER — Encounter (HOSPITAL_COMMUNITY)
Admission: RE | Admit: 2011-12-29 | Discharge: 2011-12-29 | Disposition: A | Discharge status: Home / Self Care | Payer: Medicare Other | Source: Ambulatory Visit | Attending: Cardiovascular Disease | Admitting: Cardiovascular Disease

## 2012-01-01 ENCOUNTER — Encounter (HOSPITAL_COMMUNITY): Payer: Medicare Other

## 2012-01-03 ENCOUNTER — Encounter (HOSPITAL_COMMUNITY)
Admission: RE | Admit: 2012-01-03 | Discharge: 2012-01-03 | Disposition: A | Discharge status: Home / Self Care | Payer: Medicare Other | Source: Ambulatory Visit | Attending: Cardiovascular Disease | Admitting: Cardiovascular Disease

## 2012-01-05 ENCOUNTER — Encounter (HOSPITAL_COMMUNITY)
Admission: RE | Admit: 2012-01-05 | Discharge: 2012-01-05 | Disposition: A | Discharge status: Home / Self Care | Payer: Medicare Other | Source: Ambulatory Visit | Attending: Cardiovascular Disease | Admitting: Cardiovascular Disease

## 2012-01-08 ENCOUNTER — Encounter (HOSPITAL_COMMUNITY)
Admission: RE | Admit: 2012-01-08 | Discharge: 2012-01-08 | Disposition: A | Discharge status: Home / Self Care | Payer: Medicare Other | Source: Ambulatory Visit | Attending: Cardiovascular Disease | Admitting: Cardiovascular Disease

## 2012-01-10 ENCOUNTER — Encounter (HOSPITAL_COMMUNITY)
Admission: RE | Admit: 2012-01-10 | Discharge: 2012-01-10 | Disposition: A | Discharge status: Home / Self Care | Payer: Medicare Other | Source: Ambulatory Visit | Attending: Cardiovascular Disease | Admitting: Cardiovascular Disease

## 2012-01-12 ENCOUNTER — Encounter (HOSPITAL_COMMUNITY): Payer: Medicare Other

## 2012-01-12 ENCOUNTER — Telehealth (HOSPITAL_COMMUNITY): Payer: Self-pay | Admitting: Cardiac Rehabilitation

## 2012-01-12 NOTE — Telephone Encounter (Signed)
Message received from pt he will be absent from cardiac rehab today for a viral illness.  Left message on pt answering machine to respond to call

## 2012-01-15 ENCOUNTER — Telehealth (HOSPITAL_COMMUNITY): Payer: Self-pay | Admitting: Cardiac Rehabilitation

## 2012-01-15 ENCOUNTER — Encounter (HOSPITAL_COMMUNITY): Payer: Medicare Other

## 2012-01-15 NOTE — Telephone Encounter (Signed)
Pc to assess for reason continued absence from cardiac rehab.  Pt states he has viral bronchitis with fever today.  Pt has been seen by his PCP and taking medication.  Pt understands to notify MD if sx unimproved or worsen.  Pt advised to avoid rehab until fever resolved at least 24 hours.  Pt verbalized understanding.

## 2012-01-19 ENCOUNTER — Encounter (HOSPITAL_COMMUNITY)
Admission: RE | Admit: 2012-01-19 | Discharge: 2012-01-19 | Disposition: A | Discharge status: Home / Self Care | Payer: Medicare Other | Source: Ambulatory Visit | Attending: Cardiovascular Disease | Admitting: Cardiovascular Disease

## 2012-01-19 NOTE — Progress Notes (Signed)
Nicholas Lynn is wearing a monitor for 14 to days to determine whether he will continue taking his anticoagulant per his cardiologist.

## 2012-01-22 ENCOUNTER — Encounter (HOSPITAL_COMMUNITY)
Admission: RE | Admit: 2012-01-22 | Discharge: 2012-01-22 | Disposition: A | Discharge status: Home / Self Care | Payer: Medicare Other | Source: Ambulatory Visit | Attending: Cardiovascular Disease | Admitting: Cardiovascular Disease

## 2012-01-26 ENCOUNTER — Encounter (HOSPITAL_COMMUNITY)
Admission: RE | Admit: 2012-01-26 | Discharge: 2012-01-26 | Disposition: A | Discharge status: Home / Self Care | Payer: Medicare Other | Source: Ambulatory Visit | Attending: Cardiovascular Disease | Admitting: Cardiovascular Disease

## 2012-01-26 DIAGNOSIS — Z7901 Long term (current) use of anticoagulants: Secondary | ICD-10-CM | POA: Insufficient documentation

## 2012-01-26 DIAGNOSIS — Z87891 Personal history of nicotine dependence: Secondary | ICD-10-CM | POA: Insufficient documentation

## 2012-01-26 DIAGNOSIS — Z79899 Other long term (current) drug therapy: Secondary | ICD-10-CM | POA: Insufficient documentation

## 2012-01-26 DIAGNOSIS — Z5189 Encounter for other specified aftercare: Secondary | ICD-10-CM | POA: Insufficient documentation

## 2012-01-26 DIAGNOSIS — Z9861 Coronary angioplasty status: Secondary | ICD-10-CM | POA: Insufficient documentation

## 2012-01-26 DIAGNOSIS — I2589 Other forms of chronic ischemic heart disease: Secondary | ICD-10-CM | POA: Insufficient documentation

## 2012-01-26 DIAGNOSIS — Z823 Family history of stroke: Secondary | ICD-10-CM | POA: Insufficient documentation

## 2012-01-26 DIAGNOSIS — I2510 Atherosclerotic heart disease of native coronary artery without angina pectoris: Secondary | ICD-10-CM | POA: Insufficient documentation

## 2012-01-26 DIAGNOSIS — Z882 Allergy status to sulfonamides status: Secondary | ICD-10-CM | POA: Insufficient documentation

## 2012-01-26 DIAGNOSIS — E785 Hyperlipidemia, unspecified: Secondary | ICD-10-CM | POA: Insufficient documentation

## 2012-01-26 DIAGNOSIS — G4730 Sleep apnea, unspecified: Secondary | ICD-10-CM | POA: Insufficient documentation

## 2012-01-26 DIAGNOSIS — I10 Essential (primary) hypertension: Secondary | ICD-10-CM | POA: Insufficient documentation

## 2012-01-26 DIAGNOSIS — I2582 Chronic total occlusion of coronary artery: Secondary | ICD-10-CM | POA: Insufficient documentation

## 2012-01-26 DIAGNOSIS — K219 Gastro-esophageal reflux disease without esophagitis: Secondary | ICD-10-CM | POA: Insufficient documentation

## 2012-01-26 DIAGNOSIS — I4891 Unspecified atrial fibrillation: Secondary | ICD-10-CM | POA: Insufficient documentation

## 2012-01-26 DIAGNOSIS — Z951 Presence of aortocoronary bypass graft: Secondary | ICD-10-CM | POA: Insufficient documentation

## 2012-01-26 DIAGNOSIS — Z7982 Long term (current) use of aspirin: Secondary | ICD-10-CM | POA: Insufficient documentation

## 2012-01-26 DIAGNOSIS — E119 Type 2 diabetes mellitus without complications: Secondary | ICD-10-CM | POA: Insufficient documentation

## 2012-01-26 DIAGNOSIS — Z801 Family history of malignant neoplasm of trachea, bronchus and lung: Secondary | ICD-10-CM | POA: Insufficient documentation

## 2012-01-26 DIAGNOSIS — I252 Old myocardial infarction: Secondary | ICD-10-CM | POA: Insufficient documentation

## 2012-01-29 ENCOUNTER — Encounter (HOSPITAL_COMMUNITY)
Admission: RE | Admit: 2012-01-29 | Discharge: 2012-01-29 | Disposition: A | Discharge status: Home / Self Care | Payer: Medicare Other | Source: Ambulatory Visit | Attending: Cardiovascular Disease | Admitting: Cardiovascular Disease

## 2012-01-31 ENCOUNTER — Encounter (HOSPITAL_COMMUNITY)
Admission: RE | Admit: 2012-01-31 | Discharge: 2012-01-31 | Disposition: A | Discharge status: Home / Self Care | Payer: Medicare Other | Source: Ambulatory Visit | Attending: Cardiovascular Disease | Admitting: Cardiovascular Disease

## 2012-02-02 ENCOUNTER — Encounter (HOSPITAL_COMMUNITY): Admission: RE | Admit: 2012-02-02 | Payer: Medicare Other | Source: Ambulatory Visit

## 2012-02-05 ENCOUNTER — Encounter (HOSPITAL_COMMUNITY)
Admission: RE | Admit: 2012-02-05 | Discharge: 2012-02-05 | Disposition: A | Discharge status: Home / Self Care | Payer: Medicare Other | Source: Ambulatory Visit | Attending: Cardiovascular Disease | Admitting: Cardiovascular Disease

## 2012-02-07 ENCOUNTER — Encounter (HOSPITAL_COMMUNITY)
Admission: RE | Admit: 2012-02-07 | Discharge: 2012-02-07 | Disposition: A | Discharge status: Home / Self Care | Payer: Medicare Other | Source: Ambulatory Visit | Attending: Cardiovascular Disease | Admitting: Cardiovascular Disease

## 2012-02-09 ENCOUNTER — Encounter (HOSPITAL_COMMUNITY): Payer: Medicare Other

## 2012-02-09 ENCOUNTER — Encounter (HOSPITAL_COMMUNITY)
Admission: RE | Admit: 2012-02-09 | Discharge: 2012-02-09 | Disposition: A | Discharge status: Home / Self Care | Payer: Medicare Other | Source: Ambulatory Visit | Attending: Cardiovascular Disease | Admitting: Cardiovascular Disease

## 2012-02-12 ENCOUNTER — Encounter (HOSPITAL_COMMUNITY)
Admission: RE | Admit: 2012-02-12 | Discharge: 2012-02-12 | Disposition: A | Discharge status: Home / Self Care | Payer: Medicare Other | Source: Ambulatory Visit | Attending: Cardiovascular Disease | Admitting: Cardiovascular Disease

## 2012-02-14 ENCOUNTER — Encounter (HOSPITAL_COMMUNITY)
Admission: RE | Admit: 2012-02-14 | Discharge: 2012-02-14 | Disposition: A | Discharge status: Home / Self Care | Payer: Medicare Other | Source: Ambulatory Visit | Attending: Cardiovascular Disease | Admitting: Cardiovascular Disease

## 2012-02-16 ENCOUNTER — Encounter (HOSPITAL_COMMUNITY)
Admission: RE | Admit: 2012-02-16 | Discharge: 2012-02-16 | Disposition: A | Discharge status: Home / Self Care | Payer: Medicare Other | Source: Ambulatory Visit | Attending: Cardiovascular Disease | Admitting: Cardiovascular Disease

## 2012-02-19 ENCOUNTER — Encounter (HOSPITAL_COMMUNITY): Payer: Self-pay

## 2012-02-19 ENCOUNTER — Encounter (HOSPITAL_COMMUNITY)
Admission: RE | Admit: 2012-02-19 | Discharge: 2012-02-19 | Disposition: A | Discharge status: Home / Self Care | Payer: Medicare Other | Source: Ambulatory Visit | Attending: Cardiovascular Disease | Admitting: Cardiovascular Disease

## 2012-02-19 NOTE — Progress Notes (Signed)
Pt graduated from cardiac rehab program today. Pt made significant lifestyle modification and should be commended for his success.  Pt plans to exercise on his own at [employer] and [facility].

## 2012-02-21 ENCOUNTER — Encounter (HOSPITAL_COMMUNITY): Payer: Medicare Other

## 2012-02-23 ENCOUNTER — Encounter (HOSPITAL_COMMUNITY): Payer: Medicare Other

## 2012-02-26 ENCOUNTER — Encounter (HOSPITAL_COMMUNITY): Payer: Medicare Other

## 2012-02-28 ENCOUNTER — Encounter (HOSPITAL_COMMUNITY): Payer: Medicare Other

## 2012-03-01 ENCOUNTER — Encounter (HOSPITAL_COMMUNITY): Payer: Medicare Other

## 2012-03-04 ENCOUNTER — Encounter (HOSPITAL_COMMUNITY): Payer: Medicare Other

## 2012-03-06 ENCOUNTER — Encounter (HOSPITAL_COMMUNITY): Payer: Medicare Other

## 2012-03-08 ENCOUNTER — Encounter (HOSPITAL_COMMUNITY): Payer: Medicare Other

## 2012-03-11 ENCOUNTER — Encounter (HOSPITAL_COMMUNITY): Payer: Medicare Other

## 2012-03-13 ENCOUNTER — Encounter (HOSPITAL_COMMUNITY): Payer: Medicare Other

## 2012-03-15 ENCOUNTER — Encounter (HOSPITAL_COMMUNITY): Payer: Medicare Other

## 2012-06-04 ENCOUNTER — Other Ambulatory Visit: Payer: Self-pay | Admitting: *Deleted

## 2012-06-04 MED ORDER — POLYSACCHARIDE IRON COMPLEX 150 MG PO CAPS: ORAL_CAPSULE | ORAL | Status: DC

## 2012-06-04 NOTE — Telephone Encounter (Signed)
Electronic rx sent.

## 2012-06-13 ENCOUNTER — Other Ambulatory Visit: Payer: Self-pay

## 2012-06-13 MED ORDER — FOLIC ACID 1 MG PO TABS: 1.0000 mg | ORAL_TABLET | Freq: Every day | ORAL | Status: DC

## 2012-06-13 NOTE — Telephone Encounter (Signed)
Rx called in to pharmacy. 

## 2012-07-30 ENCOUNTER — Other Ambulatory Visit: Payer: Self-pay | Admitting: Internal Medicine

## 2012-07-30 MED ORDER — METOPROLOL TARTRATE 25 MG PO TABS: 25.0000 mg | ORAL_TABLET | Freq: Three times a day (TID) | ORAL | Status: DC

## 2012-07-30 NOTE — Telephone Encounter (Signed)
Rx was sent to pharmacy electronically via [employer].  

## 2012-07-30 NOTE — Addendum Note (Signed)
Addended by: Neta Ehlers on: 07/30/2012 12:30 PM   Modules accepted: Orders

## 2012-09-13 ENCOUNTER — Other Ambulatory Visit: Payer: Self-pay | Admitting: *Deleted

## 2012-09-13 MED ORDER — NITROGLYCERIN 0.4 MG SL SUBL: 0.4000 mg | SUBLINGUAL_TABLET | SUBLINGUAL | Status: DC | PRN

## 2012-09-13 NOTE — Telephone Encounter (Signed)
Rx was sent to pharmacy electronically via [employer] 

## 2012-10-22 ENCOUNTER — Other Ambulatory Visit: Payer: Self-pay | Admitting: Internal Medicine

## 2012-12-22 ENCOUNTER — Inpatient Hospital Stay (HOSPITAL_COMMUNITY)
Admission: EM | Admit: 2012-12-22 | Discharge: 2012-12-25 | DRG: 310 | Disposition: A | Discharge status: Home / Self Care | Payer: Medicare Other | Attending: Cardiology | Admitting: Cardiology

## 2012-12-22 ENCOUNTER — Encounter (HOSPITAL_COMMUNITY): Payer: Self-pay | Admitting: Emergency Medicine

## 2012-12-22 DIAGNOSIS — Z79899 Other long term (current) drug therapy: Secondary | ICD-10-CM

## 2012-12-22 DIAGNOSIS — E785 Hyperlipidemia, unspecified: Secondary | ICD-10-CM

## 2012-12-22 DIAGNOSIS — I10 Essential (primary) hypertension: Secondary | ICD-10-CM | POA: Diagnosis present

## 2012-12-22 DIAGNOSIS — Z6834 Body mass index (BMI) 34.0-34.9, adult: Secondary | ICD-10-CM

## 2012-12-22 DIAGNOSIS — Z9849 Cataract extraction status, unspecified eye: Secondary | ICD-10-CM

## 2012-12-22 DIAGNOSIS — I255 Ischemic cardiomyopathy: Secondary | ICD-10-CM

## 2012-12-22 DIAGNOSIS — Z87891 Personal history of nicotine dependence: Secondary | ICD-10-CM

## 2012-12-22 DIAGNOSIS — Z882 Allergy status to sulfonamides status: Secondary | ICD-10-CM

## 2012-12-22 DIAGNOSIS — Z7982 Long term (current) use of aspirin: Secondary | ICD-10-CM

## 2012-12-22 DIAGNOSIS — K219 Gastro-esophageal reflux disease without esophagitis: Secondary | ICD-10-CM | POA: Diagnosis present

## 2012-12-22 DIAGNOSIS — I480 Paroxysmal atrial fibrillation: Secondary | ICD-10-CM | POA: Diagnosis present

## 2012-12-22 DIAGNOSIS — I4729 Other ventricular tachycardia: Secondary | ICD-10-CM | POA: Diagnosis not present

## 2012-12-22 DIAGNOSIS — Z7901 Long term (current) use of anticoagulants: Secondary | ICD-10-CM

## 2012-12-22 DIAGNOSIS — I4891 Unspecified atrial fibrillation: Secondary | ICD-10-CM

## 2012-12-22 DIAGNOSIS — Z823 Family history of stroke: Secondary | ICD-10-CM

## 2012-12-22 DIAGNOSIS — I472 Ventricular tachycardia, unspecified: Secondary | ICD-10-CM | POA: Diagnosis not present

## 2012-12-22 DIAGNOSIS — I252 Old myocardial infarction: Secondary | ICD-10-CM

## 2012-12-22 DIAGNOSIS — Z9861 Coronary angioplasty status: Secondary | ICD-10-CM

## 2012-12-22 DIAGNOSIS — Z951 Presence of aortocoronary bypass graft: Secondary | ICD-10-CM

## 2012-12-22 DIAGNOSIS — G4733 Obstructive sleep apnea (adult) (pediatric): Secondary | ICD-10-CM | POA: Diagnosis present

## 2012-12-22 DIAGNOSIS — Z801 Family history of malignant neoplasm of trachea, bronchus and lung: Secondary | ICD-10-CM

## 2012-12-22 DIAGNOSIS — I2510 Atherosclerotic heart disease of native coronary artery without angina pectoris: Secondary | ICD-10-CM

## 2012-12-22 DIAGNOSIS — G4730 Sleep apnea, unspecified: Secondary | ICD-10-CM | POA: Diagnosis present

## 2012-12-22 DIAGNOSIS — E669 Obesity, unspecified: Secondary | ICD-10-CM | POA: Diagnosis present

## 2012-12-22 HISTORY — DX: Old myocardial infarction: I25.2

## 2012-12-22 HISTORY — DX: Dependence on other enabling machines and devices: Z99.89

## 2012-12-22 HISTORY — DX: Obstructive sleep apnea (adult) (pediatric): G47.33

## 2012-12-22 HISTORY — DX: Chronic obstructive pulmonary disease, unspecified: J44.9

## 2012-12-22 LAB — CBC
HCT: 45.3 % (ref 39.0–52.0)
MCV: 88.5 fL (ref 78.0–100.0)
RBC: 5.12 MIL/uL (ref 4.22–5.81)
WBC: 9.7 10*3/uL (ref 4.0–10.5)

## 2012-12-22 LAB — BASIC METABOLIC PANEL
BUN: 13 mg/dL (ref 6–23)
CO2: 26 mEq/L (ref 19–32)
Chloride: 102 mEq/L (ref 96–112)
Creatinine, Ser: 0.83 mg/dL (ref 0.50–1.35)

## 2012-12-22 LAB — POCT I-STAT TROPONIN I: Troponin i, poc: 0.01 ng/mL (ref 0.00–0.08)

## 2012-12-22 LAB — PRO B NATRIURETIC PEPTIDE: Pro B Natriuretic peptide (BNP): 1422 pg/mL — ABNORMAL HIGH (ref 0–125)

## 2012-12-22 MED ORDER — METOPROLOL TARTRATE 1 MG/ML IV SOLN
5.0000 mg | Freq: Once | INTRAVENOUS | Status: AC
  Administered 2012-12-22: 5 mg via INTRAVENOUS
  Filled 2012-12-22: qty 5

## 2012-12-22 MED ORDER — DILTIAZEM HCL 25 MG/5ML IV SOLN
25.0000 mg | Freq: Once | INTRAVENOUS | Status: AC
  Administered 2012-12-22: 22.5 mg via INTRAVENOUS
  Filled 2012-12-22: qty 5

## 2012-12-22 MED ORDER — DILTIAZEM HCL 100 MG IV SOLR: 5.0000 mg/h | INTRAVENOUS | Status: DC

## 2012-12-22 MED ORDER — DILTIAZEM LOAD VIA INFUSION
30.0000 mg | Freq: Once | INTRAVENOUS | Status: DC
  Filled 2012-12-22: qty 30

## 2012-12-22 NOTE — ED Notes (Signed)
Cardiology at bedside.

## 2012-12-22 NOTE — ED Notes (Signed)
Pt states he was resting this afternoon and "Suddenly my heart started skipping around." denies pain. Reports mild SOB.

## 2012-12-22 NOTE — H&P (Signed)
History and Physical   Admit date: 12/22/2012 Name:  Nicholas Lynn Medical record number: [id number] DOB/Age:  February 06, 1945  67 y.o. male  Referring Physician:   [facility]  Primary Cardiologist:  [facility]  Primary Physician: Dr. Asencion Partridge  Chief complaint/reason for admission: Palpitations and atrial fibrillation  HPI:  This 67 year old male has a history of coronary artery disease with previous anterior infarction in 1988 treated with angioplasty. He had bypass grafting in 1996 and then redo bypass grafting in August of 2013. His grafts are detailed in the problem list. He had done well since bypass did have atrial fibrillation was on warfarin until January of this year as well as amiodarone. In January both amiodarone and warfarin were discontinued. He completed rehabilitation and had felt well without recurrence of arrhythmia. He was in his usual state of health but around 5 PM this evening after getting up from the bathroom noted the onset of rapid palpitations and irregular heartbeat. He came to the emergency room where he was found to be in rapid atrial fibrillation. He was given metoprolol as well as diltiazem with some slowing of his heart rate remains in atrial fibrillation. He has not had any angina and denies symptoms of heart failure. He has no PND, orthopnea or significant edema. He has no significant claudication.    Past Medical History  Diagnosis Date  . Hyperlipidemia   . Hypertension   . Hernia     umbilical  . GERD (gastroesophageal reflux disease)   . Arthritis   . Coronary artery disease   . Heart attack 1988  . Sleep apnea     USES C-PAP       Past Surgical History  Procedure Laterality Date  . Angioplasty    . Cardiac catheterization    . Cataract extraction    . Umbilical hernia repair  04/04/2011    Procedure: HERNIA REPAIR UMBILICAL ADULT;  Surgeon: Adolph Pollack, MD;  Location: [facility] ORS;  Service: General;  Laterality:  N/A;  Umbilical Hernia Repair  . Tee without cardioversion  09/11/2011    Procedure: TRANSESOPHAGEAL ECHOCARDIOGRAM (TEE);  Surgeon: Delight Ovens, MD;  Location[facility] OR;  Service: Open Heart Surgery;  Laterality: N/A;  . Coronary artery bypass graft  1996    3 VESSELS  . Coronary artery bypass graft  09/11/2011    Procedure: REDO CORONARY ARTERY BYPASS GRAFTING (CABG);  Surgeon: Delight Ovens, MD;  Location: [facility] OR;  Service: Open Heart Surgery;  Laterality: N/A;  Redo Coronary Artery Bypass Graft times three utilizing the right internal mammary artery and the left  and right greater saphenous veins.  .  Allergies: is allergic to sulfa antibiotics.   Medications: Prior to Admission medications   Medication Sig Start Date End Date Taking? Authorizing Provider  aspirin EC 81 MG tablet Take 81 mg by mouth daily.   Yes Historical Provider, MD  atorvastatin (LIPITOR) 10 MG tablet Take 10 mg by mouth daily.   Yes Historical Provider, MD  Bioflavonoid Products (ESTER C PO) Take 500 mg by mouth daily.     Yes Historical Provider, MD  cetirizine (ZYRTEC) 10 MG tablet Take 10 mg by mouth daily.     Yes Historical Provider, MD  Cholecalciferol (VITAMIN D-3 PO) Take 1,000 Units by mouth 2 (two) times daily.    Yes Historical Provider, MD  ezetimibe (ZETIA) 10 MG tablet Take 10 mg by mouth daily.   Yes Historical Provider, MD  fluticasone (  VERAMYST) 27.5 MCG/SPRAY nasal spray Place 1 spray into the nose daily. For allergies   Yes Historical Provider, MD  folic acid (FOLVITE) 1 MG tablet Take 1 mg by mouth daily.   Yes Historical Provider, MD  iron polysaccharides (NIFEREX) 150 MG capsule Take 150 mg by mouth daily.   Yes Historical Provider, MD  losartan (COZAAR) 50 MG tablet Take 50 mg by mouth daily.   Yes Historical Provider, MD  metoprolol tartrate (LOPRESSOR) 25 MG tablet Take 25 mg by mouth 2 (two) times daily.   Yes Historical Provider, MD  Misc Natural Products (OSTEO BI-FLEX ADV TRIPLE ST  PO) Take 1 tablet by mouth daily.    Yes Historical Provider, MD  montelukast (SINGULAIR) 10 MG tablet Take 10 mg by mouth at bedtime.    Yes Historical Provider, MD  Multiple Vitamin (MULTIVITAMIN WITH MINERALS) TABS Take 1 tablet by mouth daily.   Yes Historical Provider, MD  niacin (NIASPAN) 1000 MG CR tablet Take 1,000 mg by mouth at bedtime.     Yes Historical Provider, MD  nitroGLYCERIN (NITROSTAT) 0.4 MG SL tablet Place 0.4 mg under the tongue every 5 (five) minutes as needed for chest pain.   Yes Historical Provider, MD  Omega-3 Fatty Acids (FISH OIL PO) Take 1,000 mg by mouth 2 (two) times daily.     Yes Historical Provider, MD  omeprazole (PRILOSEC) 20 MG capsule Take 20 mg by mouth daily.    Yes Historical Provider, MD    Family History:  Family Status  Relation Status Death Age  . Mother Deceased 62    CVA  . Father Deceased 60    lung cancer  . Sister Deceased 22    lung cancer  . Brother Deceased 58    cirrhosis  . Brother Deceased 63    lung cancer    Social History:   reports that he quit smoking about 26 years ago. He has never used smokeless tobacco. He reports that he does not drink alcohol or use illicit drugs.   History   Social History Narrative   Retired Psychologist, occupational     Review of Systems: He has been obese and unable to lose weight. He wears reading glasses. He has a known hiatal hernia and reflux. He has moderate nocturia. Arthritis involving his hands. He has no history of bleeding.  Other than as noted above, the remainder of the review of systems is normal  Physical Exam: BP 106/60  Pulse 64  Temp(Src) 98 F (36.7 C) (Oral)  Resp 19  Ht 6\' 2"  (1.88 m)  Wt 121.11 kg (267 lb)  BMI 34.27 kg/m2  SpO2 94% General appearance: Pleasant obese white male who is currently in no acute distress Head: Normocephalic, without obvious abnormality, atraumatic, Balding male hair pattern Skin: Lipoma along posterior back under scapula on the left  Eyes:  conjunctivae/corneas clear. PERRL, EOM's intact. Fundi not examined  Neck: no adenopathy, no carotid bruit, no JVD and supple, symmetrical, trachea midline Lungs: clear to auscultation bilaterally, healed median sternotomy scar  Heart: regular rate and rhythm, S1, S2 normal, no murmur, click, rub or gallop Abdomen: soft, non-tender; bowel sounds normal; no masses,  no organomegaly Rectal: deferred Extremities: extremities normal, atraumatic, no cyanosis or edema and Previous saphenous harvesting scars noted in right lower extremity and left upper leg  Pulses: 2+ and symmetric Neurologic: Grossly normal   Labs: CBC  Recent Labs  12/22/12 1815  WBC 9.7  RBC 5.12  HGB 15.6  HCT 45.3  PLT 166  MCV 88.5  MCH 30.5  MCHC 34.4  RDW 13.6   CMP   Recent Labs  12/22/12 1815  NA 137  K 3.8  CL 102  CO2 26  GLUCOSE 140*  BUN 13  CREATININE 0.83  CALCIUM 9.2  GFRNONAA 88*  GFRAA >90   BNP (last 3 results)  Recent Labs  12/22/12 1815  PROBNP 1422.0*   Cardiac Panel (last 3 results) Troponin (Point of Care Test)  Recent Labs  12/22/12 1829  TROPIPOC 0.01   BNP (last 3 results)  Recent Labs  12/22/12 1815  PROBNP 1422.0*       EKG: Atrial fibrillation with controlled ventricular response, old anteroseptal infarction   IMPRESSIONS: 1. Recent onset of atrial fibrillation with rapid response, duration less than 24 hours by history. Prior history of atrial fibrillation the time of surgery.  2. Coronary artery disease with previous redo bypass grafting in August of 2013 3. Hyperlipidemia under treatment 4. Hypertension  5. Sleep apnea 6. Obesity  PLAN: Initiate anticoagulation with a novel oral anticoagulant. Begin amiodarone to see if he will convert spontaneously. Keep n.p.o. after midnight for possible cardioversion tomorrow.  Signed: Darden Palmer MD [facility]  12/22/2012, 11:43 PM

## 2012-12-22 NOTE — ED Provider Notes (Signed)
I saw and evaluated the patient, reviewed the resident's note and I agree with the findings and plan.  EKG Interpretation    Date/Time:  Sunday December 22 2012 17:51:53 EST Ventricular Rate:  133 PR Interval:    QRS Duration: 92 QT Interval:  336 QTC Calculation: 500 R Axis:   -53 Text Interpretation:  Atrial fibrillation with RVR Left anterior fascicular block Premature atrial complexes Anteroseptal infarct , age undetermined Abnormal ECG Confirmed by HORTON  MD, COURTNEY ([id number]) on 12/22/2012 6:04:12 PM          This is a 67 year old male with a history of atrial fibrillation and CABG who presents with palpitations.  Patient reports onset of symptoms 2 hours prior to arrival. He denies any chest pain or shortness of breath. He has a history of atrial fibrillation following his recent CABG last year. He is nontoxic-appearing and vital signs are notable for heart rate of 130s.  Patient was given IV metoprolol.  Patient continues to have rates in the 120s. Patient was given diltiazem with improvement of his rates into the 60s and 70s. He is currently asymptomatic. Patient was discussed with cardiology who will omit the patient for cardioversion and anticoagulation.  Shon Baton, MD 12/23/12 [phone]

## 2012-12-22 NOTE — ED Provider Notes (Signed)
CSN: [id number]     Arrival date & time 12/22/12  1745 History   First MD Initiated Contact with Patient 12/22/12 1758     Chief Complaint  Patient presents with  . Irregular Heart Beat   (Consider location/radiation/quality/duration/timing/severity/associated sxs/prior Treatment) HPI Chief complaint: Irregular heartbeat history provided by patient  Patient is a 67 year old male past medical history significant for CAD status Nicholas Lynn CABG who comes today with a regular heart beat. Patient states 50 minutes prior to arrival he was sitting in his chair when he felt abrupt onset of palpitations. He checked his pulse and noted pulse rate to be very fast and irregular. Patient denies having A. fib at baseline. States he had this after his CABG but had not had recently. Patient states has been constant for 50 minutes. It is causing only mild shortness of breath with no pain. Symptoms mild in severity. Constant. Unchanged. Patient is not on blood thinners. No treatment attempted.  Past Medical History  Diagnosis Date  . Hyperlipidemia   . Hypertension   . Hernia     umbilical  . GERD (gastroesophageal reflux disease)   . Arthritis   . Coronary artery disease   . Heart attack 1988  . Sleep apnea     USES C-PAP   Past Surgical History  Procedure Laterality Date  . Angioplasty    . Cardiac catheterization    . Cataract extraction    . Umbilical hernia repair  04/04/2011    Procedure: HERNIA REPAIR UMBILICAL ADULT;  Surgeon: Adolph Pollack, MD;  Location: [facility]S;  Service: General;  Laterality: N/A;  Umbilical Hernia Repair  . Tee without cardioversion  09/11/2011    Procedure: TRANSESOPHAGEAL ECHOCARDIOGRAM (TEE);  Surgeon: Delight Ovens, MD;  Location: [facility] OR;  Service: Open Heart Surgery;  Laterality: N/A;  . Coronary artery bypass graft  1996    3 VESSELS  . Coronary artery bypass graft  09/11/2011    Procedure: REDO CORONARY ARTERY BYPASS GRAFTING (CABG);  Surgeon: Delight Ovens,  MD;  Location: [facility] OR;  Service: Open Heart Surgery;  Laterality: N/A;  Redo Coronary Artery Bypass Graft times three utilizing the right internal mammary artery and the left  and right greater saphenous veins.   Family History  Problem Relation Age of Onset  . Stroke Mother   . Cancer Father     lung  . Cancer Sister     lung  . Cancer Sister     liver   History  Substance Use Topics  . Smoking status: Former Smoker    Quit date: 03/27/1986  . Smokeless tobacco: Never Used  . Alcohol Use: No    Review of Systems  Constitutional: Negative for fatigue.  Respiratory: Positive for shortness of breath.   Cardiovascular: Positive for palpitations. Negative for chest pain.  Gastrointestinal: Negative for abdominal pain.  Genitourinary: Negative for dysuria.  Musculoskeletal: Negative for back pain.  Skin: Negative for rash.  Neurological: Negative for headaches.  Psychiatric/Behavioral: Negative for agitation.  All other systems reviewed and are negative.    Allergies  Sulfa antibiotics  Home Medications   Current Outpatient Rx  Name  Route  Sig  Dispense  Refill  . aspirin EC 81 MG tablet   Oral   Take 81 mg by mouth daily.         Marland Kitchen atorvastatin (LIPITOR) 10 MG tablet   Oral   Take 10 mg by mouth daily.         Marland Kitchen  Bioflavonoid Products (ESTER C PO)   Oral   Take 500 mg by mouth daily.           . cetirizine (ZYRTEC) 10 MG tablet   Oral   Take 10 mg by mouth daily.           . Cholecalciferol (VITAMIN D-3 PO)   Oral   Take 1,000 Units by mouth 2 (two) times daily.          Marland Kitchen ezetimibe (ZETIA) 10 MG tablet   Oral   Take 10 mg by mouth daily.         . fluticasone (VERAMYST) 27.5 MCG/SPRAY nasal spray   Nasal   Place 1 spray into the nose daily. For allergies         . folic acid (FOLVITE) 1 MG tablet   Oral   Take 1 mg by mouth daily.         . iron polysaccharides (NIFEREX) 150 MG capsule   Oral   Take 150 mg by mouth daily.          Marland Kitchen losartan (COZAAR) 50 MG tablet   Oral   Take 50 mg by mouth daily.         . metoprolol tartrate (LOPRESSOR) 25 MG tablet   Oral   Take 25 mg by mouth 2 (two) times daily.         . Misc Natural Products (OSTEO BI-FLEX ADV TRIPLE ST PO)   Oral   Take 1 tablet by mouth daily.          . montelukast (SINGULAIR) 10 MG tablet   Oral   Take 10 mg by mouth at bedtime.          . Multiple Vitamin (MULTIVITAMIN WITH MINERALS) TABS   Oral   Take 1 tablet by mouth daily.         . niacin (NIASPAN) 1000 MG CR tablet   Oral   Take 1,000 mg by mouth at bedtime.           . nitroGLYCERIN (NITROSTAT) 0.4 MG SL tablet   Sublingual   Place 0.4 mg under the tongue every 5 (five) minutes as needed for chest pain.         . Omega-3 Fatty Acids (FISH OIL PO)   Oral   Take 1,000 mg by mouth 2 (two) times daily.           Marland Kitchen omeprazole (PRILOSEC) 20 MG capsule   Oral   Take 20 mg by mouth daily.           BP 128/69  Pulse 64  Temp(Src) 98 F (36.7 C) (Oral)  Resp 20  Ht 6\' 2"  (1.88 m)  Wt 267 lb (121.11 kg)  BMI 34.27 kg/m2  SpO2 98% Physical Exam  Nursing note and vitals reviewed. Constitutional: He is oriented to person, place, and time. He appears well-developed and well-nourished.  HENT:  Head: Normocephalic and atraumatic.  Eyes: EOM are normal. Pupils are equal, round, and reactive to light.  Neck: Normal range of motion.  Cardiovascular: Intact distal pulses.   Irregularly irregular, tachycardic  Pulmonary/Chest: Effort normal and breath sounds normal. No respiratory distress.  Subjective shortness of breath.  Abdominal: Soft. He exhibits no distension. There is no tenderness.  Musculoskeletal: Normal range of motion. He exhibits no edema.  Neurological: He is alert and oriented to person, place, and time. No cranial nerve deficit. He exhibits normal muscle tone. Coordination normal.  Skin: Skin is warm and dry. No rash noted.  Psychiatric: He has  a normal mood and affect. His behavior is normal. Judgment and thought content normal.    ED Course  Procedures (including critical care time) Labs Review Labs Reviewed  BASIC METABOLIC PANEL - Abnormal; Notable for the following:    Glucose, Bld 140 (*)    GFR calc non Af Amer 88 (*)    All other components within normal limits  PRO B NATRIURETIC PEPTIDE - Abnormal; Notable for the following:    Pro B Natriuretic peptide (BNP) 1422.0 (*)    All other components within normal limits  CBC  PROTIME-INR  POCT I-STAT TROPONIN I   Imaging Review No results found.  EKG Interpretation    Date/Time:  Sunday December 22 2012 17:51:53 EST Ventricular Rate:  133 PR Interval:    QRS Duration: 92 QT Interval:  336 QTC Calculation: 500 R Axis:   -53 Text Interpretation:  Atrial fibrillation with RVR Left anterior fascicular block Premature atrial complexes Anteroseptal infarct , age undetermined Abnormal ECG Confirmed by HORTON  MD, COURTNEY ([id number]) on 12/22/2012 6:04:12 PM            MDM   1. Atrial fibrillation     On arrival patient to be in A. fib RVR without complaints of chest pain or shortness of breath. Patient not on anticoagulation. Previously had been in A. fib postop. However no acute history of atrial fibrillation. Patient's EKG findings as noted above. Initial troponin negative. CBC/BMP unremarkable. Initial temperature rate control with metoprolol boluses. Heart rate corrected from 140s on arrival to 100-120. Diltiazem wasn't attempted. 20 mg given the patient heart rate controlled in the 60s and 70s. Patient continues to hemodynamically stable with no complaints of chest pain shortness of breath or other concerning findings. Cardiology consult. Will admit for possible cardioversion in a.m. Patient may stable the emergency department continued been rate controlled A. fib no further acute issues.   Patient discussed with attending Dr. Wilkie Aye.      Bridgett Larsson,  MD 12/23/12 [phone]

## 2012-12-22 NOTE — ED Notes (Signed)
This RN received report and was informed the metoprolol had not been adm when ordered at 1645. This RN discussed this matter with Dr. Adriana Simas and informed him that it had not been adm, MD informed this RN to give medication STAT.

## 2012-12-23 ENCOUNTER — Encounter (HOSPITAL_COMMUNITY): Payer: Self-pay | Admitting: *Deleted

## 2012-12-23 ENCOUNTER — Observation Stay (HOSPITAL_COMMUNITY): Payer: Medicare Other

## 2012-12-23 ENCOUNTER — Other Ambulatory Visit: Payer: Self-pay | Admitting: *Deleted

## 2012-12-23 DIAGNOSIS — E785 Hyperlipidemia, unspecified: Secondary | ICD-10-CM

## 2012-12-23 DIAGNOSIS — I480 Paroxysmal atrial fibrillation: Secondary | ICD-10-CM | POA: Diagnosis present

## 2012-12-23 DIAGNOSIS — I517 Cardiomegaly: Secondary | ICD-10-CM

## 2012-12-23 DIAGNOSIS — I4891 Unspecified atrial fibrillation: Principal | ICD-10-CM

## 2012-12-23 DIAGNOSIS — I2589 Other forms of chronic ischemic heart disease: Secondary | ICD-10-CM

## 2012-12-23 DIAGNOSIS — I10 Essential (primary) hypertension: Secondary | ICD-10-CM

## 2012-12-23 DIAGNOSIS — I2510 Atherosclerotic heart disease of native coronary artery without angina pectoris: Secondary | ICD-10-CM

## 2012-12-23 LAB — TROPONIN I
Troponin I: <0.3 ng/mL (ref <0.30)
Troponin I: <0.3 ng/mL (ref <0.30)
Troponin I: <0.3 ng/mL (ref <0.30)

## 2012-12-23 LAB — HEPATIC FUNCTION PANEL
ALT: 20 U/L (ref 0–53)
AST: 20 U/L (ref 0–37)
Alkaline Phosphatase: 65 U/L (ref 39–117)
Bilirubin, Direct: 0.2 mg/dL (ref 0.0–0.3)
Indirect Bilirubin: 0.9 mg/dL (ref 0.3–0.9)
Total Bilirubin: 1.1 mg/dL (ref 0.3–1.2)

## 2012-12-23 LAB — CK TOTAL AND CKMB (NOT AT ARMC)
CK, MB: 3.6 ng/mL (ref 0.3–4.0)
Relative Index: INVALID (ref 0.0–2.5)

## 2012-12-23 MED ORDER — ATORVASTATIN CALCIUM 10 MG PO TABS
10.0000 mg | ORAL_TABLET | Freq: Every day | ORAL | Status: DC
  Administered 2012-12-23 – 2012-12-25 (×3): 10 mg via ORAL
  Filled 2012-12-23 (×3): qty 1

## 2012-12-23 MED ORDER — ONDANSETRON HCL 4 MG/2ML IJ SOLN: 4.0000 mg | Freq: Four times a day (QID) | INTRAMUSCULAR | Status: DC | PRN

## 2012-12-23 MED ORDER — LOSARTAN POTASSIUM 50 MG PO TABS: 50.0000 mg | ORAL_TABLET | Freq: Every day | ORAL | Status: DC

## 2012-12-23 MED ORDER — NITROGLYCERIN 0.4 MG SL SUBL: 0.4000 mg | SUBLINGUAL_TABLET | SUBLINGUAL | Status: DC | PRN

## 2012-12-23 MED ORDER — ADULT MULTIVITAMIN W/MINERALS CH
1.0000 | ORAL_TABLET | Freq: Every day | ORAL | Status: DC
  Administered 2012-12-23 – 2012-12-25 (×3): 1 via ORAL
  Filled 2012-12-23 (×3): qty 1

## 2012-12-23 MED ORDER — FLUTICASONE FUROATE 27.5 MCG/SPRAY NA SUSP: 1.0000 | Freq: Every day | NASAL | Status: DC

## 2012-12-23 MED ORDER — FLUTICASONE PROPIONATE 50 MCG/ACT NA SUSP
1.0000 | Freq: Every day | NASAL | Status: DC
  Administered 2012-12-23 – 2012-12-24 (×2): 1 via NASAL
  Filled 2012-12-23 (×2): qty 16

## 2012-12-23 MED ORDER — NIACIN ER (ANTIHYPERLIPIDEMIC) 1000 MG PO TBCR: 1000.0000 mg | EXTENDED_RELEASE_TABLET | Freq: Every day | ORAL | Status: DC

## 2012-12-23 MED ORDER — APIXABAN 5 MG PO TABS
5.0000 mg | ORAL_TABLET | Freq: Two times a day (BID) | ORAL | Status: DC
  Administered 2012-12-23 – 2012-12-24 (×5): 5 mg via ORAL
  Filled 2012-12-23 (×7): qty 1

## 2012-12-23 MED ORDER — LOSARTAN POTASSIUM 50 MG PO TABS
50.0000 mg | ORAL_TABLET | Freq: Every day | ORAL | Status: DC
  Administered 2012-12-23 – 2012-12-25 (×3): 50 mg via ORAL
  Filled 2012-12-23 (×3): qty 1

## 2012-12-23 MED ORDER — FOLIC ACID 1 MG PO TABS
1.0000 mg | ORAL_TABLET | Freq: Every day | ORAL | Status: DC
  Administered 2012-12-23 – 2012-12-25 (×3): 1 mg via ORAL
  Filled 2012-12-23 (×3): qty 1

## 2012-12-23 MED ORDER — MONTELUKAST SODIUM 10 MG PO TABS
10.0000 mg | ORAL_TABLET | Freq: Every day | ORAL | Status: DC
  Administered 2012-12-23 – 2012-12-24 (×3): 10 mg via ORAL
  Filled 2012-12-23 (×4): qty 1

## 2012-12-23 MED ORDER — MONTELUKAST SODIUM 10 MG PO TABS: 10.0000 mg | ORAL_TABLET | Freq: Every day | ORAL | Status: DC

## 2012-12-23 MED ORDER — AMIODARONE HCL 200 MG PO TABS
400.0000 mg | ORAL_TABLET | Freq: Two times a day (BID) | ORAL | Status: DC
  Administered 2012-12-23 – 2012-12-24 (×5): 400 mg via ORAL
  Filled 2012-12-23 (×7): qty 2

## 2012-12-23 MED ORDER — METOPROLOL TARTRATE 25 MG PO TABS: 25.0000 mg | ORAL_TABLET | Freq: Two times a day (BID) | ORAL | Status: DC

## 2012-12-23 MED ORDER — OMEGA-3-ACID ETHYL ESTERS 1 G PO CAPS
1.0000 g | ORAL_CAPSULE | Freq: Two times a day (BID) | ORAL | Status: DC
  Administered 2012-12-23 – 2012-12-24 (×4): 1 g via ORAL
  Filled 2012-12-23 (×6): qty 1

## 2012-12-23 MED ORDER — METOPROLOL TARTRATE 25 MG PO TABS
25.0000 mg | ORAL_TABLET | Freq: Two times a day (BID) | ORAL | Status: DC
  Administered 2012-12-23 – 2012-12-24 (×4): 25 mg via ORAL
  Filled 2012-12-23 (×5): qty 1

## 2012-12-23 MED ORDER — EZETIMIBE 10 MG PO TABS
10.0000 mg | ORAL_TABLET | Freq: Every day | ORAL | Status: DC
  Administered 2012-12-23 – 2012-12-25 (×3): 10 mg via ORAL
  Filled 2012-12-23 (×3): qty 1

## 2012-12-23 MED ORDER — ACETAMINOPHEN 325 MG PO TABS: 650.0000 mg | ORAL_TABLET | ORAL | Status: DC | PRN

## 2012-12-23 MED ORDER — POLYSACCHARIDE IRON COMPLEX 150 MG PO CAPS
150.0000 mg | ORAL_CAPSULE | Freq: Every day | ORAL | Status: DC
  Administered 2012-12-23 – 2012-12-25 (×3): 150 mg via ORAL
  Filled 2012-12-23 (×3): qty 1

## 2012-12-23 MED ORDER — NIACIN ER 500 MG PO CPCR
1000.0000 mg | ORAL_CAPSULE | Freq: Every day | ORAL | Status: DC
  Administered 2012-12-23 – 2012-12-24 (×3): 1000 mg via ORAL
  Filled 2012-12-23 (×5): qty 2

## 2012-12-23 MED ORDER — PANTOPRAZOLE SODIUM 40 MG PO TBEC
40.0000 mg | DELAYED_RELEASE_TABLET | Freq: Every day | ORAL | Status: DC
  Administered 2012-12-23 – 2012-12-24 (×2): 40 mg via ORAL
  Filled 2012-12-23 (×2): qty 1

## 2012-12-23 NOTE — Care Management Note (Signed)
    Page 1 of 1   12/26/2012     11:17:45 AM   CARE MANAGEMENT NOTE 12/26/2012  Patient:  Nicholas Lynn, Nicholas Lynn   Account Number:  [id number]  Date Initiated:  12/23/2012  Documentation initiated by:  Abhiraj Dozal  Subjective/Objective Assessment:   PT ADM ON 12/22/12 WITH AFIB WITH RVR.  PTA, PT INDEPENDENT, LIVES WITH SPOUSE.     Action/Plan:   WILL FOLLOW FOR DISCHARGE NEEDS AS PT PROGRESSES.   Anticipated DC Date:  12/24/2012   Anticipated DC Plan:  HOME/SELF CARE      DC Planning Services  CM consult      Choice offered to / List presented to:             Status of service:  In process, will continue to follow Medicare Important Message given?   (If response is "NO", the following Medicare IM given date fields will be blank) Date Medicare IM given:   Date Additional Medicare IM given:    Discharge Disposition:    Per UR Regulation:  Reviewed for med. necessity/level of care/duration of stay  If discussed at Long Length of Stay Meetings, dates discussed:    Comments:  12/25/12 Rosalita Chessman [phone] PT FOR DC HOME TODAY.  PT GIVEN 30 DAY FREE TRIAL CARD FOR [facility].  12/24/12 Chynna Buerkle,RN,BSN [phone] TEE/CARDIOVERSION PLANNED FOR 12/3.

## 2012-12-23 NOTE — Progress Notes (Signed)
Pt is scheduled for TEE DCCV on Wed. 12/25/12 at [facility] the orders are in.

## 2012-12-23 NOTE — Progress Notes (Signed)
Echo Lab  2D Echocardiogram completed.  Nicholas Lynn L Clair Alfieri, RDCS 12/23/2012 9:02 AM

## 2012-12-23 NOTE — Progress Notes (Signed)
UR completed 

## 2012-12-23 NOTE — ED Notes (Signed)
Informed that admitting MD would like pt to have his chest xray and receive eliquis and amiodarone before going upstairs to floor.

## 2012-12-23 NOTE — ED Provider Notes (Signed)
I saw and evaluated the patient, reviewed the resident's note and I agree with the findings and plan.  EKG Interpretation    Date/Time:  Sunday December 22 2012 17:51:53 EST Ventricular Rate:  133 PR Interval:    QRS Duration: 92 QT Interval:  336 QTC Calculation: 500 R Axis:   -53 Text Interpretation:  Atrial fibrillation with RVR Left anterior fascicular block Premature atrial complexes Anteroseptal infarct , age undetermined Abnormal ECG Confirmed by Bralen Wiltgen  MD, Toni Amend ([id number]) on 12/22/2012 6:04:12 PM            See separate note for details.  Shon Baton, MD 12/23/12 [phone]

## 2012-12-23 NOTE — Progress Notes (Signed)
Subjective: Only discomfort was chest pressure when he first went into rapid rate.  No complaints now  Objective: Vital signs in last 24 hours: Temp:  [98 F (36.7 C)-98.8 F (37.1 C)] 98.8 F (37.1 C) (12/01 0451) Pulse Rate:  [58-130] 68 (12/01 0451) Resp:  [14-28] 20 (12/01 0100) BP: (106-161)/(57-112) 111/57 mmHg (12/01 0451) SpO2:  [94 %-98 %] 98 % (12/01 0451) Weight:  [265 lb 11.2 oz (120.521 kg)-267 lb (121.11 kg)] 265 lb 11.2 oz (120.521 kg) (12/01 0100) Weight change:  Last BM Date: 12/22/12 Intake/Output from previous day: -750 11/30 0701 - 12/01 0700 In: -  Out: 750 [Urine:750] Intake/Output this shift:    PE: General:Pleasant affect, NAD Skin:Warm and dry, brisk capillary refill HEENT:normocephalic, sclera clear, mucus membranes moist Neck:supple, no JVD, no bruits  Heart:irreg irreg without murmur, gallup, rub or click Lungs:clear without rales, rhonchi, or wheezes WUJ:WJXB, non tender, + BS, do not palpate liver spleen or masses Ext:no lower ext edema, 2+ pedal pulses, 2+ radial pulses Neuro:alert and oriented, MAE, follows commands, + facial symmetry   Lab Results:  Recent Labs  12/22/12 1815  WBC 9.7  HGB 15.6  HCT 45.3  PLT 166   BMET  Recent Labs  12/22/12 1815  NA 137  K 3.8  CL 102  CO2 26  GLUCOSE 140*  BUN 13  CREATININE 0.83  CALCIUM 9.2    Studies/Results: X-ray Chest Pa And Lateral  12/23/2012   CLINICAL DATA:  Atrial fibrillation.  EXAM: CHEST  2 VIEW  COMPARISON:  10/19/2011  FINDINGS: Prior median sternotomy. Midline trachea. Mild cardiomegaly with a tortuous thoracic aorta. No pleural effusion or pneumothorax. No congestive failure. Clear lungs. Chronic interstitial thickening.  IMPRESSION: Cardiomegaly without congestive failure.   Electronically Signed   By: Jeronimo Greaves M.D.   On: 12/23/2012 01:56    Medications: I have reviewed the patient's current medications. Scheduled Meds: . amiodarone  400 mg Oral  BID  . apixaban  5 mg Oral BID  . atorvastatin  10 mg Oral Daily  . ezetimibe  10 mg Oral Daily  . fluticasone  1 spray Each Nare Daily  . folic acid  1 mg Oral Daily  . iron polysaccharides  150 mg Oral Daily  . losartan  50 mg Oral Daily  . metoprolol tartrate  25 mg Oral BID  . montelukast  10 mg Oral QHS  . multivitamin with minerals  1 tablet Oral Daily  . niacin  1,000 mg Oral QHS  . omega-3 acid ethyl esters  1 g Oral BID  . pantoprazole  40 mg Oral Daily   Continuous Infusions:  PRN Meds:.acetaminophen, nitroGLYCERIN, ondansetron (ZOFRAN) IV  Assessment/Plan: Principal Problem:   Atrial fibrillation with RVR Active Problems:   CAD (coronary artery disease), CABG '96, re do CABG 09/11/11 X 3   HTN (hypertension)   Dyslipidemia   Sleep apnea, on C-pap    Paroxysmal atrial fibrillation   PLAN: placed on Eliquis 5 mg BID and amiodarone 400 mg BID.  He is NPO for possible DCCV today.  First episode of A fib since Re-do CABG.  Echo just completed.    LOS: 1 day   Time spent with pt. :15 minutes. [facility]  Nurse Practitioner Certified Pager [phone] or after 5pm and on weekends call [phone] 12/23/2012, 9:39 AM   Agree with note written by Nada Boozer RNP  Pt of Dr. Cloyde Nicholas Lynn with H/O redo CABG and  peri-op Afib on Amio until earlier this year. Admitted with CP , Afib with RVR. Rx with Cardizem and NOAC. Exam benign. Labs OK. Enz neg. Exam benign. TTE pending. Since we can't be sure of onset I prefer TEE DCCV which we will sched for 12/3.  Nicholas Lynn 12/23/2012 10:58 AM

## 2012-12-24 MED ORDER — METOPROLOL TARTRATE 50 MG PO TABS
50.0000 mg | ORAL_TABLET | Freq: Two times a day (BID) | ORAL | Status: DC
  Administered 2012-12-24: 50 mg via ORAL
  Filled 2012-12-24 (×3): qty 1

## 2012-12-24 NOTE — Progress Notes (Signed)
Subjective: No complaints. Denies palpations, lightheadedness, dizziness, CP, SOB.   Objective: Vital signs in last 24 hours: Temp:  [97.8 F (36.6 C)-98.4 F (36.9 C)] 97.8 F (36.6 C) (12/02 0503) Pulse Rate:  [65-83] 83 (12/02 0503) Resp:  [18-20] 20 (12/02 0503) BP: (97-115)/(50-64) 112/64 mmHg (12/02 0503) SpO2:  [96 %-97 %] 96 % (12/02 0503) Last BM Date: 12/23/12  Intake/Output from previous day: 12/01 0701 - 12/02 0700 In: 960 [P.O.:960] Out: 1300 [Urine:1300] Intake/Output this shift:    Medications Current Facility-Administered Medications  Medication Dose Route Frequency Provider Last Rate Last Dose  . acetaminophen (TYLENOL) tablet 650 mg  650 mg Oral Q4H PRN Othella Boyer, MD      . amiodarone (PACERONE) tablet 400 mg  400 mg Oral BID Othella Boyer, MD   400 mg at 12/23/12 2130  . apixaban (ELIQUIS) tablet 5 mg  5 mg Oral BID Othella Boyer, MD   5 mg at 12/23/12 2130  . atorvastatin (LIPITOR) tablet 10 mg  10 mg Oral Daily Othella Boyer, MD   10 mg at 12/23/12 1020  . ezetimibe (ZETIA) tablet 10 mg  10 mg Oral Daily Othella Boyer, MD   10 mg at 12/23/12 1018  . fluticasone (FLONASE) 50 MCG/ACT nasal spray 1 spray  1 spray Each Nare Daily Marykay Lex, MD   1 spray at 12/23/12 1132  . folic acid (FOLVITE) tablet 1 mg  1 mg Oral Daily Othella Boyer, MD   1 mg at 12/23/12 1018  . iron polysaccharides (NIFEREX) capsule 150 mg  150 mg Oral Daily Othella Boyer, MD   150 mg at 12/23/12 1019  . losartan (COZAAR) tablet 50 mg  50 mg Oral Daily Othella Boyer, MD   50 mg at 12/23/12 1018  . metoprolol tartrate (LOPRESSOR) tablet 25 mg  25 mg Oral BID Marykay Lex, MD   25 mg at 12/23/12 2130  . montelukast (SINGULAIR) tablet 10 mg  10 mg Oral QHS Marykay Lex, MD   10 mg at 12/23/12 2129  . multivitamin with minerals tablet 1 tablet  1 tablet Oral Daily Othella Boyer, MD   1 tablet at 12/23/12 1018  . niacin CR capsule 1,000 mg  1,000  mg Oral QHS Marykay Lex, MD   1,000 mg at 12/23/12 2129  . nitroGLYCERIN (NITROSTAT) SL tablet 0.4 mg  0.4 mg Sublingual Q5 min PRN Othella Boyer, MD      . omega-3 acid ethyl esters (LOVAZA) capsule 1 g  1 g Oral BID Othella Boyer, MD   1 g at 12/23/12 2129  . ondansetron (ZOFRAN) injection 4 mg  4 mg Intravenous Q6H PRN Othella Boyer, MD      . pantoprazole (PROTONIX) EC tablet 40 mg  40 mg Oral Daily Othella Boyer, MD   40 mg at 12/23/12 1017    PE: General appearance: alert, cooperative and no distress Lungs: clear to auscultation bilaterally Heart: irregularly irregular rhythm Extremities: trace bilateral LEE Pulses: 2+ and symmetric Skin: warm and dry Neurologic: Grossly normal  Lab Results:   Recent Labs  12/22/12 1815  WBC 9.7  HGB 15.6  HCT 45.3  PLT 166   BMET  Recent Labs  12/22/12 1815  NA 137  K 3.8  CL 102  CO2 26  GLUCOSE 140*  BUN 13  CREATININE 0.83  CALCIUM 9.2   PT/INR  Recent Labs  12/22/12 1806  LABPROT 13.2  INR 1.02    Assessment/Plan    Principal Problem:   Atrial fibrillation with RVR Active Problems:   CAD (coronary artery disease), CABG '96, re do CABG 09/11/11 X 3   HTN (hypertension)   Dyslipidemia   Sleep apnea, on C-pap    Paroxysmal atrial fibrillation  Plan: Pt remains in atrial fibrillation w/ RVR. Ventricular rate fluctuating between 110s-120s. He is asymptomatic. BP is stable. Continue on PO amiodarone and BB. ? Increasing BB dose. Continue Eliquis for [facility].  If he does not convert medically, will plan for TEE/DCCV tomorrow. Will make NPO at midnight.    LOS: 2 days    Brittainy M. Delmer Islam 12/24/2012  Agree with note written by Boyce Medici  PAC  Continues in AFIB. On AMIO and Eliquis. Abree with increasing BB. For TEE DCCV tomorrow.  [name] 12/24/2012 11:58 AM 8:49 AM

## 2012-12-24 NOTE — Progress Notes (Signed)
Patient changed to inpatient status r/t medication adjustments prior to cardioversion.

## 2012-12-25 ENCOUNTER — Encounter (HOSPITAL_COMMUNITY)
Admission: EM | Disposition: A | Discharge status: Home / Self Care | Payer: Self-pay | Source: Home / Self Care | Attending: Cardiology

## 2012-12-25 ENCOUNTER — Encounter (HOSPITAL_COMMUNITY): Payer: Self-pay | Admitting: *Deleted

## 2012-12-25 ENCOUNTER — Encounter (HOSPITAL_COMMUNITY): Payer: Medicare Other | Admitting: Anesthesiology

## 2012-12-25 ENCOUNTER — Inpatient Hospital Stay (HOSPITAL_COMMUNITY): Payer: Medicare Other | Admitting: Anesthesiology

## 2012-12-25 HISTORY — PX: CARDIOVERSION: SHX1299

## 2012-12-25 HISTORY — PX: TEE WITHOUT CARDIOVERSION: SHX5443

## 2012-12-25 LAB — CBC
HCT: 43.1 % (ref 39.0–52.0)
Hemoglobin: 15.2 g/dL (ref 13.0–17.0)
MCHC: 35.3 g/dL (ref 30.0–36.0)
MCV: 88.7 fL (ref 78.0–100.0)
RDW: 13.6 % (ref 11.5–15.5)
WBC: 9.1 10*3/uL (ref 4.0–10.5)

## 2012-12-25 SURGERY — ECHOCARDIOGRAM, TRANSESOPHAGEAL
Anesthesia: Monitor Anesthesia Care

## 2012-12-25 MED ORDER — FENTANYL CITRATE 0.05 MG/ML IJ SOLN
INTRAMUSCULAR | Status: AC
  Filled 2012-12-25: qty 2

## 2012-12-25 MED ORDER — BUTAMBEN-TETRACAINE-BENZOCAINE 2-2-14 % EX AERO
INHALATION_SPRAY | CUTANEOUS | Status: DC | PRN
  Administered 2012-12-25: 1 via TOPICAL

## 2012-12-25 MED ORDER — FENTANYL CITRATE 0.05 MG/ML IJ SOLN
INTRAMUSCULAR | Status: DC | PRN
  Administered 2012-12-25 (×2): 25 ug via INTRAVENOUS

## 2012-12-25 MED ORDER — SODIUM CHLORIDE 0.9 % IV SOLN
INTRAVENOUS | Status: DC
  Administered 2012-12-25: 500 mL via INTRAVENOUS

## 2012-12-25 MED ORDER — MIDAZOLAM HCL 5 MG/ML IJ SOLN
INTRAMUSCULAR | Status: AC
  Filled 2012-12-25: qty 2

## 2012-12-25 MED ORDER — AMIODARONE HCL 400 MG PO TABS: 400.0000 mg | ORAL_TABLET | Freq: Two times a day (BID) | ORAL | Status: DC

## 2012-12-25 MED ORDER — APIXABAN 5 MG PO TABS: 5.0000 mg | ORAL_TABLET | Freq: Two times a day (BID) | ORAL | Status: DC

## 2012-12-25 MED ORDER — LIDOCAINE VISCOUS 2 % MT SOLN
OROMUCOSAL | Status: AC
  Filled 2012-12-25: qty 15

## 2012-12-25 MED ORDER — MIDAZOLAM HCL 10 MG/2ML IJ SOLN
INTRAMUSCULAR | Status: DC | PRN
  Administered 2012-12-25: 1 mg via INTRAVENOUS
  Administered 2012-12-25: 2 mg via INTRAVENOUS

## 2012-12-25 MED ORDER — PROPOFOL 10 MG/ML IV BOLUS
INTRAVENOUS | Status: DC | PRN
  Administered 2012-12-25: 60 mg via INTRAVENOUS

## 2012-12-25 MED ORDER — METOPROLOL TARTRATE 50 MG PO TABS: 50.0000 mg | ORAL_TABLET | Freq: Two times a day (BID) | ORAL | Status: DC

## 2012-12-25 MED ORDER — LIDOCAINE VISCOUS 2 % MT SOLN
OROMUCOSAL | Status: DC | PRN
  Administered 2012-12-25: 20 mL via OROMUCOSAL

## 2012-12-25 NOTE — Anesthesia Preprocedure Evaluation (Addendum)
Anesthesia Evaluation  Patient identified by MRN, date of birth, ID band Patient awake  General Assessment Comment:Pt sedated already by cardiologist.  Reviewed: Allergy & Precautions, H&P , NPO status , Patient's Chart, lab work & pertinent test results, reviewed documented beta blocker date and time   History of Anesthesia Complications Negative for: history of anesthetic complications  Airway Mallampati: II TM Distance: >3 FB Neck ROM: full    Dental  (+) Caps and Dental Advisory Given   Pulmonary sleep apnea and Continuous Positive Airway Pressure Ventilation , COPD COPD inhaler, former smoker (quit '88),  breath sounds clear to auscultation  Pulmonary exam normal       Cardiovascular hypertension, Pt. on medications and Pt. on home beta blockers - angina+ CAD and + CABG + dysrhythmias Atrial Fibrillation Rhythm:Irregular Rate:Normal  12/14 ECHO: EF 35-45%, valves OK   Neuro/Psych    GI/Hepatic Neg liver ROS, GERD-  Medicated and Controlled,  Endo/Other  Morbid obesity  Renal/GU negative Renal ROS     Musculoskeletal   Abdominal (+) + obese,   Peds  Hematology   Anesthesia Other Findings   Reproductive/Obstetrics                        Anesthesia Physical Anesthesia Plan  ASA: III  Anesthesia Plan: General   Post-op Pain Management:    Induction: Intravenous  Airway Management Planned: Mask  Additional Equipment:   Intra-op Plan:   Post-operative Plan:   Informed Consent: I have reviewed the patients History and Physical, chart, labs and discussed the procedure including the risks, benefits and alternatives for the proposed anesthesia with the patient or authorized representative who has indicated his/her understanding and acceptance.   Dental advisory given  Plan Discussed with: CRNA, Anesthesiologist and Surgeon  Anesthesia Plan Comments: (Plan routine monitors, GA for  cardioversion)       Anesthesia Quick Evaluation

## 2012-12-25 NOTE — Transfer of Care (Signed)
Immediate Anesthesia Transfer of Care Note  Patient: Nicholas Lynn  Procedure(s) Performed: Procedure(s): TRANSESOPHAGEAL ECHOCARDIOGRAM (TEE) (N/A) CARDIOVERSION (N/A)  Patient Location: PACU and Endoscopy Unit  Anesthesia Type:General  Level of Consciousness: awake, sedated and patient cooperative  Airway & Oxygen Therapy: Patient Spontanous Breathing and Patient connected to nasal cannula oxygen  Post-op Assessment: Report given to PACU RN, Post -op Vital signs reviewed and stable and Patient moving all extremities  Post vital signs: Reviewed and stable  Complications: No apparent anesthesia complications

## 2012-12-25 NOTE — Progress Notes (Signed)
  Echocardiogram Echocardiogram Transesophageal has been performed.  Nicholas Lynn 12/25/2012, 12:28 PM

## 2012-12-25 NOTE — Discharge Summary (Signed)
Physician Discharge Summary  Patient ID: Nicholas Lynn MRN: [id number] DOB/AGE: March 28, 1945 67 y.o.  Admit date: 12/22/2012 Discharge date: 12/25/2012  Admission Diagnoses:  Atrial fibrillation with RVR.  Discharge Diagnoses:  Principal Problem:   Atrial fibrillation with RVR Active Problems:   CAD (coronary artery disease), CABG '96, re do CABG 09/11/11 X 3   HTN (hypertension)   Dyslipidemia   Sleep apnea, on C-pap    Paroxysmal atrial fibrillation   Discharged Condition: stable  Hospital Course:   This 67 year old male has a history of coronary artery disease with previous anterior infarction in 1988 treated with angioplasty. He had bypass grafting in 1996 and then redo bypass grafting in August of 2013. His grafts are detailed in the problem list. He had done well since bypass did have atrial fibrillation was on warfarin until January of this year as well as amiodarone. In January both amiodarone and warfarin were discontinued. He completed rehabilitation and had felt well without recurrence of arrhythmia. He was in his usual state of health but around 5 PM the day of admission after getting up from the bathroom noted the onset of rapid palpitations and irregular heartbeat. He came to the emergency room where he was found to be in rapid atrial fibrillation. He was given metoprolol as well as diltiazem with some slowing of his heart rate.   He did not have any angina and denied symptoms of heart failure. He had no PND, orthopnea or significant edema. He has no significant claudication.  He was admitted and started on Eliquis and amiodarone.  TEE/DCCv was arranged and completed on 12/25/12.  He converted without complications.  2D echo on 12/23/12 revealed and EF of 35-45% with wall motion abnormalities(See full report below).  Amiodarone 400mg  for seven days total then decreased to 400mg  daily.  The patient was seen by Dr. Rennis Golden who felt he was stable for DC home.  Follow up in 2 weeks.      Consults: none  Significant Diagnostic Studies:  2D echo Study Conclusions  - Procedure narrative: Transthoracic echocardiography. Image quality was suboptimal. - Left ventricle: The cavity size was at the upper limits of normal. Wall thickness was increased in a pattern of mild LVH. There was concentric hypertrophy. Systolic function was moderately reduced. The estimated ejection fraction was in the range of 35% to 45%. Images were inadequate for LV wall motion assessment. To the extent visible, there appears to be: Severe hypokinesis of the apical septal myocardium; moderate hypokinesis of the entire inferior myocardium; mild hypokinesis of the apical myocardium. - Ventricular septum: Septal motion showed dyssynergy and "bounce". 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 - Left atrium: The atrium was mildly dilated. Impressions:  - An atrial fibrillation rhythm was noted on this study, making this study technically difficult for the assessment of cardiac function.  TEE RESULTS Findings:  1. LEFT VENTRICLE: The left ventricular wall thickness is normal. The left ventricular cavity is dilated in size. Wall motion is severely reduced, specifically there is inferior akinesis and anterior/septal hypokinesis. LVEF is 30-35%. 2. RIGHT VENTRICLE: The right ventricle is normal in structure and function without any thrombus or masses.  3. LEFT ATRIUM: The left atrium is dilated in size without any thrombus or masses. There is not spontaneous echo contrast ("smoke") in the left atrium consistent with a low flow state. 4. LEFT ATRIAL APPENDAGE: The left atrial appendage is free of any thrombus or masses. The appendage has single lobes. Pulse doppler indicates low  flow in the appendage. 5. ATRIAL SEPTUM: The atrial septum appears intact and is free of thrombus and/or masses. There is no evidence for interatrial shunting by color doppler and saline microbubble. 6. RIGHT  ATRIUM: The right atrium is normal in size and function without any thrombus or masses. 7. MITRAL VALVE: The mitral valve is normal in structure and function with Mild regurgitation. There were no vegetations or stenosis. 8. AORTIC VALVE: The aortic valve is normal in structure and function with no regurgitation. There were no vegetations or stenosis 9. TRICUSPID VALVE: The tricuspid valve is normal in structure and function with Mild regurgitation. There were no vegetations or stenosis 10. PULMONIC VALVE: The pulmonic valve is normal in structure and function with trivial regurgitation. There were no vegetations or stenosis. 11. AORTIC ARCH, ASCENDING AND DESCENDING AORTA: There was grade 2 Myrtis Ser et. 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   Treatments: See above.   Discharge Exam: Blood pressure 120/60, pulse 63, temperature 98.1 F (36.7 C), temperature source Oral, resp. rate 17, height 6\' 2"  (1.88 m), weight 265 lb 11.2 oz (120.521 kg), SpO2 96.00%.   Disposition: 01-Home or Self Care      Discharge Orders   Future Orders Complete By Expires   Diet - low sodium heart healthy  As directed    Increase activity slowly  As directed        Medication List         amiodarone 400 MG tablet  Commonly known as:  PACERONE  Take 1 tablet (400 mg total) by mouth 2 (two) times daily.     apixaban 5 MG Tabs tablet  Commonly known as:  ELIQUIS  Take 1 tablet (5 mg total) by mouth 2 (two) times daily.     aspirin EC 81 MG tablet  Take 81 mg by mouth daily.     atorvastatin 10 MG tablet  Commonly known as:  LIPITOR  Take 10 mg by mouth daily.     cetirizine 10 MG tablet  Commonly known as:  ZYRTEC  Take 10 mg by mouth daily.     ESTER C PO  Take 500 mg by mouth daily.     ezetimibe 10 MG tablet  Commonly known as:  ZETIA  Take 10 mg by mouth daily.     FISH OIL PO  Take 1,000 mg by mouth 2 (two) times daily.     fluticasone 27.5  MCG/SPRAY nasal spray  Commonly known as:  VERAMYST  Place 1 spray into the nose daily. For allergies     folic acid 1 MG tablet  Commonly known as:  FOLVITE  Take 1 mg by mouth daily.     iron polysaccharides 150 MG capsule  Commonly known as:  NIFEREX  Take 150 mg by mouth daily.     losartan 50 MG tablet  Commonly known as:  COZAAR  Take 1 tablet (50 mg total) by mouth daily.     metoprolol 50 MG tablet  Commonly known as:  LOPRESSOR  Take 1 tablet (50 mg total) by mouth 2 (two) times daily.     montelukast 10 MG tablet  Commonly known as:  SINGULAIR  Take 10 mg by mouth at bedtime.     multivitamin with minerals Tabs tablet  Take 1 tablet by mouth daily.     niacin 1000 MG CR tablet  Commonly known as:  NIASPAN  Take 1,000 mg by mouth at bedtime.  nitroGLYCERIN 0.4 MG SL tablet  Commonly known as:  NITROSTAT  Place 0.4 mg under the tongue every 5 (five) minutes as needed for chest pain.     omeprazole 20 MG capsule  Commonly known as:  PRILOSEC  Take 20 mg by mouth daily.     OSTEO BI-FLEX ADV TRIPLE ST PO  Take 1 tablet by mouth daily.     VITAMIN D-3 PO  Take 1,000 Units by mouth 2 (two) times daily.       Follow-up Information   Follow up with Alyan Hartline, PA-C. (Our office will call you with the appt date and time. )    Specialty:  Physician Assistant   Contact information:   [address] [place] [address] [phone]       Signed: [name] 12/25/2012, 4:05 PM

## 2012-12-25 NOTE — CV Procedure (Signed)
TEE/CARDIOVERSION NOTE  TRANSESOPHAGEAL ECHOCARDIOGRAM (TEE):  Indictation: Atrial Fibrillation  Consent:   Informed consent was obtained prior to the procedure. The risks, benefits and alternatives for the procedure were discussed and the patient comprehended these risks.  Risks include, but are not limited to, cough, sore throat, vomiting, nausea, somnolence, esophageal and stomach trauma or perforation, bleeding, low blood pressure, aspiration, pneumonia, infection, trauma to the teeth and death.    Time Out: Verified patient identification, verified procedure, site/side was marked, verified correct patient position, special equipment/implants available, medications/allergies/relevent history reviewed, required imaging and test results available. Performed  Procedure:  After a procedural time-out, the patient was given 3 mg versed and 50 mcg fentanyl for moderate sedation.  The oropharynx was anesthetized 10 cc of topical 1% viscous lidocaine and 1 cetacaine spray.  The transesophageal probe was inserted in the esophagus and stomach without difficulty and multiple views were obtained.  The patient was kept under observation until the patient left the procedure room.  The patient left the procedure room in stable condition.   Agitated microbubble saline contrast was administered.  Complications:    Complications: None Patient did tolerate procedure well.  Findings:  1. LEFT VENTRICLE: The left ventricular wall thickness is normal.  The left ventricular cavity is dilated in size. Wall motion is severely reduced, specifically there is inferior akinesis and anterior/septal hypokinesis.  LVEF is 30-35%.  2. RIGHT VENTRICLE:  The right ventricle is normal in structure and function without any thrombus or masses.    3. LEFT ATRIUM:  The left atrium is dilated in size without any thrombus or masses.  There is not spontaneous echo contrast ("smoke") in the left atrium consistent with a  low flow state.  4. LEFT ATRIAL APPENDAGE:  The left atrial appendage is free of any thrombus or masses. The appendage has single lobes. Pulse doppler indicates low flow in the appendage.  5. ATRIAL SEPTUM:  The atrial septum appears intact and is free of thrombus and/or masses.  There is no evidence for interatrial shunting by color doppler and saline microbubble.  6. RIGHT ATRIUM:  The right atrium is normal in size and function without any thrombus or masses.  7. MITRAL VALVE:  The mitral valve is normal in structure and function with Mild regurgitation.  There were no vegetations or stenosis.  8. AORTIC VALVE:  The aortic valve is normal in structure and function with no regurgitation.  There were no vegetations or stenosis  9. TRICUSPID VALVE:  The tricuspid valve is normal in structure and function with Mild regurgitation.  There were no vegetations or stenosis  10.  PULMONIC VALVE:  The pulmonic valve is normal in structure and function with trivial regurgitation.  There were no vegetations or stenosis.   11. AORTIC ARCH, ASCENDING AND DESCENDING AORTA:  There was grade 2 Myrtis Ser et. 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  CARDIOVERSION:     Second Time Out: Verified patient identification, verified procedure, site/side was marked, verified correct patient position, special equipment/implants available, medications/allergies/relevent history reviewed, required imaging and test results available.  Performed  Procedure:  1. Patient placed on cardiac monitor, pulse oximetry, supplemental oxygen as necessary.  2. Sedation administered per anesthesia 3. Pacer pads placed anterior and posterior chest. 4. Cardioverted 2 time(s).  5. Cardioverted at 150J biphasic and then 200J biphasic.  Complications:  Complications: None Patient did tolerate procedure well.  Impression:  1. Successful cardioversion to NSR with  2 synchronized biphasic  shocks. 2. LVEF 30-35% with inferior akinesis, anteroseptal and lateral hypokinesis. 3. No LAA thrombus 4. Intact atrial septum - no evidence for PFO  Recommendations:  1. Continue amiodarone 400 mg po BID for total of 7 days, then reduce to 400 mg daily. 2. Eliquis 5 mg BID for anticoagulation - would not interrupt anticoagulation for at least another month. 3. Probably ok to d/c home later today. 4. Consider outpatient NST if he has not had an ischemia evaluation recently, given his reduced EF from baseline and history of CAD/CABG.  Time Spent Directly with the Patient:  60 minutes   Chrystie Nose, MD, [facility] Attending Cardiologist [facility]  12/25/2012, 12:43 PM

## 2012-12-25 NOTE — Progress Notes (Signed)
Pt. Seen and examined. Agree with the NP/PA-C note as written.  Remains in rate-controlled a-fib. He has been symptomatic with this. Now on amiodarone. Discussed risk/benefit of TEE/Cardioversion with him today and he wishes to proceed. He does have OSA and we will need to be careful with sedation.  Hopeful d/c later today if he remains in sinus. Would recommend an outpatient NST given his history of CABG to r/o possible ischemic cause. Troponins have been negative.  Chrystie Nose, MD, [facility] Attending Cardiologist [facility]

## 2012-12-25 NOTE — H&P (Signed)
     INTERVAL PROCEDURE H&P  History and Physical Interval Note:  12/25/2012 9:32 AM  Nicholas Lynn has presented today for their planned procedure. The various methods of treatment have been discussed with the patient and family. After consideration of risks, benefits and other options for treatment, the patient has consented to the procedure.  The patients' outpatient history has been reviewed, patient examined, and no change in status from most recent office note within the past 30 days. I have reviewed the patients' chart and labs and will proceed as planned. Questions were answered to the patient's satisfaction.   Chrystie Nose, MD, [facility] Attending Cardiologist [facility]  HILTY,Kenneth C 12/25/2012, 9:32 AM

## 2012-12-25 NOTE — Progress Notes (Signed)
Subjective:  No CP, dizziness or SOB  Objective: Vital signs in last 24 hours: Temp:  [97.6 F (36.4 C)-98.6 F (37 C)] 97.6 F (36.4 C) (12/03 0521) Pulse Rate:  [59-87] 80 (12/03 0521) Resp:  [20] 20 (12/03 0521) BP: (105-120)/(62-69) 113/69 mmHg (12/03 0521) SpO2:  [95 %-98 %] 96 % (12/03 0521) Last BM Date: 12/25/10  Intake/Output from previous day: 12/02 0701 - 12/03 0700 In: 840 [P.O.:840] Out: 1451 [Urine:1450; Stool:1] Intake/Output this shift:    Medications Current Facility-Administered Medications  Medication Dose Route Frequency Provider Last Rate Last Dose  . acetaminophen (TYLENOL) tablet 650 mg  650 mg Oral Q4H PRN Othella Boyer, MD      . amiodarone (PACERONE) tablet 400 mg  400 mg Oral BID Othella Boyer, MD   400 mg at 12/24/12 2116  . apixaban (ELIQUIS) tablet 5 mg  5 mg Oral BID Othella Boyer, MD   5 mg at 12/24/12 2116  . atorvastatin (LIPITOR) tablet 10 mg  10 mg Oral Daily Othella Boyer, MD   10 mg at 12/24/12 1043  . ezetimibe (ZETIA) tablet 10 mg  10 mg Oral Daily Othella Boyer, MD   10 mg at 12/24/12 1044  . fluticasone (FLONASE) 50 MCG/ACT nasal spray 1 spray  1 spray Each Nare Daily Marykay Lex, MD   1 spray at 12/24/12 1043  . folic acid (FOLVITE) tablet 1 mg  1 mg Oral Daily Othella Boyer, MD   1 mg at 12/24/12 1043  . iron polysaccharides (NIFEREX) capsule 150 mg  150 mg Oral Daily Othella Boyer, MD   150 mg at 12/24/12 1044  . losartan (COZAAR) tablet 50 mg  50 mg Oral Daily Othella Boyer, MD   50 mg at 12/24/12 1043  . metoprolol (LOPRESSOR) tablet 50 mg  50 mg Oral BID [name], MD   50 mg at 12/24/12 2116  . montelukast (SINGULAIR) tablet 10 mg  10 mg Oral QHS Marykay Lex, MD   10 mg at 12/24/12 2116  . multivitamin with minerals tablet 1 tablet  1 tablet Oral Daily Othella Boyer, MD   1 tablet at 12/24/12 1044  . niacin CR capsule 1,000 mg  1,000 mg Oral QHS Marykay Lex, MD   1,000 mg at  12/24/12 2116  . nitroGLYCERIN (NITROSTAT) SL tablet 0.4 mg  0.4 mg Sublingual Q5 min PRN Othella Boyer, MD      . omega-3 acid ethyl esters (LOVAZA) capsule 1 g  1 g Oral BID Othella Boyer, MD   1 g at 12/24/12 2117  . ondansetron (ZOFRAN) injection 4 mg  4 mg Intravenous Q6H PRN Othella Boyer, MD      . pantoprazole (PROTONIX) EC tablet 40 mg  40 mg Oral Daily Othella Boyer, MD   40 mg at 12/24/12 1049    PE: General appearance: alert, cooperative and no distress Lungs: clear to auscultation bilaterally Heart: irregularly irregular rhythm Extremities: No LEE Pulses: 2+ and symmetric Skin: WArm and dry Neurologic: Grossly normal  Lab Results:   Recent Labs  12/22/12 1815  WBC 9.7  HGB 15.6  HCT 45.3  PLT 166   BMET  Recent Labs  12/22/12 1815  NA 137  K 3.8  CL 102  CO2 26  GLUCOSE 140*  BUN 13  CREATININE 0.83  CALCIUM 9.2   PT/INR  Recent Labs  12/22/12 1806  LABPROT  13.2  INR 1.02    Assessment/Plan  Principal Problem:   Atrial fibrillation with RVR Active Problems:   CAD (coronary artery disease), CABG '96, re do CABG 09/11/11 X 3   HTN (hypertension)   Dyslipidemia   Sleep apnea, on C-pap    Paroxysmal atrial fibrillation  Plan:  Maintaining Afib with very slow rate at times secondary to many 2.3 second pauses.  He also had one short episode of NSVT last night(7 beats).  I would avoid  increasing beta blocker further.  BP stable.  TEE/DCCV today.     LOS: 3 days    Navea Woodrow 12/25/2012 8:27 AM

## 2012-12-25 NOTE — Anesthesia Postprocedure Evaluation (Signed)
  Anesthesia Post-op Note  Patient: Nicholas Lynn  Procedure(s) Performed: Procedure(s): TRANSESOPHAGEAL ECHOCARDIOGRAM (TEE) (N/A) CARDIOVERSION (N/A)  Patient Location: PACU and Endoscopy Unit  Anesthesia Type:General  Level of Consciousness: awake, sedated and patient cooperative  Airway and Oxygen Therapy: Patient Spontanous Breathing and Patient connected to nasal cannula oxygen  Post-op Pain: none  Post-op Assessment: Post-op Vital signs reviewed, Patient's Cardiovascular Status Stable, Respiratory Function Stable and Patent Airway  Post-op Vital Signs: Reviewed and stable  Complications: No apparent anesthesia complications

## 2012-12-26 ENCOUNTER — Encounter (HOSPITAL_COMMUNITY): Payer: Self-pay | Admitting: Internal Medicine

## 2012-12-31 ENCOUNTER — Other Ambulatory Visit: Payer: Self-pay | Admitting: *Deleted

## 2012-12-31 MED ORDER — POLYSACCHARIDE IRON COMPLEX 150 MG PO CAPS: 150.0000 mg | ORAL_CAPSULE | Freq: Every day | ORAL | Status: DC

## 2013-01-01 ENCOUNTER — Emergency Department (HOSPITAL_COMMUNITY)
Admission: EM | Admit: 2013-01-01 | Discharge: 2013-01-01 | Disposition: A | Discharge status: Home / Self Care | Payer: Medicare Other | Attending: Emergency Medicine | Admitting: Emergency Medicine

## 2013-01-01 ENCOUNTER — Encounter (HOSPITAL_COMMUNITY): Payer: Self-pay | Admitting: Emergency Medicine

## 2013-01-01 DIAGNOSIS — J4489 Other specified chronic obstructive pulmonary disease: Secondary | ICD-10-CM | POA: Insufficient documentation

## 2013-01-01 DIAGNOSIS — I2589 Other forms of chronic ischemic heart disease: Secondary | ICD-10-CM | POA: Insufficient documentation

## 2013-01-01 DIAGNOSIS — I252 Old myocardial infarction: Secondary | ICD-10-CM | POA: Insufficient documentation

## 2013-01-01 DIAGNOSIS — G4730 Sleep apnea, unspecified: Secondary | ICD-10-CM | POA: Insufficient documentation

## 2013-01-01 DIAGNOSIS — G4733 Obstructive sleep apnea (adult) (pediatric): Secondary | ICD-10-CM | POA: Diagnosis present

## 2013-01-01 DIAGNOSIS — Z79899 Other long term (current) drug therapy: Secondary | ICD-10-CM | POA: Insufficient documentation

## 2013-01-01 DIAGNOSIS — J449 Chronic obstructive pulmonary disease, unspecified: Secondary | ICD-10-CM | POA: Insufficient documentation

## 2013-01-01 DIAGNOSIS — I10 Essential (primary) hypertension: Secondary | ICD-10-CM | POA: Diagnosis present

## 2013-01-01 DIAGNOSIS — IMO0002 Reserved for concepts with insufficient information to code with codable children: Secondary | ICD-10-CM | POA: Insufficient documentation

## 2013-01-01 DIAGNOSIS — Z87891 Personal history of nicotine dependence: Secondary | ICD-10-CM | POA: Insufficient documentation

## 2013-01-01 DIAGNOSIS — E669 Obesity, unspecified: Secondary | ICD-10-CM | POA: Diagnosis present

## 2013-01-01 DIAGNOSIS — I2510 Atherosclerotic heart disease of native coronary artery without angina pectoris: Secondary | ICD-10-CM | POA: Diagnosis present

## 2013-01-01 DIAGNOSIS — Z951 Presence of aortocoronary bypass graft: Secondary | ICD-10-CM | POA: Insufficient documentation

## 2013-01-01 DIAGNOSIS — Z7901 Long term (current) use of anticoagulants: Secondary | ICD-10-CM

## 2013-01-01 DIAGNOSIS — E785 Hyperlipidemia, unspecified: Secondary | ICD-10-CM | POA: Diagnosis present

## 2013-01-01 DIAGNOSIS — K219 Gastro-esophageal reflux disease without esophagitis: Secondary | ICD-10-CM | POA: Insufficient documentation

## 2013-01-01 DIAGNOSIS — Z7982 Long term (current) use of aspirin: Secondary | ICD-10-CM | POA: Insufficient documentation

## 2013-01-01 DIAGNOSIS — I4891 Unspecified atrial fibrillation: Secondary | ICD-10-CM | POA: Insufficient documentation

## 2013-01-01 DIAGNOSIS — I480 Paroxysmal atrial fibrillation: Secondary | ICD-10-CM | POA: Diagnosis present

## 2013-01-01 DIAGNOSIS — M129 Arthropathy, unspecified: Secondary | ICD-10-CM | POA: Insufficient documentation

## 2013-01-01 DIAGNOSIS — I255 Ischemic cardiomyopathy: Secondary | ICD-10-CM | POA: Diagnosis present

## 2013-01-01 HISTORY — DX: Unspecified atrial fibrillation: I48.91

## 2013-01-01 LAB — CBC WITH DIFFERENTIAL/PLATELET
Eosinophils Absolute: 0.1 10*3/uL (ref 0.0–0.7)
Eosinophils Relative: 1 % (ref 0–5)
HCT: 46.1 % (ref 39.0–52.0)
Hemoglobin: 15.9 g/dL (ref 13.0–17.0)
Lymphocytes Relative: 32 % (ref 12–46)
Lymphs Abs: 2.4 10*3/uL (ref 0.7–4.0)
MCH: 30.8 pg (ref 26.0–34.0)
MCV: 89.3 fL (ref 78.0–100.0)
Monocytes Absolute: 0.7 10*3/uL (ref 0.1–1.0)
Neutro Abs: 4.3 10*3/uL (ref 1.7–7.7)
Neutrophils Relative %: 58 % (ref 43–77)
Platelets: 169 10*3/uL (ref 150–400)
RDW: 13.8 % (ref 11.5–15.5)
WBC: 7.5 10*3/uL (ref 4.0–10.5)

## 2013-01-01 LAB — BASIC METABOLIC PANEL
CO2: 27 mEq/L (ref 19–32)
Calcium: 9.5 mg/dL (ref 8.4–10.5)
Chloride: 107 mEq/L (ref 96–112)
Creatinine, Ser: 0.95 mg/dL (ref 0.50–1.35)
GFR calc Af Amer: >90 mL/min (ref >90)
GFR calc non Af Amer: 83 mL/min — ABNORMAL LOW (ref >90)
Glucose, Bld: 132 mg/dL — ABNORMAL HIGH (ref 70–99)
Sodium: 142 mEq/L (ref 135–145)

## 2013-01-01 LAB — PROTIME-INR: INR: 1.19 (ref 0.00–1.49)

## 2013-01-01 MED ORDER — DILTIAZEM HCL 25 MG/5ML IV SOLN: 10.0000 mg | Freq: Once | INTRAVENOUS | Status: DC

## 2013-01-01 MED ORDER — AMIODARONE HCL 400 MG PO TABS: 400.0000 mg | ORAL_TABLET | Freq: Two times a day (BID) | ORAL | Status: DC

## 2013-01-01 NOTE — ED Notes (Signed)
Pt states he was treated recently for AFib.  Pt states he had to be shocked.  Pt states he felt his pulse was irregular.  Pt denies CP, SOB.

## 2013-01-01 NOTE — ED Notes (Signed)
Cardiology at bedside.

## 2013-01-01 NOTE — H&P (Signed)
Patient ID: Nicholas Lynn MRN: [id number], DOB/AGE: 1945/10/12   Admit date: 01/01/2013   Primary Physician: [name], MD Primary Cardiologist: Dr Herbie Baltimore  HPI: See recent H&P and DC sum from 12/25/12 admission.  Pt just discharged after DCCV to NSR. His Amiodarone was tapered down to 400 mg daily as directed 3 days ago. This am he noted palpitations and then irregular rhythm. His rhythm is not fast. He has no significant symptoms but is aware he is in AF.   Problem List: Past Medical History  Diagnosis Date  . Hyperlipidemia   . Hypertension   . Hernia     umbilical  . GERD (gastroesophageal reflux disease)   . Arthritis   . Coronary artery disease 1998; 1996; 2013    PTCA LCX; CABG X 4; RE-do CABG  . Hx of myocardial infarction 1983    medically treated   . OSA on CPAP   . COPD (chronic obstructive pulmonary disease)     mild on singulair  . A-fib     Past Surgical History  Procedure Laterality Date  . Angioplasty  1993    LCX  . Cardiac catheterization  2008;2013  . Cataract extraction    . Umbilical hernia repair  04/04/2011    Procedure: HERNIA REPAIR UMBILICAL ADULT;  Surgeon: Adolph Pollack, MD;  Location: [facility] ORS;  Service: General;  Laterality: N/A;  Umbilical Hernia Repair  . Tee without cardioversion  09/11/2011    Procedure: TRANSESOPHAGEAL ECHOCARDIOGRAM (TEE);  Surgeon: Delight Ovens, MD;  Location: [facility] OR;  Service: Open Heart Surgery;  Laterality: N/A;  . Coronary artery bypass graft  1996    3 VESSELS  . Coronary artery bypass graft  09/11/2011    Procedure: REDO CORONARY ARTERY BYPASS GRAFTING (CABG);  Surgeon: Delight Ovens, MD;  Location: [facility] OR;  Service: Open Heart Surgery;  Laterality: N/A;  Redo Coronary Artery Bypass Graft times three utilizing the right internal mammary artery and the left  and right greater saphenous veins.  Rhae Hammock without cardioversion N/A 12/25/2012    Procedure: TRANSESOPHAGEAL ECHOCARDIOGRAM (TEE);  Surgeon: Chrystie Nose, MD;  Location: [facility] ENDOSCOPY;  Service: Cardiovascular;  Laterality: N/A;  . Cardioversion N/A 12/25/2012    Procedure: CARDIOVERSION;  Surgeon: Chrystie Nose, MD;  Location: [facility] ENDOSCOPY;  Service: Cardiovascular;  Laterality: N/A;     Allergies:  Allergies  Allergen Reactions  . Sulfa Antibiotics Swelling     Home Medications No current facility-administered medications for this encounter.   Current Outpatient Prescriptions  Medication Sig Dispense Refill  . amiodarone (PACERONE) 400 MG tablet Take 400 mg by mouth daily.      Marland Kitchen apixaban (ELIQUIS) 5 MG TABS tablet Take 1 tablet (5 mg total) by mouth 2 (two) times daily.  60 tablet  5  . aspirin EC 81 MG tablet Take 81 mg by mouth daily.      Marland Kitchen atorvastatin (LIPITOR) 10 MG tablet Take 10 mg by mouth daily.      Marland Kitchen Bioflavonoid Products (ESTER C PO) Take 500 mg by mouth daily.        . cetirizine (ZYRTEC) 10 MG tablet Take 10 mg by mouth daily.        . Cholecalciferol (VITAMIN D-3 PO) Take 1,000 Units by mouth 2 (two) times daily.       Marland Kitchen ezetimibe (ZETIA) 10 MG tablet Take 10 mg by mouth daily.      . fluticasone (VERAMYST) 27.5 MCG/SPRAY nasal spray  Place 1 spray into the nose daily. For allergies      . folic acid (FOLVITE) 1 MG tablet Take 1 mg by mouth daily.      . iron polysaccharides (NIFEREX) 150 MG capsule Take 1 capsule (150 mg total) by mouth daily.  30 capsule  5  . losartan (COZAAR) 50 MG tablet Take 1 tablet (50 mg total) by mouth daily.  30 tablet  3  . metoprolol (LOPRESSOR) 50 MG tablet Take 1 tablet (50 mg total) by mouth 2 (two) times daily.  60 tablet  5  . Misc Natural Products (OSTEO BI-FLEX ADV TRIPLE ST PO) Take 1 tablet by mouth daily.       . montelukast (SINGULAIR) 10 MG tablet Take 10 mg by mouth at bedtime.       . Multiple Vitamin (MULTIVITAMIN WITH MINERALS) TABS Take 1 tablet by mouth daily.      . niacin (NIASPAN) 1000 MG CR tablet Take 1,000 mg by mouth at bedtime.        . nitroGLYCERIN  (NITROSTAT) 0.4 MG SL tablet Place 0.4 mg under the tongue every 5 (five) minutes as needed for chest pain.      . Omega-3 Fatty Acids (FISH OIL PO) Take 1,000 mg by mouth 2 (two) times daily.        Marland Kitchen omeprazole (PRILOSEC) 20 MG capsule Take 20 mg by mouth daily.          Family History  Problem Relation Age of Onset  . Stroke Mother   . Cancer Father     lung  . Cancer Sister     lung  . Cancer Sister     liver     History   Social History  . Marital Status: Married    Spouse Name: N/A    Number of Children: N/A  . Years of Education: N/A   Occupational History  . Not on file.   Social History Main Topics  . Smoking status: Former Smoker    Quit date: 03/27/1986  . Smokeless tobacco: Never Used  . Alcohol Use: No  . Drug Use: No  . Sexual Activity: Not on file   Other Topics Concern  . Not on file   Social History Narrative   Retired Psychologist, occupational     Review of Systems: General: negative for chills, fever, night sweats or weight changes.  Cardiovascular: negative for chest pain, dyspnea on exertion, edema, orthopnea, palpitations, paroxysmal nocturnal dyspnea or shortness of breath Dermatological: negative for rash Respiratory: negative for cough or wheezing Urologic: negative for hematuria Abdominal: negative for nausea, vomiting, diarrhea, bright red blood per rectum, melena, or hematemesis Neurologic: negative for visual changes, syncope, or dizziness All other systems reviewed and are otherwise negative except as noted above.  Physical Exam: Blood pressure 117/53, pulse 103, temperature 97.7 F (36.5 C), resp. rate 16, height 6\' 2"  (1.88 m), weight 270 lb 4 oz (122.585 kg), SpO2 96.00%.  General appearance: alert, cooperative and no distress Neck: no JVD Lungs: clear to auscultation bilaterally Heart: irregularly irregular rhythm Abdomen: soft, non-tender; bowel sounds normal; no masses,  no organomegaly Extremities: no edema Pulses: 2+ and  symmetric Skin: soft large cyst on his Lt upper back. Neurologic: Grossly normal    Labs:  No results found for this or any previous visit (from the past 24 hour(s)).   Radiology/Studies: X-ray Chest Pa And Lateral  12/23/2012   CLINICAL DATA:  Atrial fibrillation.  EXAM: CHEST  2 VIEW  COMPARISON:  10/19/2011  FINDINGS: Prior median sternotomy. Midline trachea. Mild cardiomegaly with a tortuous thoracic aorta. No pleural effusion or pneumothorax. No congestive failure. Clear lungs. Chronic interstitial thickening.  IMPRESSION: Cardiomegaly without congestive failure.   Electronically Signed   By: Jeronimo Greaves M.D.   On: 12/23/2012 01:56    EKG:AF with CVR  ASSESSMENT AND PLAN:  Principal Problem:   PAF recurrent- s/p recent DCCV on Amiodarone 12/26/12 Active Problems:   CAD, CABG '96, re do CABG 09/11/11 X 3   HTN (hypertension)   Sleep apnea, on C-pap    Ischemic cardiomyopathy, "moderate LVD" at cath 09/01/11   Paroxysmal atrial fibrillation   Dyslipidemia   Obesity (BMI 30-39.9)   PLAN: Will discuss with MD, not sure he needs admission. Consider increasing his Amio for a few days, he may convert spontaneously as an OP.   Deland Pretty, PA-C 01/01/2013, 12:18 PM  I have seen and examined the patient along with Corine Shelter, PA C.  I have reviewed the chart, notes and new data.  I agree with PA's note.  Key new complaints: feels low energy level, "not right", but denies dyspnea, angina or presyncope Key examination changes: normal exam except irregular rhythm (good rate control) and sternotomy scar Key new findings / data: labs are all normal.  PLAN: Rate controlled AF on full anticoagulation He is a large man and will likely need a longer amiodarone load to achieve therapeutic tissue levels/deposits. Cardioversion before we achieve that might lead to early antiarrhythmic failure again. Increase amiodarone to 400 mg BID. Bring back to office in a week. If still in AF  schedule for cardioversion -TEE will not be necessary if he is compliant with Eliquis (which he states he is).  Thurmon Fair, MD, [facility] [phone] 01/01/2013, 4:33 PM

## 2013-01-01 NOTE — ED Provider Notes (Signed)
CSN: [id number]     Arrival date & time 01/01/13  1103 History   First MD Initiated Contact with Patient 01/01/13 1125     Chief Complaint  Patient presents with  . Atrial Fibrillation   (Consider location/radiation/quality/duration/timing/severity/associated sxs/prior Treatment) The history is provided by the patient.  BRIXON ZHEN is a 67 y.o. male history of hypertension, hyperlipidemia, A. fib on Eliquis here presenting with palpitations. Woke up this morning around 7:30 a.m. Around 8 AM he started having some palpitations and felt like his A. Fib. He denies chest pain or shortness of breath. Of note he was admitted about 2 weeks ago and had cardioversion done and went back to sinus rhythm.     Past Medical History  Diagnosis Date  . Hyperlipidemia   . Hypertension   . Hernia     umbilical  . GERD (gastroesophageal reflux disease)   . Arthritis   . Coronary artery disease 1998; 1996; 2013    PTCA LCX; CABG X 4; RE-do CABG  . Hx of myocardial infarction 1983    medically treated   . OSA on CPAP   . COPD (chronic obstructive pulmonary disease)     mild on singulair  . A-fib    Past Surgical History  Procedure Laterality Date  . Angioplasty  1993    LCX  . Cardiac catheterization  2008;2013  . Cataract extraction    . Umbilical hernia repair  04/04/2011    Procedure: HERNIA REPAIR UMBILICAL ADULT;  Surgeon: Adolph Pollack, MD;  Location: [facility] ORS;  Service: General;  Laterality: N/A;  Umbilical Hernia Repair  . Tee without cardioversion  09/11/2011    Procedure: TRANSESOPHAGEAL ECHOCARDIOGRAM (TEE);  Surgeon: Delight Ovens, MD;  Location: [facility] OR;  Service: Open Heart Surgery;  Laterality: N/A;  . Coronary artery bypass graft  1996    3 VESSELS  . Coronary artery bypass graft  09/11/2011    Procedure: REDO CORONARY ARTERY BYPASS GRAFTING (CABG);  Surgeon: Delight Ovens, MD;  Location: [facility] OR;  Service: Open Heart Surgery;  Laterality: N/A;  Redo Coronary Artery  Bypass Graft times three utilizing the right internal mammary artery and the left  and right greater saphenous veins.  Rhae Hammock without cardioversion N/A 12/25/2012    Procedure: TRANSESOPHAGEAL ECHOCARDIOGRAM (TEE);  Surgeon: Chrystie Nose, MD;  Location: [facility] ENDOSCOPY;  Service: Cardiovascular;  Laterality: N/A;  . Cardioversion N/A 12/25/2012    Procedure: CARDIOVERSION;  Surgeon: Chrystie Nose, MD;  Location: [facility] ENDOSCOPY;  Service: Cardiovascular;  Laterality: N/A;   Family History  Problem Relation Age of Onset  . Stroke Mother   . Cancer Father     lung  . Cancer Sister     lung  . Cancer Sister     liver   History  Substance Use Topics  . Smoking status: Former Smoker    Quit date: 03/27/1986  . Smokeless tobacco: Never Used  . Alcohol Use: No    Review of Systems  Cardiovascular: Positive for palpitations.  All other systems reviewed and are negative.    Allergies  Sulfa antibiotics  Home Medications   Current Outpatient Rx  Name  Route  Sig  Dispense  Refill  . amiodarone (PACERONE) 400 MG tablet   Oral   Take 400 mg by mouth daily.         Marland Kitchen apixaban (ELIQUIS) 5 MG TABS tablet   Oral   Take 1 tablet (5 mg total) by mouth  2 (two) times daily.   60 tablet   5   . aspirin EC 81 MG tablet   Oral   Take 81 mg by mouth daily.         Marland Kitchen atorvastatin (LIPITOR) 10 MG tablet   Oral   Take 10 mg by mouth daily.         Marland Kitchen Bioflavonoid Products (ESTER C PO)   Oral   Take 500 mg by mouth daily.           . cetirizine (ZYRTEC) 10 MG tablet   Oral   Take 10 mg by mouth daily.           . Cholecalciferol (VITAMIN D-3 PO)   Oral   Take 1,000 Units by mouth 2 (two) times daily.          Marland Kitchen ezetimibe (ZETIA) 10 MG tablet   Oral   Take 10 mg by mouth daily.         . fluticasone (VERAMYST) 27.5 MCG/SPRAY nasal spray   Nasal   Place 1 spray into the nose daily. For allergies         . folic acid (FOLVITE) 1 MG tablet   Oral   Take 1 mg  by mouth daily.         . iron polysaccharides (NIFEREX) 150 MG capsule   Oral   Take 1 capsule (150 mg total) by mouth daily.   30 capsule   5   . losartan (COZAAR) 50 MG tablet   Oral   Take 1 tablet (50 mg total) by mouth daily.   30 tablet   3   . metoprolol (LOPRESSOR) 50 MG tablet   Oral   Take 1 tablet (50 mg total) by mouth 2 (two) times daily.   60 tablet   5   . Misc Natural Products (OSTEO BI-FLEX ADV TRIPLE ST PO)   Oral   Take 1 tablet by mouth daily.          . montelukast (SINGULAIR) 10 MG tablet   Oral   Take 10 mg by mouth at bedtime.          . Multiple Vitamin (MULTIVITAMIN WITH MINERALS) TABS   Oral   Take 1 tablet by mouth daily.         . niacin (NIASPAN) 1000 MG CR tablet   Oral   Take 1,000 mg by mouth at bedtime.           . nitroGLYCERIN (NITROSTAT) 0.4 MG SL tablet   Sublingual   Place 0.4 mg under the tongue every 5 (five) minutes as needed for chest pain.         . Omega-3 Fatty Acids (FISH OIL PO)   Oral   Take 1,000 mg by mouth 2 (two) times daily.           Marland Kitchen omeprazole (PRILOSEC) 20 MG capsule   Oral   Take 20 mg by mouth daily.           BP 141/74  Pulse 91  Temp(Src) 97.7 F (36.5 C)  Resp 22  Ht 6\' 2"  (1.88 m)  Wt 270 lb 4 oz (122.585 kg)  BMI 34.68 kg/m2  SpO2 95% Physical Exam  Nursing note and vitals reviewed. Constitutional: He is oriented to person, place, and time. He appears well-developed and well-nourished.  HENT:  Head: Normocephalic.  Mouth/Throat: Oropharynx is clear and moist.  Eyes: Conjunctivae are normal. Pupils are equal, round,  and reactive to light.  Neck: Normal range of motion. Neck supple.  Cardiovascular: Normal heart sounds.   Irregular, tachy  Pulmonary/Chest: Effort normal and breath sounds normal. No respiratory distress. He has no wheezes. He has no rales.  Abdominal: Soft. Bowel sounds are normal. He exhibits no distension. There is no tenderness. There is no rebound  and no guarding.  Musculoskeletal: Normal range of motion.  Neurological: He is alert and oriented to person, place, and time.  Skin: Skin is warm and dry.  Psychiatric: He has a normal mood and affect. His behavior is normal. Judgment and thought content normal.    ED Course  Procedures (including critical care time) Labs Review Labs Reviewed  BASIC METABOLIC PANEL - Abnormal; Notable for the following:    Glucose, Bld 132 (*)    GFR calc non Af Amer 83 (*)    All other components within normal limits  CBC WITH DIFFERENTIAL  PROTIME-INR  POCT I-STAT TROPONIN I   Imaging Review No results found.  EKG Interpretation    Date/Time:  Wednesday January 01 2013 12:17:13 EST Ventricular Rate:  60 PR Interval:  211 QRS Duration: 85 QT Interval:  399 QTC Calculation: 399 R Axis:   14 Text Interpretation:  Sinus rhythm Atrial premature complex Abnormal T, consider ischemia, lateral leads afib resolved from previous, this EKG was on another patient. Name entered in error.  Reconfirmed by [facility]  MD, Michaelina Blandino [phone]) on 01/01/2013 12:30:14 PM          <ECGINTERP>   MDM   1. CAD (coronary artery disease)   2. Ischemic cardiomyopathy   3. PAF (paroxysmal atrial fibrillation)   4. Sleep apnea    KARMELO BASS is a 67 y.o. male here with palpitations. Initial EKG showed afib rate 110s. I called cardiology to cardiovert patient.   4:49 PM Cardiology, Dr. Amanda Cockayne saw patient. Recommend increase amiodarone to 400 mg BID. No cardioversion in the ED. He will f/u in office in 3 days and get cardioversion if still in afib.   Richardean Canal, MD 01/01/13 [phone]

## 2013-01-07 ENCOUNTER — Encounter: Payer: Self-pay | Admitting: Physician Assistant

## 2013-01-07 ENCOUNTER — Ambulatory Visit (INDEPENDENT_AMBULATORY_CARE_PROVIDER_SITE_OTHER): Payer: Medicare Other | Admitting: Physician Assistant

## 2013-01-07 ENCOUNTER — Encounter: Payer: Self-pay | Admitting: Internal Medicine

## 2013-01-07 VITALS — BP 120/70 | HR 48 | Ht 74.0 in | Wt 271.0 lb

## 2013-01-07 DIAGNOSIS — I10 Essential (primary) hypertension: Secondary | ICD-10-CM

## 2013-01-07 DIAGNOSIS — Z7901 Long term (current) use of anticoagulants: Secondary | ICD-10-CM

## 2013-01-07 DIAGNOSIS — I498 Other specified cardiac arrhythmias: Secondary | ICD-10-CM

## 2013-01-07 DIAGNOSIS — I480 Paroxysmal atrial fibrillation: Secondary | ICD-10-CM

## 2013-01-07 DIAGNOSIS — I4891 Unspecified atrial fibrillation: Secondary | ICD-10-CM

## 2013-01-07 DIAGNOSIS — R001 Bradycardia, unspecified: Secondary | ICD-10-CM

## 2013-01-07 MED ORDER — AMIODARONE HCL 400 MG PO TABS: 200.0000 mg | ORAL_TABLET | Freq: Every day | ORAL | Status: DC

## 2013-01-07 NOTE — Assessment & Plan Note (Deleted)
Maintaining sinus bradycardia in the 40's.  Asymptomatic.  I will reduce dose to 200mg  daily.   Follow up in 1 month.

## 2013-01-07 NOTE — Progress Notes (Signed)
Date:  01/07/2013   ID:  Nicholas Lynn, DOB 06-Mar-1945, MRN [id number]  PCP:  [name], MD  Primary Cardiologist:  Hilty     History of Present Illness: Nicholas Lynn is a 67 y.o. male  This 67 year old male has a history of coronary artery disease with previous anterior infarction in 1988 treated with angioplasty. He had bypass grafting in 1996 and then redo bypass grafting in August of 2013. His grafts are detailed in the problem list. He had done well since bypass did have atrial fibrillation was on warfarin until January of this year as well as amiodarone. In January both amiodarone and warfarin were discontinued. He completed rehabilitation and had felt well without recurrence of arrhythmia. He was in his usual state of health but around 5 PM the day of admission after getting up from the bathroom noted the onset of rapid palpitations and irregular heartbeat. He came to the emergency room where he was found to be in rapid atrial fibrillation. He was given metoprolol as well as diltiazem with some slowing of his heart rate. He did not have any angina and denied symptoms of heart failure. He had no PND, orthopnea or significant edema. He has no significant claudication.  He was admitted and started on Eliquis and amiodarone. TEE/DCCv was arranged and completed on 12/25/12. He converted without complications. 2D echo on 12/23/12 revealed and EF of 35-45% with wall motion abnormalities. Amiodarone 400mg  for seven days total then decreased to 400mg  daily.   The patient presents today for follow up.  He states that he feels great.  He denies dizziness, SOB, palpitations as well as nausea, vomiting, fever, chest pain, shortness of breath, orthopnea, PND, cough, congestion, abdominal pain, hematochezia, melena, lower extremity edema. His EKG shows sinus bradycardia.  He did notice his HR was slow. He also did not reduce the dose of amiodarone to 400mg  daily as it was written on his bottle.  Wt  Readings from Last 3 Encounters:  01/07/13 271 lb (122.925 kg)  01/01/13 270 lb 4 oz (122.585 kg)  12/23/12 265 lb 11.2 oz (120.521 kg)     Past Medical History  Diagnosis Date  . Hyperlipidemia   . Hypertension   . Hernia     umbilical  . GERD (gastroesophageal reflux disease)   . Arthritis   . Coronary artery disease 1998; 1996; 2013    PTCA LCX; CABG X 4; RE-do CABG  . Hx of myocardial infarction 1983    medically treated   . OSA on CPAP   . COPD (chronic obstructive pulmonary disease)     mild on singulair  . A-fib     Current Outpatient Prescriptions  Medication Sig Dispense Refill  . amiodarone (PACERONE) 400 MG tablet Take 0.5 tablets (200 mg total) by mouth daily.      Marland Kitchen apixaban (ELIQUIS) 5 MG TABS tablet Take 1 tablet (5 mg total) by mouth 2 (two) times daily.  60 tablet  5  . aspirin EC 81 MG tablet Take 81 mg by mouth daily.      Marland Kitchen atorvastatin (LIPITOR) 10 MG tablet Take 10 mg by mouth daily.      Marland Kitchen Bioflavonoid Products (ESTER C PO) Take 500 mg by mouth daily.        . cetirizine (ZYRTEC) 10 MG tablet Take 10 mg by mouth daily.        . Cholecalciferol (VITAMIN D-3 PO) Take 1,000 Units by mouth 2 (two) times daily.       Marland Kitchen  ezetimibe (ZETIA) 10 MG tablet Take 10 mg by mouth daily.      . fluticasone (VERAMYST) 27.5 MCG/SPRAY nasal spray Place 1 spray into the nose daily. For allergies      . folic acid (FOLVITE) 1 MG tablet Take 1 mg by mouth daily.      . iron polysaccharides (NIFEREX) 150 MG capsule Take 1 capsule (150 mg total) by mouth daily.  30 capsule  5  . losartan (COZAAR) 50 MG tablet Take 1 tablet (50 mg total) by mouth daily.  30 tablet  3  . metoprolol (LOPRESSOR) 50 MG tablet Take 1 tablet (50 mg total) by mouth 2 (two) times daily.  60 tablet  5  . Misc Natural Products (OSTEO BI-FLEX ADV TRIPLE ST PO) Take 1 tablet by mouth daily.       . montelukast (SINGULAIR) 10 MG tablet Take 10 mg by mouth at bedtime.       . Multiple Vitamin (MULTIVITAMIN  WITH MINERALS) TABS Take 1 tablet by mouth daily.      . niacin (NIASPAN) 1000 MG CR tablet Take 1,000 mg by mouth at bedtime.        . nitroGLYCERIN (NITROSTAT) 0.4 MG SL tablet Place 0.4 mg under the tongue every 5 (five) minutes as needed for chest pain.      . Omega-3 Fatty Acids (FISH OIL PO) Take 1,000 mg by mouth 2 (two) times daily.        Marland Kitchen omeprazole (PRILOSEC) 20 MG capsule Take 20 mg by mouth daily.        No current facility-administered medications for this visit.    Allergies:    Allergies  Allergen Reactions  . Sulfa Antibiotics Swelling    Social History:  The patient  reports that he quit smoking about 26 years ago. He has never used smokeless tobacco. He reports that he does not drink alcohol or use illicit drugs.   Family history:   Family History  Problem Relation Age of Onset  . Stroke Mother   . Cancer Father     lung  . Cancer Sister     lung  . Cancer Sister     liver    ROS:  Please see the history of present illness.  All other systems reviewed and negative.   PHYSICAL EXAM: VS:  BP 120/70  Pulse 48  Ht 6\' 2"  (1.88 m)  Wt 271 lb (122.925 kg)  BMI 34.78 kg/m2 Obese, well developed, in no acute distress HEENT: Pupils are equal round react to light accommodation extraocular movements are intact.  Neck: no JVDNo cervical lymphadenopathy. Cardiac: Regular rate and rhythm without murmurs rubs or gallops. Lungs:  clear to auscultation bilaterally, no wheezing, rhonchi or rales Ext: no lower extremity edema.  2+ radial and PT pulses. Skin: warm and dry Neuro:  Grossly normal  EKG:  Sinus bradycardia, 43 BPM.   ASSESSMENT AND PLAN:  Problem List Items Addressed This Visit   HTN (hypertension) (Chronic)     BP well controlled.    Relevant Medications      amiodarone (PACERONE) tablet   Chronic anticoagulation (Chronic)   PAF recurrent- s/p recent DCCV on Amiodarone 12/26/12     The patient continued 400mg  of amiodarone for and extra week.  He  did not realize he was supposed to decrease it to once daily.  I have decreased it today to 200mg  daily.  This will hopefully help is bradycardia.  He will follow up with Dr.  Hilty in a month.    Bradycardia     Decreased amiodarone dosing should help.     Other Visit Diagnoses   Paroxysmal atrial fibrillation  (Chronic)   -  Primary

## 2013-01-07 NOTE — Assessment & Plan Note (Addendum)
The patient continued 400mg  of amiodarone for and extra week.  He did not realize he was supposed to decrease it to once daily.  I have decreased it today to 200mg  daily.  This will hopefully help is bradycardia.  He will follow up with Dr. Rennis Golden in a month.

## 2013-01-07 NOTE — Assessment & Plan Note (Signed)
BP well controlled.

## 2013-01-07 NOTE — Assessment & Plan Note (Signed)
Decreased amiodarone dosing should help.

## 2013-01-07 NOTE — Patient Instructions (Signed)
1.  Decrease amiodarone to 200mg  daily. 2. Follow up with Dr. Rennis Golden in a month. 3.  Call if you need to be seen earlier. 4.  Have a [employer] Christmas!

## 2013-02-04 ENCOUNTER — Ambulatory Visit (INDEPENDENT_AMBULATORY_CARE_PROVIDER_SITE_OTHER): Payer: Medicare Other | Admitting: Internal Medicine

## 2013-02-04 ENCOUNTER — Encounter: Payer: Self-pay | Admitting: Internal Medicine

## 2013-02-04 VITALS — BP 152/84 | HR 42 | Ht 74.5 in | Wt 276.1 lb

## 2013-02-04 DIAGNOSIS — I10 Essential (primary) hypertension: Secondary | ICD-10-CM

## 2013-02-04 DIAGNOSIS — I4891 Unspecified atrial fibrillation: Secondary | ICD-10-CM

## 2013-02-04 DIAGNOSIS — I2589 Other forms of chronic ischemic heart disease: Secondary | ICD-10-CM

## 2013-02-04 DIAGNOSIS — I2510 Atherosclerotic heart disease of native coronary artery without angina pectoris: Secondary | ICD-10-CM

## 2013-02-04 DIAGNOSIS — E785 Hyperlipidemia, unspecified: Secondary | ICD-10-CM

## 2013-02-04 DIAGNOSIS — I480 Paroxysmal atrial fibrillation: Secondary | ICD-10-CM

## 2013-02-04 DIAGNOSIS — E669 Obesity, unspecified: Secondary | ICD-10-CM

## 2013-02-04 DIAGNOSIS — I255 Ischemic cardiomyopathy: Secondary | ICD-10-CM

## 2013-02-04 DIAGNOSIS — G4730 Sleep apnea, unspecified: Secondary | ICD-10-CM

## 2013-02-04 MED ORDER — VALSARTAN-HYDROCHLOROTHIAZIDE 160-12.5 MG PO TABS: 1.0000 | ORAL_TABLET | Freq: Every day | ORAL | Status: DC

## 2013-02-04 NOTE — Patient Instructions (Signed)
Your physician has recommended you make the following change in your medication: decrease metoprolol tartrate to 25mg  twice daily.   STOP losartan.   START diovan-hct 160-12.5mg  daily.  Your physician recommends that you schedule a follow-up appointment in: 2 weeks with [facility]   Your physician wants you to follow-up in: 6 month with Dr. Rennis Golden. You will receive a reminder letter in the mail two months in advance. If you don't receive a letter, please call our office to schedule the follow-up appointment.

## 2013-02-04 NOTE — Progress Notes (Signed)
OFFICE NOTE  Chief Complaint:  Hospital follow-up  Primary Care Physician: [name], MD  HPI:  Nicholas Lynn is a 68 year old male has a history of coronary artery disease with previous anterior infarction in 1988 treated with angioplasty. He had bypass grafting in 1996 and then redo bypass grafting in August of 2013. His grafts are detailed in the problem list. He had done well since bypass did have atrial fibrillation was on warfarin until January of this year as well as amiodarone. In January both amiodarone and warfarin were discontinued. He completed rehabilitation and had felt well without recurrence of arrhythmia. He was in his usual state of health but around 5 PM the day of admission after getting up from the bathroom noted the onset of rapid palpitations and irregular heartbeat. He came to the emergency room where he was found to be in rapid atrial fibrillation. He was given metoprolol as well as diltiazem with some slowing of his heart rate. He did not have any angina and denied symptoms of heart failure. He had no PND, orthopnea or significant edema. He has no significant claudication. He was admitted and started on Eliquis and amiodarone. TEE/DCCv was arranged and completed on 12/25/12. He converted without complications. 2D echo on 12/23/12 revealed and EF of 35-45% with wall motion abnormalities. Amiodarone 400mg  for seven days total then decreased to 400mg  daily. He recently saw [name], PA-C in followup and was noted to be bradycardic. His amiodarone was further decreased to 200 mg daily.  Despite his bradycardia with heart rate in the 40s, he reportedly is asymptomatic and is able to do most activities without much problem. He has noted however that his blood pressure continues to creep up somewhat and has been in the 150s or 160s systolic.  PMHx:  Past Medical History  Diagnosis Date  . Hyperlipidemia   . Hypertension   . Hernia     umbilical  . GERD  (gastroesophageal reflux disease)   . Arthritis   . Coronary artery disease 1998; 1996; 2013    PTCA LCX; CABG X 4; RE-do CABG  . Hx of myocardial infarction 1983    medically treated   . OSA on CPAP   . COPD (chronic obstructive pulmonary disease)     mild on singulair  . A-fib     Past Surgical History  Procedure Laterality Date  . Angioplasty  1993    LCX  . Cardiac catheterization  2008;2013  . Cataract extraction    . Umbilical hernia repair  04/04/2011    Procedure: HERNIA REPAIR UMBILICAL ADULT;  Surgeon: Adolph Pollack, MD;  Location: [facility] ORS;  Service: General;  Laterality: N/A;  Umbilical Hernia Repair  . Tee without cardioversion  09/11/2011    Procedure: TRANSESOPHAGEAL ECHOCARDIOGRAM (TEE);  Surgeon: Delight Ovens, MD;  Location: [facility] OR;  Service: Open Heart Surgery;  Laterality: N/A;  . Coronary artery bypass graft  1996    3 VESSELS  . Coronary artery bypass graft  09/11/2011    Procedure: REDO CORONARY ARTERY BYPASS GRAFTING (CABG);  Surgeon: Delight Ovens, MD;  Location: [facility] OR;  Service: Open Heart Surgery;  Laterality: N/A;  Redo Coronary Artery Bypass Graft times three utilizing the right internal mammary artery and the left  and right greater saphenous veins.  Rhae Hammock without cardioversion N/A 12/25/2012    Procedure: TRANSESOPHAGEAL ECHOCARDIOGRAM (TEE);  Surgeon: Chrystie Nose, MD;  Location: [facility] ENDOSCOPY;  Service: Cardiovascular;  Laterality: N/A;  .  Cardioversion N/A 12/25/2012    Procedure: CARDIOVERSION;  Surgeon: Chrystie Nose, MD;  Location: [facility] ENDOSCOPY;  Service: Cardiovascular;  Laterality: N/A;    FAMHx:  Family History  Problem Relation Age of Onset  . Stroke Mother   . Cancer Father     lung  . Cancer Sister     lung  . Cancer Sister     liver    SOCHx:   reports that he quit smoking about 26 years ago. He has never used smokeless tobacco. He reports that he does not drink alcohol or use illicit drugs.  ALLERGIES:  Allergies    Allergen Reactions  . Sulfa Antibiotics Swelling    ROS: A comprehensive review of systems was negative except for: Cardiovascular: positive for bradycardia  HOME MEDS: Current Outpatient Prescriptions  Medication Sig Dispense Refill  . amiodarone (PACERONE) 400 MG tablet Take 0.5 tablets (200 mg total) by mouth daily.      Marland Kitchen apixaban (ELIQUIS) 5 MG TABS tablet Take 1 tablet (5 mg total) by mouth 2 (two) times daily.  60 tablet  5  . aspirin EC 81 MG tablet Take 81 mg by mouth daily.      Marland Kitchen atorvastatin (LIPITOR) 10 MG tablet Take 10 mg by mouth daily.      Marland Kitchen Bioflavonoid Products (ESTER C PO) Take 500 mg by mouth daily.        . Cholecalciferol (VITAMIN D-3 PO) Take 1,000 Units by mouth 2 (two) times daily.       Marland Kitchen ezetimibe (ZETIA) 10 MG tablet Take 10 mg by mouth daily.      . fluticasone (VERAMYST) 27.5 MCG/SPRAY nasal spray Place 1 spray into the nose daily. For allergies      . folic acid (FOLVITE) 1 MG tablet Take 1 mg by mouth daily.      . iron polysaccharides (NIFEREX) 150 MG capsule Take 1 capsule (150 mg total) by mouth daily.  30 capsule  5  . metoprolol (LOPRESSOR) 50 MG tablet Take 25 mg by mouth 2 (two) times daily.      . Misc Natural Products (OSTEO BI-FLEX ADV TRIPLE ST PO) Take 1 tablet by mouth daily.       . montelukast (SINGULAIR) 10 MG tablet Take 10 mg by mouth at bedtime.       . Multiple Vitamin (MULTIVITAMIN WITH MINERALS) TABS Take 1 tablet by mouth daily.      . niacin (NIASPAN) 1000 MG CR tablet Take 1,000 mg by mouth at bedtime.        . nitroGLYCERIN (NITROSTAT) 0.4 MG SL tablet Place 0.4 mg under the tongue every 5 (five) minutes as needed for chest pain.      . Omega-3 Fatty Acids (FISH OIL PO) Take 1,000 mg by mouth 2 (two) times daily.        Marland Kitchen omeprazole (PRILOSEC) 20 MG capsule Take 20 mg by mouth daily.       . cetirizine (ZYRTEC) 10 MG tablet Take 10 mg by mouth daily.        . valsartan-hydrochlorothiazide (DIOVAN-HCT) 160-12.5 MG per tablet  Take 1 tablet by mouth daily.  30 tablet  6   No current facility-administered medications for this visit.    LABS/IMAGING: No results found for this or any previous visit (from the past 48 hour(s)). No results found.  VITALS: BP 152/84  Pulse 42  Ht 6' 2.5" (1.892 m)  Wt 276 lb 1.6 oz (125.238 kg)  BMI  34.99 kg/m2  EXAM: General appearance: alert and no distress Neck: no carotid bruit and no JVD Lungs: clear to auscultation bilaterally Heart: regular bradycardia Abdomen: soft, non-tender; bowel sounds normal; no masses,  no organomegaly Extremities: extremities normal, atraumatic, no cyanosis or edema Pulses: 2+ and symmetric Skin: Skin color, texture, turgor normal. No rashes or lesions Neurologic: Grossly normal Psych: Mood, affect normal  EKG: Marked sinus bradycardia at 42  ASSESSMENT: 1. Paroxysmal atrial fibrillation status post TEE/cardioversion - maintaining sinus on low-dose amiodarone 2. Ischemic cardiomyopathy EF 35-40% 3. Hypertension 4. CAD s/p CABG 5. Asymptomatic bradycardia  PLAN: 1.   [name] is doing fairly well after TEE cardioversion. He is tolerating Eliquis without any significant bleeding problems. He has not had recurrent A. fib and is maintaining amiodarone at low dose. Unfortunately, the decrease in his amiodarone has not affected his heart rate. I suspect he does have some degree of sinus node dysfunction.  I would recommend decreasing his metoprolol to 25 mg twice daily. As his blood pressure is elevated, I will need to compensate for that. I would therefore recommend discontinuing losartan and starting Diovan/HCT-160/12.5 mg daily.  I will schedule a two-week followup with [name] for a recheck of his blood pressure and heart rate on this new medicine combination.  Chrystie NoseKenneth C. Hilty, MD, [facility] Hospital[facility]ACC Attending Cardiologist [facility]  HILTY,Kenneth C 02/04/2013, 5:40 PM

## 2013-02-06 ENCOUNTER — Telehealth: Payer: Self-pay | Admitting: *Deleted

## 2013-02-06 NOTE — Telephone Encounter (Signed)
Faxed PA for Eliquis 5mg BID to [facility] 

## 2013-02-07 ENCOUNTER — Telehealth: Payer: Self-pay | Admitting: Internal Medicine

## 2013-02-07 ENCOUNTER — Telehealth: Payer: Self-pay | Admitting: *Deleted

## 2013-02-07 NOTE — Telephone Encounter (Signed)
Returning your call. °

## 2013-02-07 NOTE — Telephone Encounter (Signed)
Called patient to inform that Eliquis had been approved thru insurance comp

## 2013-02-07 NOTE — Telephone Encounter (Signed)
PA for Eliquis 5mg  BID approved thru 02/07/2014 under Medicare Part D

## 2013-02-18 ENCOUNTER — Encounter: Payer: Self-pay | Admitting: Physician Assistant

## 2013-02-18 ENCOUNTER — Ambulatory Visit (INDEPENDENT_AMBULATORY_CARE_PROVIDER_SITE_OTHER): Payer: Medicare Other | Admitting: Physician Assistant

## 2013-02-18 VITALS — BP 120/70 | HR 60 | Ht 74.0 in | Wt 274.0 lb

## 2013-02-18 DIAGNOSIS — Z7901 Long term (current) use of anticoagulants: Secondary | ICD-10-CM

## 2013-02-18 DIAGNOSIS — E669 Obesity, unspecified: Secondary | ICD-10-CM

## 2013-02-18 DIAGNOSIS — I10 Essential (primary) hypertension: Secondary | ICD-10-CM

## 2013-02-18 NOTE — Assessment & Plan Note (Signed)
Patient is exercising daily upwards of 2 hours per day. I did offer her referral for dietary counseling

## 2013-02-18 NOTE — Patient Instructions (Signed)
Followup with Dr. Hilty in 6 months 

## 2013-02-18 NOTE — Progress Notes (Signed)
Date:  02/18/2013   ID:  Nicholas Lynn, DOB 1945-03-30, MRN [id number]  PCP:  [name], MD  Primary Cardiologist:  HIlty     History of Present Illness: Nicholas Lynn is a 68 y.o. male has a history of coronary artery disease with previous anterior infarction in 1988 treated with angioplasty. He had bypass grafting in 1996 and then redo bypass grafting in August of 2013. His grafts are detailed in the problem list. He had done well since bypass did have atrial fibrillation was on warfarin until January of this year as well as amiodarone. In January both amiodarone and warfarin were discontinued. He completed rehabilitation and had felt well without recurrence of arrhythmia. He was in his usual state of health but around 5 PM the day of admission after getting up from the bathroom noted the onset of rapid palpitations and irregular heartbeat. He came to the emergency room where he was found to be in rapid atrial fibrillation. He was given metoprolol as well as diltiazem with some slowing of his heart rate. He did not have any angina and denied symptoms of heart failure. He had no PND, orthopnea or significant edema. He has no significant claudication.   He was admitted and started on Eliquis and amiodarone. TEE/DCCv was arranged and completed on 12/25/12. He converted without complications. 2D echo on 12/23/12 revealed and EF of 35-45% with wall motion abnormalities. Amiodarone was. decreased by Dr. Loleta Chance to 200 mg daily.  Patient presents today for a followup blood pressure check after being started on Diovan HCT. He currently has no complaints and denies nausea, vomiting, fever, chest pain, shortness of breath, orthopnea, dizziness, PND, cough, congestion, abdominal pain, hematochezia, melena, lower extremity edema, claudication.  He reports exercising for ~2 hours per day.   Wt Readings from Last 3 Encounters:  02/18/13 274 lb (124.286 kg)  02/04/13 276 lb 1.6 oz (125.238 kg)  01/07/13 271  lb (122.925 kg)     Past Medical History  Diagnosis Date  . Hyperlipidemia   . Hypertension   . Hernia     umbilical  . GERD (gastroesophageal reflux disease)   . Arthritis   . Coronary artery disease 1998; 1996; 2013    PTCA LCX; CABG X 4; RE-do CABG  . Hx of myocardial infarction 1983    medically treated   . OSA on CPAP   . COPD (chronic obstructive pulmonary disease)     mild on singulair  . A-fib     Current Outpatient Prescriptions  Medication Sig Dispense Refill  . amiodarone (PACERONE) 400 MG tablet Take 0.5 tablets (200 mg total) by mouth daily.      Marland Kitchen apixaban (ELIQUIS) 5 MG TABS tablet Take 1 tablet (5 mg total) by mouth 2 (two) times daily.  60 tablet  5  . aspirin EC 81 MG tablet Take 81 mg by mouth daily.      Marland Kitchen atorvastatin (LIPITOR) 10 MG tablet Take 10 mg by mouth daily.      Marland Kitchen Bioflavonoid Products (ESTER C PO) Take 500 mg by mouth daily.        . cetirizine (ZYRTEC) 10 MG tablet Take 10 mg by mouth daily.        . Cholecalciferol (VITAMIN D-3 PO) Take 1,000 Units by mouth 2 (two) times daily.       Marland Kitchen ezetimibe (ZETIA) 10 MG tablet Take 10 mg by mouth daily.      . fluticasone (VERAMYST) 27.5 MCG/SPRAY  nasal spray Place 1 spray into the nose daily. For allergies      . folic acid (FOLVITE) 1 MG tablet Take 1 mg by mouth daily.      . iron polysaccharides (NIFEREX) 150 MG capsule Take 1 capsule (150 mg total) by mouth daily.  30 capsule  5  . metoprolol (LOPRESSOR) 50 MG tablet Take 25 mg by mouth 2 (two) times daily.      . Misc Natural Products (OSTEO BI-FLEX ADV TRIPLE ST PO) Take 1 tablet by mouth daily.       . montelukast (SINGULAIR) 10 MG tablet Take 10 mg by mouth at bedtime.       . Multiple Vitamin (MULTIVITAMIN WITH MINERALS) TABS Take 1 tablet by mouth daily.      . niacin (NIASPAN) 1000 MG CR tablet Take 1,000 mg by mouth at bedtime.        . Niacin CR 1000 MG TBCR       . nitroGLYCERIN (NITROSTAT) 0.4 MG SL tablet Place 0.4 mg under the tongue  every 5 (five) minutes as needed for chest pain.      . Omega-3 Fatty Acids (FISH OIL PO) Take 1,000 mg by mouth 2 (two) times daily.        Marland Kitchen. omeprazole (PRILOSEC) 20 MG capsule Take 20 mg by mouth daily.       . valsartan-hydrochlorothiazide (DIOVAN-HCT) 160-12.5 MG per tablet Take 1 tablet by mouth daily.  30 tablet  6   No current facility-administered medications for this visit.    Allergies:    Allergies  Allergen Reactions  . Sulfa Antibiotics Swelling    Social History:  The patient  reports that he quit smoking about 26 years ago. He has never used smokeless tobacco. He reports that he does not drink alcohol or use illicit drugs.   Family history:   Family History  Problem Relation Age of Onset  . Stroke Mother   . Cancer Father     lung  . Cancer Sister     lung  . Cancer Sister     liver    ROS:  Please see the history of present illness.  All other systems reviewed and negative.   PHYSICAL EXAM: VS:  BP 120/70  Pulse 60  Ht 6\' 2"  (1.88 m)  Wt 274 lb (124.286 kg)  BMI 35.16 kg/m2 Obese, well developed, in no acute distress HEENT: Pupils are equal round react to light accommodation extraocular movements are intact.  Neck: no JVD Cardiac: Regular rate and rhythm without murmurs rubs or gallops. Lungs:  clear to auscultation bilaterally, no wheezing, rhonchi or rales Ext: no lower extremity edema.  2+ radial and dorsalis pedis pulses. Skin: warm and dry Neuro:  Grossly normal    ASSESSMENT AND PLAN:  Problem List Items Addressed This Visit   HTN (hypertension) - Primary (Chronic)     Blood pressure is well controlled on added Diovan/HCTZ. We'll have the patient follow up in 6 months or sooner if needed.    Obesity (BMI 30-39.9) (Chronic)     Patient is exercising daily upwards of 2 hours per day. I did offer her referral for dietary counseling    Chronic anticoagulation (Chronic)

## 2013-02-18 NOTE — Assessment & Plan Note (Signed)
Blood pressure is well controlled on added Diovan/HCTZ. We'll have the patient follow up in 6 months or sooner if needed.

## 2013-03-25 ENCOUNTER — Other Ambulatory Visit: Payer: Self-pay

## 2013-03-25 MED ORDER — NIACIN ER (ANTIHYPERLIPIDEMIC) 1000 MG PO TBCR: 1000.0000 mg | EXTENDED_RELEASE_TABLET | Freq: Every day | ORAL | Status: DC

## 2013-03-25 NOTE — Telephone Encounter (Signed)
Rx was sent to pharmacy electronically. 

## 2013-04-02 IMAGING — CR DG CHEST 1V PORT
1 series · 1 of 1 positions shown · non-contrast
Comparison: Chest x-ray of 09/08/2011

CLINICAL DATA: Postop CABG, two needles missing from count

PORTABLE CHEST - 1 VIEW

[AP]
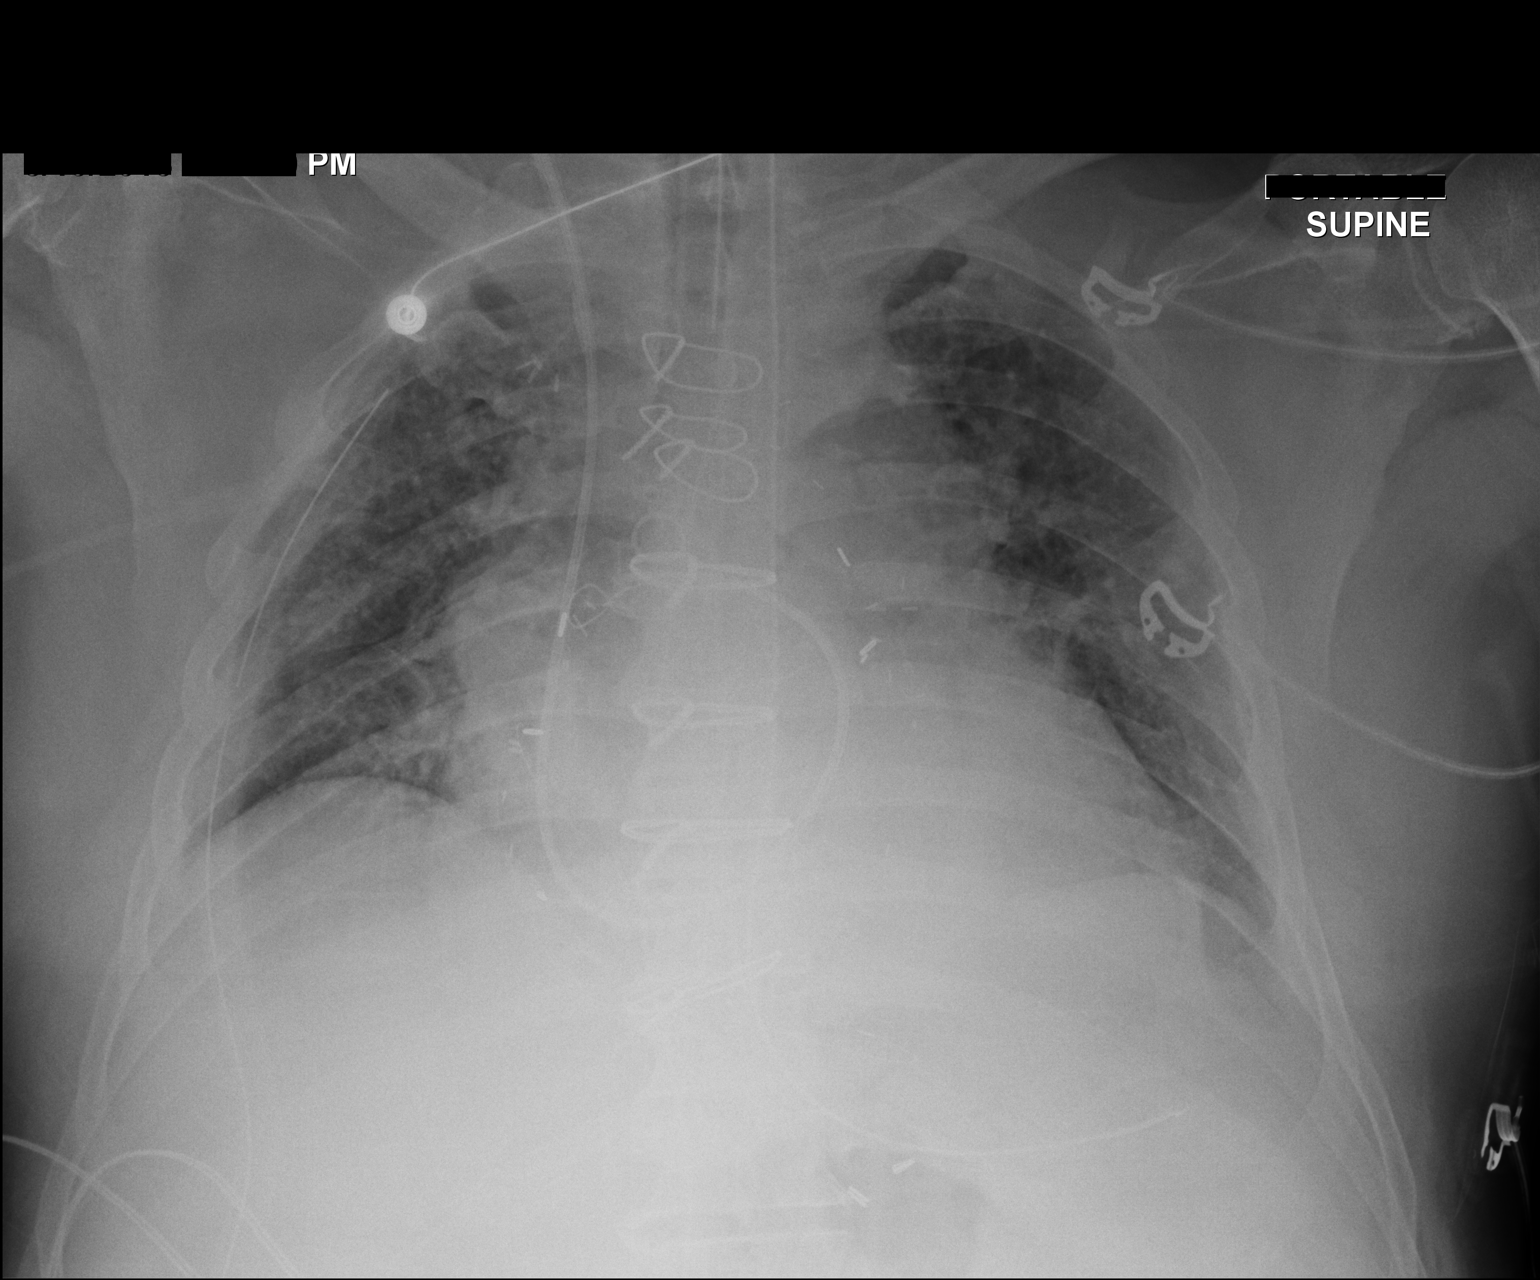

[1 of 1 positions shown; findings below may reference images not displayed]

FINDINGS: The tip of the endotracheal tube is approximately 4.8 cm
above the carina.  The Swan-Ganz catheter is present with the tip
in the right main pulmonary artery A right chest tube is noted with
no pneumothorax.  The lungs are not well aerated with perhaps mild
pulmonary vascular congestion present.  Cardiomegaly is stable.  No
definite retained needle is seen.  Two thin filament- like wires
overlie the right lower medial chest of questionable significance.
IMPRESSION: 1.  Poor aeration with cardiomegaly and probable mild pulmonary
vascular congestion.
2.  Tip of endotracheal tube is 4.8 cm above the carina.
3.  Right chest tube present.  No pneumothorax.
4.  Two thin metallic wires overlie the lower medial right chest of
questionable significance.

## 2013-04-03 IMAGING — CR DG CHEST 1V PORT
1 series · 1 of 1 positions shown · non-contrast
Comparison: Portable exam 3983 hours compared to 09/11/2011

CLINICAL DATA: Post redo coronary artery bypass grafting

PORTABLE CHEST - 1 VIEW

[AP]
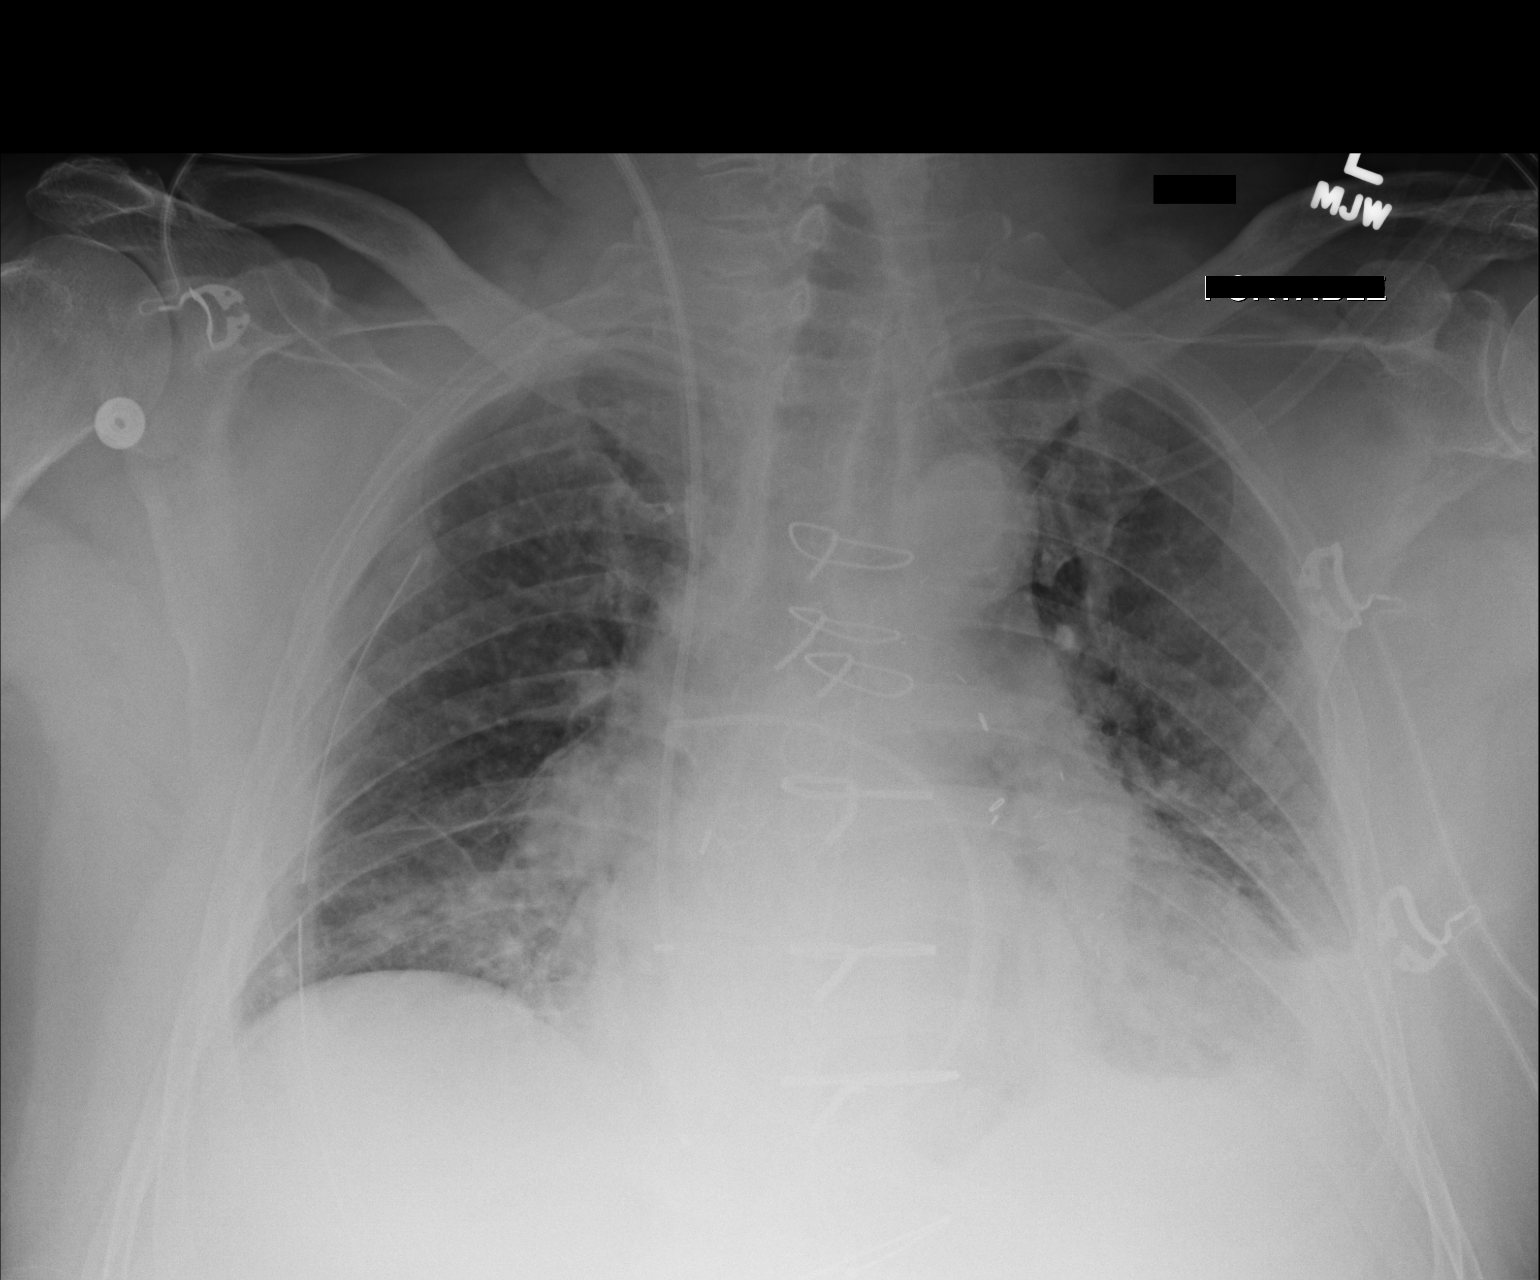

[1 of 1 positions shown; findings below may reference images not displayed]

FINDINGS: Interval removal of endotracheal and nasogastric tubes.
Right jugular Swan-Ganz catheter with tip projecting over right
pulmonary artery at right hilum.
Enlargement of cardiac silhouette post CABG.
Pulmonary vascular congestion.
Bibasilar opacities likely representing atelectasis.
Small left pleural effusion.
Right thoracostomy tube unchanged.
No pneumothorax.
IMPRESSION: Left pleural effusion and probable bibasilar atelectasis.

## 2013-04-05 IMAGING — CR DG CHEST 1V PORT
1 series · 1 of 1 positions shown · non-contrast
Comparison: 09/13/2011

CLINICAL DATA: CABG procedure.

PORTABLE CHEST - 1 VIEW

[AP]
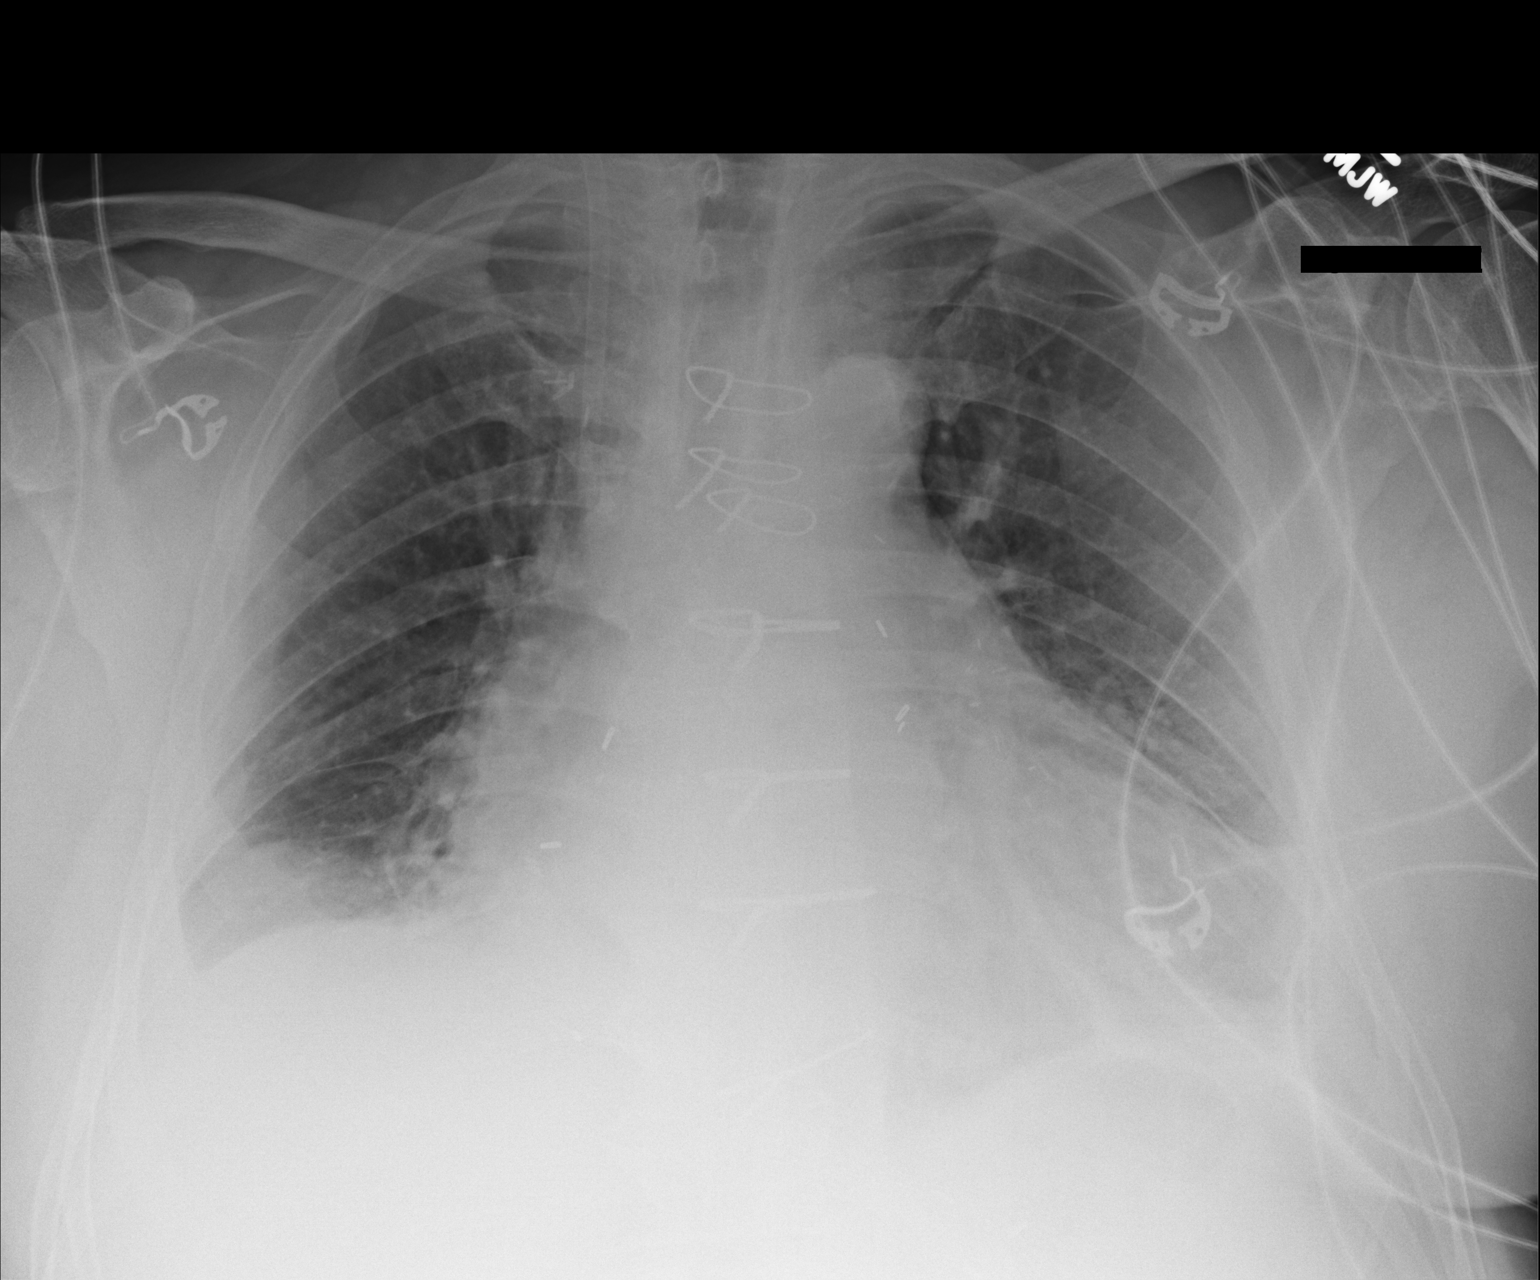

[1 of 1 positions shown; findings below may reference images not displayed]

FINDINGS: The cardiac silhouette is enlarged.  Right jugular
central venous catheter appears stable.  Persistent bibasilar
densities are suggestive for atelectasis and small effusions.  No
evidence for a pneumothorax.  There may be a small focus of
airspace disease in the left upper lung but this could also
represent overlying structures.
IMPRESSION: Persistent basilar densities are suggestive for atelectasis and
small effusions.  Cannot exclude a small focus of airspace disease
in the left upper lung.

Cardiomegaly.

## 2013-04-15 ENCOUNTER — Other Ambulatory Visit: Payer: Self-pay | Admitting: Cardiovascular Disease

## 2013-04-15 NOTE — Telephone Encounter (Signed)
Rx was sent to pharmacy electronically. 

## 2013-04-23 ENCOUNTER — Other Ambulatory Visit: Payer: Self-pay

## 2013-04-23 MED ORDER — FOLIC ACID 1 MG PO TABS: 1.0000 mg | ORAL_TABLET | Freq: Every day | ORAL | Status: DC

## 2013-04-23 NOTE — Telephone Encounter (Signed)
Rx was sent to pharmacy electronically. 

## 2013-06-17 ENCOUNTER — Other Ambulatory Visit: Payer: Self-pay | Admitting: Cardiovascular Disease

## 2013-06-17 NOTE — Telephone Encounter (Signed)
Rx was sent to pharmacy electronically. 

## 2013-07-04 ENCOUNTER — Other Ambulatory Visit (HOSPITAL_COMMUNITY): Payer: Self-pay | Admitting: Internal Medicine

## 2013-07-04 ENCOUNTER — Other Ambulatory Visit: Payer: Self-pay | Admitting: *Deleted

## 2013-07-04 NOTE — Telephone Encounter (Signed)
Patient has called regarding refills for Amiodarone 200 mg # 60 1 daily and Eliquis 5 mg # 60  1 BID

## 2013-07-04 NOTE — Telephone Encounter (Signed)
eliquis refill

## 2013-07-07 ENCOUNTER — Other Ambulatory Visit: Payer: Self-pay | Admitting: *Deleted

## 2013-07-07 MED ORDER — AMIODARONE HCL 400 MG PO TABS: 200.0000 mg | ORAL_TABLET | Freq: Every day | ORAL | Status: DC

## 2013-07-08 ENCOUNTER — Other Ambulatory Visit: Payer: Self-pay | Admitting: Pharmacist Clinician (PhC)/ Clinical Pharmacy Specialist

## 2013-07-08 MED ORDER — APIXABAN 5 MG PO TABS: ORAL_TABLET | ORAL | Status: DC

## 2013-08-18 ENCOUNTER — Emergency Department (INDEPENDENT_AMBULATORY_CARE_PROVIDER_SITE_OTHER): Payer: Medicare Other

## 2013-08-18 ENCOUNTER — Emergency Department (HOSPITAL_COMMUNITY)
Admission: EM | Admit: 2013-08-18 | Discharge: 2013-08-18 | Disposition: A | Discharge status: Home / Self Care | Payer: Medicare Other | Source: Home / Self Care

## 2013-08-18 ENCOUNTER — Encounter (HOSPITAL_COMMUNITY): Payer: Self-pay | Admitting: Emergency Medicine

## 2013-08-18 DIAGNOSIS — R509 Fever, unspecified: Secondary | ICD-10-CM

## 2013-08-18 LAB — POCT I-STAT, CHEM 8
BUN: 11 mg/dL (ref 6–23)
Calcium, Ion: 1.16 mmol/L (ref 1.13–1.30)
Chloride: 101 mEq/L (ref 96–112)
Creatinine, Ser: 1.1 mg/dL (ref 0.50–1.35)
Glucose, Bld: 130 mg/dL — ABNORMAL HIGH (ref 70–99)
HEMATOCRIT: 46 % (ref 39.0–52.0)
Hemoglobin: 15.6 g/dL (ref 13.0–17.0)
Potassium: 3.8 mEq/L (ref 3.7–5.3)
SODIUM: 139 meq/L (ref 137–147)
TCO2: 25 mmol/L (ref 0–100)

## 2013-08-18 LAB — POCT URINALYSIS DIP (DEVICE)
Bilirubin Urine: NEGATIVE
GLUCOSE, UA: NEGATIVE mg/dL
Hgb urine dipstick: NEGATIVE
Ketones, ur: NEGATIVE mg/dL
LEUKOCYTES UA: NEGATIVE
NITRITE: NEGATIVE
PROTEIN: 30 mg/dL — AB
Specific Gravity, Urine: 1.02 (ref 1.005–1.030)
UROBILINOGEN UA: 0.2 mg/dL (ref 0.0–1.0)
pH: 6 (ref 5.0–8.0)

## 2013-08-18 LAB — CBC
HCT: 43 % (ref 39.0–52.0)
Hemoglobin: 14.3 g/dL (ref 13.0–17.0)
MCH: 30.9 pg (ref 26.0–34.0)
MCHC: 33.3 g/dL (ref 30.0–36.0)
MCV: 92.9 fL (ref 78.0–100.0)
PLATELETS: 144 10*3/uL — AB (ref 150–400)
RBC: 4.63 MIL/uL (ref 4.22–5.81)
RDW: 14 % (ref 11.5–15.5)
WBC: 13.4 10*3/uL — ABNORMAL HIGH (ref 4.0–10.5)

## 2013-08-18 NOTE — Discharge Instructions (Signed)
Your blood work, chest xray and urine studies were without source of fever. You are likely experiencing a viral illness and this is a body's normal response (fever) to fighting illness. Please continue to take tylenol as needed and as directed on packaging for fever. Plenty of fluids and rest. I would contact your primary care doctor tomorrow to make her aware of your illness and arrange follow up evaluation to see if she would like to perform any additional testing. Please take copies of your results to follow up visit. Certainly if symptoms become suddenly worse or severe, you should report to your nearest ER for re-evaluation.

## 2013-08-18 NOTE — ED Provider Notes (Signed)
CSN: [id number]     Arrival date & time 08/18/13  1910 History   First MD Initiated Contact with Patient 08/18/13 1944     No chief complaint on file.  (Consider location/radiation/quality/duration/timing/severity/associated sxs/prior Treatment) HPI Comments: Patient reports two days of fever with Tmax of 103.0 today. States when temperature is elevated, he gets a mild headache and mild dizziness. States when he takes tylenol and fever reduces, his headache and dizziness resolve. Denies recent tick bite or travel. No known ill contacts.  PCP: C. Mardelle Matte  The history is provided by the patient.    Past Medical History  Diagnosis Date  . Hyperlipidemia   . Hypertension   . Hernia     umbilical  . GERD (gastroesophageal reflux disease)   . Arthritis   . Coronary artery disease 1998; 1996; 2013    PTCA LCX; CABG X 4; RE-do CABG  . Hx of myocardial infarction 1983    medically treated   . OSA on CPAP   . COPD (chronic obstructive pulmonary disease)     mild on singulair  . A-fib    Past Surgical History  Procedure Laterality Date  . Angioplasty  1993    LCX  . Cardiac catheterization  2008;2013  . Cataract extraction    . Umbilical hernia repair  04/04/2011    Procedure: HERNIA REPAIR UMBILICAL ADULT;  Surgeon: Adolph Pollack, MD;  Location: [facility] ORS;  Service: General;  Laterality: N/A;  Umbilical Hernia Repair  . Tee without cardioversion  09/11/2011    Procedure: TRANSESOPHAGEAL ECHOCARDIOGRAM (TEE);  Surgeon: Delight Ovens, MD;  Location: [facility] OR;  Service: Open Heart Surgery;  Laterality: N/A;  . Coronary artery bypass graft  1996    3 VESSELS  . Coronary artery bypass graft  09/11/2011    Procedure: REDO CORONARY ARTERY BYPASS GRAFTING (CABG);  Surgeon: Delight Ovens, MD;  Location[facility];  Service: Open Heart Surgery;  Laterality: N/A;  Redo Coronary Artery Bypass Graft times three utilizing the right internal mammary artery and the left  and right greater saphenous  veins.  Rhae Hammock without cardioversion N/A 12/25/2012    Procedure: TRANSESOPHAGEAL ECHOCARDIOGRAM (TEE);  Surgeon: Chrystie Nose, MD;  Location: [facility] ENDOSCOPY;  Service: Cardiovascular;  Laterality: N/A;  . Cardioversion N/A 12/25/2012    Procedure: CARDIOVERSION;  Surgeon: Chrystie Nose, MD;  Location: [facility] ENDOSCOPY;  Service: Cardiovascular;  Laterality: N/A;   Family History  Problem Relation Age of Onset  . Stroke Mother   . Cancer Father     lung  . Cancer Sister     lung  . Cancer Sister     liver   History  Substance Use Topics  . Smoking status: Former Smoker    Quit date: 03/27/1986  . Smokeless tobacco: Never Used  . Alcohol Use: No    Review of Systems  Constitutional: Positive for fever and chills. Negative for appetite change.  HENT: Negative for congestion, mouth sores, postnasal drip, rhinorrhea, sinus pressure, sneezing and sore throat.   Eyes: Negative.   Respiratory: Negative for cough, chest tightness and wheezing.   Cardiovascular: Negative.   Gastrointestinal: Negative for nausea, vomiting, abdominal pain, diarrhea, constipation and blood in stool.  Genitourinary: Negative for dysuria, urgency, frequency, hematuria, flank pain, decreased urine volume, discharge, penile swelling, scrotal swelling, difficulty urinating, genital sores, penile pain and testicular pain.  Musculoskeletal: Negative for arthralgias, back pain, joint swelling, myalgias, neck pain and neck stiffness.  Skin: Negative.  Allergic/Immunologic: Negative for immunocompromised state.  Neurological: Positive for dizziness and headaches. Negative for tremors, seizures, syncope, speech difficulty, weakness and light-headedness.  Hematological: Negative for adenopathy.  Psychiatric/Behavioral: Negative.     Allergies  Sulfa antibiotics  Home Medications   Prior to Admission medications   Medication Sig Start Date End Date Taking? Authorizing Provider  amiodarone (PACERONE) 400 MG  tablet Take 0.5 tablets (200 mg total) by mouth daily. 07/07/13   Wilburt FinlayBryan Hager, PA-C  apixaban (ELIQUIS) 5 MG TABS tablet take 1 tablet by mouth twice a day 07/08/13   Chrystie NoseKenneth C. Hilty, MD  aspirin EC 81 MG tablet Take 81 mg by mouth daily.    Historical Provider, MD  atorvastatin (LIPITOR) 10 MG tablet Take 10 mg by mouth daily.    Historical Provider, MD  Bioflavonoid Products (ESTER C PO) Take 500 mg by mouth daily.      Historical Provider, MD  cetirizine (ZYRTEC) 10 MG tablet Take 10 mg by mouth daily.      Historical Provider, MD  Cholecalciferol (VITAMIN D-3 PO) Take 1,000 Units by mouth 2 (two) times daily.     Historical Provider, MD  ezetimibe (ZETIA) 10 MG tablet Take 10 mg by mouth daily.    Historical Provider, MD  FERREX 150 150 MG capsule TAKE 1 CAPSULE BY MOUTH DAILY 06/17/13   Chrystie NoseKenneth C. Hilty, MD  fluticasone (VERAMYST) 27.5 MCG/SPRAY nasal spray Place 1 spray into the nose daily. For allergies    Historical Provider, MD  folic acid (FOLVITE) 1 MG tablet Take 1 tablet (1 mg total) by mouth daily. 04/23/13   Chrystie NoseKenneth C. Hilty, MD  metoprolol (LOPRESSOR) 50 MG tablet Take 25 mg by mouth 2 (two) times daily. 12/25/12   Wilburt FinlayBryan Hager, PA-C  Misc Natural Products (OSTEO BI-FLEX ADV TRIPLE ST PO) Take 1 tablet by mouth daily.     Historical Provider, MD  montelukast (SINGULAIR) 10 MG tablet Take 10 mg by mouth at bedtime.     Historical Provider, MD  Multiple Vitamin (MULTIVITAMIN WITH MINERALS) TABS Take 1 tablet by mouth daily.    Historical Provider, MD  niacin (NIASPAN) 1000 MG CR tablet Take 1 tablet (1,000 mg total) by mouth at bedtime. 03/25/13   Chrystie NoseKenneth C. Hilty, MD  Niacin CR 1000 MG TBCR  02/14/13   Historical Provider, MD  nitroGLYCERIN (NITROSTAT) 0.4 MG SL tablet Place 0.4 mg under the tongue every 5 (five) minutes as needed for chest pain.    Historical Provider, MD  Omega-3 Fatty Acids (FISH OIL PO) Take 1,000 mg by mouth 2 (two) times daily.      Historical Provider, MD  omeprazole  (PRILOSEC) 20 MG capsule Take 20 mg by mouth daily.     Historical Provider, MD  valsartan-hydrochlorothiazide (DIOVAN-HCT) 160-12.5 MG per tablet Take 1 tablet by mouth daily. 02/04/13   Chrystie NoseKenneth C. Hilty, MD  ZETIA 10 MG tablet take 1 tablet by mouth once daily 04/15/13   Chrystie NoseKenneth C. Hilty, MD   BP 119/47  Pulse 70  Temp(Src) 100.8 F (38.2 C) (Oral)  SpO2 94% Physical Exam  Nursing note and vitals reviewed. Constitutional: He is oriented to person, place, and time. He appears well-developed and well-nourished. No distress.  HENT:  Head: Normocephalic and atraumatic.  Right Ear: Hearing, tympanic membrane, external ear and ear canal normal. No mastoid tenderness.  Left Ear: Hearing, tympanic membrane, external ear and ear canal normal. No mastoid tenderness.  Nose: Nose normal.  Mouth/Throat: Uvula is midline, oropharynx  is clear and moist and mucous membranes are normal. No oral lesions. No trismus in the jaw.  Eyes: Conjunctivae are normal. No scleral icterus.  Neck: Normal range of motion, full passive range of motion without pain and phonation normal. Neck supple. No muscular tenderness present. No rigidity. No thyromegaly present.  Cardiovascular: Normal rate, regular rhythm and normal heart sounds.   Pulmonary/Chest: Effort normal and breath sounds normal. No respiratory distress. He has no wheezes.  Abdominal: Soft. Bowel sounds are normal. He exhibits no distension and no mass. There is no tenderness. There is no CVA tenderness.  Musculoskeletal: Normal range of motion. He exhibits no edema and no tenderness.  Lymphadenopathy:    He has no cervical adenopathy.  Neurological: He is alert and oriented to person, place, and time.  Skin: Skin is warm and dry. No rash noted. No erythema.  Psychiatric: He has a normal mood and affect. His behavior is normal.    ED Course  Procedures (including critical care time) Labs Review Labs Reviewed  CBC - Abnormal; Notable for the following:     WBC 13.4 (*)    Platelets 144 (*)    All other components within normal limits  POCT URINALYSIS DIP (DEVICE) - Abnormal; Notable for the following:    Protein, ur 30 (*)    All other components within normal limits  POCT I-STAT, CHEM 8 - Abnormal; Notable for the following:    Glucose, Bld 130 (*)    All other components within normal limits    Imaging Review Dg Chest 2 View  08/18/2013   CLINICAL DATA:  Fever and headache for 2 days.  EXAM: CHEST  2 VIEW  COMPARISON:  PA and lateral chest 12/23/2012 and 10/19/2011.  FINDINGS: The patient is status post CABG. Heart size is mildly enlarged. Lungs are clear. No pneumothorax or pleural effusion.  IMPRESSION: Cardiomegaly without acute disease.   Electronically Signed   By: Drusilla Kanner M.D.   On: 08/18/2013 20:50     MDM   1. Febrile illness, acute    UA unremarkable for source of fever CXR unremarkable for source of fever Istat with mild hyperglycemia, likely response to current illness CBC with mild leukocytosis and mild thrombocytopenia Exam without specific source of fever. No clinical indication of meningitis. Will advise patient of results and advise plenty of fluids and rest with continued use of tylenol for fever. Advised to contact his PCP tomorrow for follow up. Instructed to report to his nearest ER if symptoms become suddenly worse or severe.     Jess Barters [place], [place] 08/18/13 2128

## 2013-08-19 ENCOUNTER — Other Ambulatory Visit: Payer: Self-pay | Admitting: *Deleted

## 2013-08-19 MED ORDER — METOPROLOL TARTRATE 50 MG PO TABS: 25.0000 mg | ORAL_TABLET | Freq: Two times a day (BID) | ORAL | Status: DC

## 2013-08-19 NOTE — ED Provider Notes (Signed)
Medical screening examination/treatment/procedure(s) were performed by a resident physician or non-physician practitioner and as the supervising physician I was immediately available for consultation/collaboration.  Shelly Flatten, MD Family Medicine   Ozella Rocks, MD 08/19/13 2129

## 2013-08-20 ENCOUNTER — Encounter: Payer: Self-pay | Admitting: Internal Medicine

## 2013-08-20 ENCOUNTER — Ambulatory Visit (INDEPENDENT_AMBULATORY_CARE_PROVIDER_SITE_OTHER): Payer: Medicare Other | Admitting: Internal Medicine

## 2013-08-20 VITALS — BP 124/62 | HR 67 | Ht 74.0 in | Wt 268.2 lb

## 2013-08-20 DIAGNOSIS — I2510 Atherosclerotic heart disease of native coronary artery without angina pectoris: Secondary | ICD-10-CM

## 2013-08-20 DIAGNOSIS — I10 Essential (primary) hypertension: Secondary | ICD-10-CM

## 2013-08-20 DIAGNOSIS — I2589 Other forms of chronic ischemic heart disease: Secondary | ICD-10-CM

## 2013-08-20 DIAGNOSIS — I209 Angina pectoris, unspecified: Secondary | ICD-10-CM

## 2013-08-20 DIAGNOSIS — I255 Ischemic cardiomyopathy: Secondary | ICD-10-CM

## 2013-08-20 DIAGNOSIS — I25119 Atherosclerotic heart disease of native coronary artery with unspecified angina pectoris: Secondary | ICD-10-CM

## 2013-08-20 DIAGNOSIS — G4730 Sleep apnea, unspecified: Secondary | ICD-10-CM

## 2013-08-20 DIAGNOSIS — I4891 Unspecified atrial fibrillation: Secondary | ICD-10-CM

## 2013-08-20 DIAGNOSIS — R0989 Other specified symptoms and signs involving the circulatory and respiratory systems: Principal | ICD-10-CM

## 2013-08-20 DIAGNOSIS — R0609 Other forms of dyspnea: Secondary | ICD-10-CM

## 2013-08-20 DIAGNOSIS — I480 Paroxysmal atrial fibrillation: Secondary | ICD-10-CM

## 2013-08-20 DIAGNOSIS — E669 Obesity, unspecified: Secondary | ICD-10-CM

## 2013-08-20 MED ORDER — AMIODARONE HCL 200 MG PO TABS: 200.0000 mg | ORAL_TABLET | Freq: Every day | ORAL | Status: DC

## 2013-08-20 NOTE — Progress Notes (Signed)
OFFICE NOTE  Chief Complaint:  Hospital follow-up  Primary Care Physician: [name], MD  HPI:  Nicholas Lynn is a 68 year old male has a history of coronary artery disease with previous anterior infarction in 1988 treated with angioplasty. He had bypass grafting in 1996 and then redo bypass grafting in August of 2013. His grafts are detailed in the problem list. He had done well since bypass did have atrial fibrillation was on warfarin until January of this year as well as amiodarone. In January both amiodarone and warfarin were discontinued. He completed rehabilitation and had felt well without recurrence of arrhythmia. He was in his usual state of health but around 5 PM the day of admission after getting up from the bathroom noted the onset of rapid palpitations and irregular heartbeat. He came to the emergency room where he was found to be in rapid atrial fibrillation. He was given metoprolol as well as diltiazem with some slowing of his heart rate. He did not have any angina and denied symptoms of heart failure. He had no PND, orthopnea or significant edema. He has no significant claudication. He was admitted and started on Eliquis and amiodarone. TEE/DCCv was arranged and completed on 12/25/12. He converted without complications. 2D echo on 12/23/12 revealed and EF of 35-45% with wall motion abnormalities. Amiodarone 400mg  for seven days total then decreased to 400mg  daily. He recently saw [name], PA-C in followup and was noted to be bradycardic. His amiodarone was further decreased to 200 mg daily.  Despite his bradycardia with heart rate in the 40s, he reportedly is asymptomatic and is able to do most activities without much problem. He has noted however that his blood pressure continues to creep up somewhat and has been in the 150s or 160s systolic. At his last office visit I started him on Diovan HCTZ and he subsequently followed up with [name] and his blood pressure was much better  controlled.  He's done generally well since that time however today in followup he reports he's been having recent fevers up to 10 3 at night. This is associated with some night sweats and shaking chills. It seems to be cyclical. He was seen in urgent care workup for possible tick bites in [facility] spotted fever. This was negative. He is continue to followup with his primary care provider for additional workup as to possible causes of his constitutional symptoms.  PMHx:  Past Medical History  Diagnosis Date  . Hyperlipidemia   . Hypertension   . Hernia     umbilical  . GERD (gastroesophageal reflux disease)   . Arthritis   . Coronary artery disease 1998; 1996; 2013    PTCA LCX; CABG X 4; RE-do CABG  . Hx of myocardial infarction 1983    medically treated   . OSA on CPAP   . COPD (chronic obstructive pulmonary disease)     mild on singulair  . A-fib     Past Surgical History  Procedure Laterality Date  . Angioplasty  1993    LCX  . Cardiac catheterization  2008;2013  . Cataract extraction    . Umbilical hernia repair  04/04/2011    Procedure: HERNIA REPAIR UMBILICAL ADULT;  Surgeon: Adolph Pollack, MD;  Location: [facility] ORS;  Service: General;  Laterality: N/A;  Umbilical Hernia Repair  . Tee without cardioversion  09/11/2011    Procedure: TRANSESOPHAGEAL ECHOCARDIOGRAM (TEE);  Surgeon: Delight Ovens, MD;  Location: [facility] OR;  Service: Open Heart Surgery;  Laterality: N/A;  . Coronary artery bypass graft  1996    3 VESSELS  . Coronary artery bypass graft  09/11/2011    Procedure: REDO CORONARY ARTERY BYPASS GRAFTING (CABG);  Surgeon: Delight OvensEdward B Gerhardt, MD;  [facility];  Service: Open Heart Surgery;  Laterality: N/A;  Redo Coronary Artery Bypass Graft times three utilizing the right internal mammary artery and the left  and right greater saphenous veins.  Rhae Hammock. Tee without cardioversion N/A 12/25/2012    Procedure: TRANSESOPHAGEAL ECHOCARDIOGRAM (TEE);  Surgeon: Chrystie NoseKenneth C. Hilty,  MD;  [facility];  Service: Cardiovascular;  Laterality: N/A;  . Cardioversion N/A 12/25/2012    Procedure: CARDIOVERSION;  Surgeon: Chrystie NoseKenneth C. Hilty, MD;  [facility];  Service: Cardiovascular;  Laterality: N/A;    FAMHx:  Family History  Problem Relation Age of Onset  . Stroke Mother   . Cancer Father     lung  . Cancer Sister     lung  . Cancer Sister     liver    SOCHx:   reports that he quit smoking about 27 years ago. He has never used smokeless tobacco. He reports that he does not drink alcohol or use illicit drugs.  ALLERGIES:  Allergies  Allergen Reactions  . Sulfa Antibiotics Swelling    ROS: A comprehensive review of systems was negative except for: Constitutional: positive for chills, fevers and night sweats  HOME MEDS: Current Outpatient Prescriptions  Medication Sig Dispense Refill  . amiodarone (PACERONE) 200 MG tablet Take 1 tablet (200 mg total) by mouth daily.  30 tablet  11  . apixaban (ELIQUIS) 5 MG TABS tablet take 1 tablet by mouth twice a day  60 tablet  5  . aspirin EC 81 MG tablet Take 81 mg by mouth daily.      Marland Kitchen. atorvastatin (LIPITOR) 10 MG tablet Take 10 mg by mouth daily.      Marland Kitchen. Bioflavonoid Products (ESTER C PO) Take 500 mg by mouth daily.        . cetirizine (ZYRTEC) 10 MG tablet Take 10 mg by mouth daily.        . Cholecalciferol (VITAMIN D-3 PO) Take 1,000 Units by mouth 2 (two) times daily.       Marland Kitchen. FERREX 150 150 MG capsule TAKE 1 CAPSULE BY MOUTH DAILY  30 capsule  8  . fluticasone (VERAMYST) 27.5 MCG/SPRAY nasal spray Place 1 spray into the nose daily. For allergies      . folic acid (FOLVITE) 1 MG tablet Take 1 tablet (1 mg total) by mouth daily.  30 tablet  9  . metoprolol (LOPRESSOR) 50 MG tablet Take 0.5 tablets (25 mg total) by mouth 2 (two) times daily.  60 tablet  3  . Misc Natural Products (OSTEO BI-FLEX ADV TRIPLE ST PO) Take 1 tablet by mouth daily.       . montelukast (SINGULAIR) 10 MG tablet Take 10 mg by  mouth at bedtime.       . Multiple Vitamin (MULTIVITAMIN WITH MINERALS) TABS Take 1 tablet by mouth daily.      . niacin (NIASPAN) 1000 MG CR tablet Take 1 tablet (1,000 mg total) by mouth at bedtime.  30 tablet  10  . nitroGLYCERIN (NITROSTAT) 0.4 MG SL tablet Place 0.4 mg under the tongue every 5 (five) minutes as needed for chest pain.      . Omega-3 Fatty Acids (FISH OIL PO) Take 1,000 mg by mouth 2 (two) times daily.        .Marland Kitchen  omeprazole (PRILOSEC) 20 MG capsule Take 20 mg by mouth daily.       . valsartan-hydrochlorothiazide (DIOVAN-HCT) 160-12.5 MG per tablet Take 1 tablet by mouth daily.  30 tablet  6  . ZETIA 10 MG tablet take 1 tablet by mouth once daily  30 tablet  5   No current facility-administered medications for this visit.    LABS/IMAGING: Results for orders placed during the hospital encounter of 08/18/13 (from the past 48 hour(s))  CBC     Status: Abnormal   Collection Time    08/18/13  8:25 PM      Result Value Ref Range   WBC 13.4 (*) 4.0 - 10.5 K/uL   RBC 4.63  4.22 - 5.81 MIL/uL   Hemoglobin 14.3  13.0 - 17.0 g/dL   HCT 16.1  09.6 - 04.5 %   MCV 92.9  78.0 - 100.0 fL   MCH 30.9  26.0 - 34.0 pg   MCHC 33.3  30.0 - 36.0 g/dL   RDW 40.9  81.1 - 91.4 %   Platelets 144 (*) 150 - 400 K/uL  POCT URINALYSIS DIP (DEVICE)     Status: Abnormal   Collection Time    08/18/13  8:37 PM      Result Value Ref Range   Glucose, UA NEGATIVE  NEGATIVE mg/dL   Bilirubin Urine NEGATIVE  NEGATIVE   Ketones, ur NEGATIVE  NEGATIVE mg/dL   Specific Gravity, Urine 1.020  1.005 - 1.030   Hgb urine dipstick NEGATIVE  NEGATIVE   pH 6.0  5.0 - 8.0   Protein, ur 30 (*) NEGATIVE mg/dL   Urobilinogen, UA 0.2  0.0 - 1.0 mg/dL   Nitrite NEGATIVE  NEGATIVE   Leukocytes, UA NEGATIVE  NEGATIVE   Comment: Biochemical Testing Only. Please order routine urinalysis from main lab if confirmatory testing is needed.  POCT I-STAT, CHEM 8     Status: Abnormal   Collection Time    08/18/13  8:37 PM        Result Value Ref Range   Sodium 139  137 - 147 mEq/L   Potassium 3.8  3.7 - 5.3 mEq/L   Chloride 101  96 - 112 mEq/L   BUN 11  6 - 23 mg/dL   Creatinine, Ser 7.82  0.50 - 1.35 mg/dL   Glucose, Bld 956 (*) 70 - 99 mg/dL   Calcium, Ion 2.13  0.86 - 1.30 mmol/L   TCO2 25  0 - 100 mmol/L   Hemoglobin 15.6  13.0 - 17.0 g/dL   HCT 57.8  46.9 - 62.9 %   Dg Chest 2 View  08/18/2013   CLINICAL DATA:  Fever and headache for 2 days.  EXAM: CHEST  2 VIEW  COMPARISON:  PA and lateral chest 12/23/2012 and 10/19/2011.  FINDINGS: The patient is status post CABG. Heart size is mildly enlarged. Lungs are clear. No pneumothorax or pleural effusion.  IMPRESSION: Cardiomegaly without acute disease.   Electronically Signed   By: Drusilla Kanner M.D.   On: 08/18/2013 20:50    VITALS: BP 124/62  Pulse 67  Ht 6\' 2"  (1.88 m)  Wt 268 lb 3.2 oz (121.655 kg)  BMI 34.42 kg/m2  EXAM: General appearance: alert and no distress Neck: no carotid bruit and no JVD Lungs: clear to auscultation bilaterally Heart: regular bradycardia Abdomen: soft, non-tender; bowel sounds normal; no masses,  no organomegaly Extremities: extremities normal, atraumatic, no cyanosis or edema Pulses: 2+ and symmetric Skin: Skin color, texture,  turgor normal. No rashes or lesions Neurologic: Grossly normal Psych: Mood, affect normal  EKG: Normal sinus rhythm at 67  ASSESSMENT: 1. Paroxysmal atrial fibrillation status post TEE/cardioversion - maintaining sinus on low-dose amiodarone 2. Ischemic cardiomyopathy EF 35-40% 3. Hypertension 4. CAD s/p CABG 5. Asymptomatic bradycardia 6. Anticoagulation on Eliquis 7. Fevers/Constitutional symptoms  PLAN: 1.   Mr. Schettler feels generally well denies any shortness of breath or chest pain. He is maintaining sinus rhythm. His heart rate is improved now after adjusting his medications and blood pressure has been well controlled. In fact he had one episode of hypotension and had his  medications held. This is in the setting of nocturnal fevers, chills and constitutional symptoms including night sweats. It is unclear what causing these episodes. He should have continued infectious disease workup and possibly looking for malignancy. His EF was 35-40% last year and I would like to recheck an echocardiogram to see if he's had improvement in LV function. I would not recommend changing his medications at this time. I would also not recommend holding his blood pressure medicines unless systolic blood pressure less than 100. He may end up chasing his blood pressures by holding medications for normal blood pressures and then allowing his blood pressures to creep up without adequate blood pressure medication. Plan to see him back in 6 months or sooner as necessary.  Chrystie Nose, MD, [facility] Attending Cardiologist [facility]  HILTY,Kenneth C 08/20/2013, 2:04 PM

## 2013-08-20 NOTE — Patient Instructions (Signed)

## 2013-08-25 ENCOUNTER — Other Ambulatory Visit: Payer: Self-pay | Admitting: Internal Medicine

## 2013-08-25 NOTE — Telephone Encounter (Signed)
Rx was sent to pharmacy electronically. 

## 2013-09-01 ENCOUNTER — Ambulatory Visit (HOSPITAL_COMMUNITY)
Admission: RE | Admit: 2013-09-01 | Discharge: 2013-09-01 | Disposition: A | Discharge status: Home / Self Care | Payer: Medicare Other | Source: Ambulatory Visit | Attending: Cardiovascular Disease | Admitting: Cardiovascular Disease

## 2013-09-01 DIAGNOSIS — I2589 Other forms of chronic ischemic heart disease: Secondary | ICD-10-CM | POA: Insufficient documentation

## 2013-09-01 DIAGNOSIS — R0609 Other forms of dyspnea: Secondary | ICD-10-CM | POA: Insufficient documentation

## 2013-09-01 DIAGNOSIS — R0989 Other specified symptoms and signs involving the circulatory and respiratory systems: Secondary | ICD-10-CM | POA: Insufficient documentation

## 2013-09-01 DIAGNOSIS — I059 Rheumatic mitral valve disease, unspecified: Secondary | ICD-10-CM

## 2013-09-01 NOTE — Progress Notes (Signed)
2D Echocardiogram Complete.  09/01/2013   Joss Friedel, RDCS  

## 2013-10-07 ENCOUNTER — Other Ambulatory Visit: Payer: Self-pay | Admitting: Internal Medicine

## 2013-10-07 ENCOUNTER — Other Ambulatory Visit: Payer: Self-pay | Admitting: Cardiovascular Disease

## 2013-10-07 NOTE — Telephone Encounter (Signed)
Rx was sent to pharmacy electronically. 

## 2013-12-02 ENCOUNTER — Telehealth: Payer: Self-pay | Admitting: Internal Medicine

## 2013-12-02 NOTE — Telephone Encounter (Signed)
Close encounter 

## 2013-12-11 ENCOUNTER — Other Ambulatory Visit: Payer: Self-pay

## 2013-12-11 MED ORDER — METOPROLOL TARTRATE 50 MG PO TABS: 25.0000 mg | ORAL_TABLET | Freq: Two times a day (BID) | ORAL | Status: DC

## 2013-12-11 NOTE — Telephone Encounter (Signed)
Rx sent to pharmacy   

## 2013-12-22 ENCOUNTER — Other Ambulatory Visit: Payer: Self-pay

## 2014-01-01 ENCOUNTER — Encounter (HOSPITAL_COMMUNITY): Payer: Self-pay | Admitting: Cardiology

## 2014-01-05 ENCOUNTER — Other Ambulatory Visit: Payer: Self-pay | Admitting: Internal Medicine

## 2014-01-05 MED ORDER — APIXABAN 5 MG PO TABS: ORAL_TABLET | ORAL | Status: DC

## 2014-01-05 NOTE — Telephone Encounter (Signed)
Pt need a refill on Eliquis 5 mg please. Please call to [facility].

## 2014-01-05 NOTE — Telephone Encounter (Signed)
Rx was sent to pharmacy electronically. 

## 2014-01-31 ENCOUNTER — Other Ambulatory Visit: Payer: Self-pay | Admitting: Internal Medicine

## 2014-02-02 NOTE — Telephone Encounter (Signed)
Rx refill sent to patient pharmacy   

## 2014-02-28 ENCOUNTER — Other Ambulatory Visit: Payer: Self-pay | Admitting: Internal Medicine

## 2014-03-02 ENCOUNTER — Ambulatory Visit: Payer: Medicare Other | Admitting: Internal Medicine

## 2014-03-02 NOTE — Telephone Encounter (Signed)
Rx(s) sent to pharmacy electronically.  

## 2014-03-05 ENCOUNTER — Ambulatory Visit (INDEPENDENT_AMBULATORY_CARE_PROVIDER_SITE_OTHER): Payer: Medicare Other | Admitting: Internal Medicine

## 2014-03-05 ENCOUNTER — Encounter: Payer: Self-pay | Admitting: Internal Medicine

## 2014-03-05 VITALS — BP 146/74 | HR 48 | Ht 74.5 in | Wt 274.8 lb

## 2014-03-05 DIAGNOSIS — R001 Bradycardia, unspecified: Secondary | ICD-10-CM

## 2014-03-05 DIAGNOSIS — I2583 Coronary atherosclerosis due to lipid rich plaque: Secondary | ICD-10-CM

## 2014-03-05 DIAGNOSIS — I2510 Atherosclerotic heart disease of native coronary artery without angina pectoris: Secondary | ICD-10-CM

## 2014-03-05 DIAGNOSIS — I480 Paroxysmal atrial fibrillation: Secondary | ICD-10-CM

## 2014-03-05 DIAGNOSIS — G4730 Sleep apnea, unspecified: Secondary | ICD-10-CM

## 2014-03-05 DIAGNOSIS — I10 Essential (primary) hypertension: Secondary | ICD-10-CM

## 2014-03-05 DIAGNOSIS — E785 Hyperlipidemia, unspecified: Secondary | ICD-10-CM

## 2014-03-05 DIAGNOSIS — Z79899 Other long term (current) drug therapy: Secondary | ICD-10-CM

## 2014-03-05 NOTE — Patient Instructions (Signed)
Your physician has recommended that you have a pulmonary function test. Pulmonary Function Tests are a group of tests that measure how well air moves in and out of your lungs. >> this is done at [facility]  Your physician recommends that you schedule a follow-up appointment in: 6 months with Dr. Rennis Golden.

## 2014-03-05 NOTE — Progress Notes (Signed)
OFFICE NOTE  Chief Complaint:  No concerns  Primary Care Physician: [name], MD  HPI:  Nicholas Lynn is a 69 year old male has a history of coronary artery disease with previous anterior infarction in 1988 treated with angioplasty. He had bypass grafting in 1996 and then redo bypass grafting in August of 2013. His grafts are detailed in the problem list. He had done well since bypass did have atrial fibrillation was on warfarin until January of this year as well as amiodarone. In January both amiodarone and warfarin were discontinued. He completed rehabilitation and had felt well without recurrence of arrhythmia. He was in his usual state of health but around 5 PM the day of admission after getting up from the bathroom noted the onset of rapid palpitations and irregular heartbeat. He came to the emergency room where he was found to be in rapid atrial fibrillation. He was given metoprolol as well as diltiazem with some slowing of his heart rate. He did not have any angina and denied symptoms of heart failure. He had no PND, orthopnea or significant edema. He has no significant claudication. He was admitted and started on Eliquis and amiodarone. TEE/DCCv was arranged and completed on 12/25/12. He converted without complications. 2D echo on 12/23/12 revealed and EF of 35-45% with wall motion abnormalities. Amiodarone 400mg  for seven days total then decreased to 400mg  daily. He recently saw [name], PA-C in followup and was noted to be bradycardic. His amiodarone was further decreased to 200 mg daily.  Despite his bradycardia with heart rate in the 40s, he reportedly is asymptomatic and is able to do most activities without much problem. He has noted however that his blood pressure continues to creep up somewhat and has been in the 150s or 160s systolic. At his last office visit I started him on Diovan HCTZ and he subsequently followed up with [name] and his blood pressure was much better  controlled.  He's done generally well since that time however today in followup he reports he's been having recent fevers up to 10 3 at night. This is associated with some night sweats and shaking chills. It seems to be cyclical. He was seen in urgent care workup for possible tick bites in [facility] spotted fever. This was negative. He is continue to followup with his primary care provider for additional workup as to possible causes of his constitutional symptoms.  I saw [name] back in the office today. He reports doing very well. He continues to exercise for 5 times a week. He says it difficult for him to get his heart rate much greater than 80 on the treadmill. This is likely due to his amiodarone and dose of metoprolol. Fortunately these medications are helping him maintain a sinus rhythm. He is asymptomatic with regards shortness of breath or any chest pain. Heart rate today is 48 and regular. He seems to be maintaining sinus rhythm. Blood pressure is well-controlled.  PMHx:  Past Medical History  Diagnosis Date  . Hyperlipidemia   . Hypertension   . Hernia     umbilical  . GERD (gastroesophageal reflux disease)   . Arthritis   . Coronary artery disease 1998; 1996; 2013    PTCA LCX; CABG X 4; RE-do CABG  . Hx of myocardial infarction 1983    medically treated   . OSA on CPAP   . COPD (chronic obstructive pulmonary disease)     mild on singulair  . A-fib  Past Surgical History  Procedure Laterality Date  . Angioplasty  1993    LCX  . Cardiac catheterization  2008;2013  . Cataract extraction    . Umbilical hernia repair  04/04/2011    Procedure: HERNIA REPAIR UMBILICAL ADULT;  Surgeon: Adolph Pollack, MD;  Location: [facility] ORS;  Service: General;  Laterality: N/A;  Umbilical Hernia Repair  . Tee without cardioversion  09/11/2011    Procedure: TRANSESOPHAGEAL ECHOCARDIOGRAM (TEE);  Surgeon: Delight Ovens, MD;  Location: [facility] OR;  Service: Open Heart Surgery;  Laterality:  N/A;  . Coronary artery bypass graft  1996    3 VESSELS  . Coronary artery bypass graft  09/11/2011    Procedure: REDO CORONARY ARTERY BYPASS GRAFTING (CABG);  Surgeon: Delight Ovens, MD;  Location: [facility] OR;  Service: Open Heart Surgery;  Laterality: N/A;  Redo Coronary Artery Bypass Graft times three utilizing the right internal mammary artery and the left  and right greater saphenous veins.  Rhae Hammock without cardioversion N/A 12/25/2012    Procedure: TRANSESOPHAGEAL ECHOCARDIOGRAM (TEE);  Surgeon: Chrystie Nose, MD;  Location: [facility] ENDOSCOPY;  Service: Cardiovascular;  Laterality: N/A;  . Cardioversion N/A 12/25/2012    Procedure: CARDIOVERSION;  Surgeon: Chrystie Nose, MD;  Location: [facility] ENDOSCOPY;  Service: Cardiovascular;  Laterality: N/A;  . Left heart catheterization with coronary angiogram N/A 09/01/2011    Procedure: LEFT HEART CATHETERIZATION WITH CORONARY ANGIOGRAM;  Surgeon: Marykay Lex, MD;  Location: [facility] CATH LAB;  Service: Cardiovascular;  Laterality: N/A;  . Graft(s) angiogram  09/01/2011    Procedure: GRAFT(S) Rosalin Hawking;  Surgeon: Marykay Lex, MD;  Location: [facility] CATH LAB;  Service: Cardiovascular;;    FAMHx:  Family History  Problem Relation Age of Onset  . Stroke Mother   . Cancer Father     lung  . Cancer Sister     lung  . Cancer Sister     liver    SOCHx:   reports that he quit smoking about 27 years ago. He has never used smokeless tobacco. He reports that he does not drink alcohol or use illicit drugs.  ALLERGIES:  Allergies  Allergen Reactions  . Sulfa Antibiotics Swelling    ROS: A comprehensive review of systems was negative.  HOME MEDS: Current Outpatient Prescriptions  Medication Sig Dispense Refill  . amiodarone (PACERONE) 200 MG tablet Take 1 tablet (200 mg total) by mouth daily. 30 tablet 11  . apixaban (ELIQUIS) 5 MG TABS tablet take 1 tablet by mouth twice a day 60 tablet 7  . aspirin EC 81 MG tablet Take 81 mg by mouth daily.    Marland Kitchen  atorvastatin (LIPITOR) 10 MG tablet take 1 tablet by mouth once daily AT 6:00 PM 30 tablet 10  . Bioflavonoid Products (ESTER C PO) Take 500 mg by mouth daily.      . cetirizine (ZYRTEC) 10 MG tablet Take 10 mg by mouth daily.      . Cholecalciferol (VITAMIN D-3 PO) Take 1,000 Units by mouth 2 (two) times daily.     Marland Kitchen FERREX 150 150 MG capsule TAKE 1 CAPSULE BY MOUTH ONCE DAILY 30 capsule 5  . fluticasone (VERAMYST) 27.5 MCG/SPRAY nasal spray Place 1 spray into the nose daily. For allergies    . folic acid (FOLVITE) 1 MG tablet take 1 tablet by mouth once daily 30 tablet 3  . metoprolol (LOPRESSOR) 50 MG tablet Take 0.5 tablets (25 mg total) by mouth 2 (two) times daily. 60 tablet  1  . Misc Natural Products (OSTEO BI-FLEX ADV TRIPLE ST PO) Take 1 tablet by mouth daily.     . montelukast (SINGULAIR) 10 MG tablet Take 10 mg by mouth at bedtime.     . Multiple Vitamin (MULTIVITAMIN WITH MINERALS) TABS Take 1 tablet by mouth daily.    . niacin (NIASPAN) 1000 MG CR tablet TAKE 1 TABLET BY MOUTH AT BEDTIME 30 tablet 3  . nitroGLYCERIN (NITROSTAT) 0.4 MG SL tablet Place 0.4 mg under the tongue every 5 (five) minutes as needed for chest pain.    . Omega-3 Fatty Acids (FISH OIL PO) Take 1,000 mg by mouth 2 (two) times daily.      Marland Kitchen omeprazole (PRILOSEC) 20 MG capsule Take 20 mg by mouth daily.     . valsartan-hydrochlorothiazide (DIOVAN-HCT) 160-12.5 MG per tablet take 1 tablet once daily 30 tablet 6  . ZETIA 10 MG tablet take 1 tablet by mouth once daily 30 tablet 10  . fluticasone (FLONASE) 50 MCG/ACT nasal spray as needed.  1   No current facility-administered medications for this visit.    LABS/IMAGING: No results found for this or any previous visit (from the past 48 hour(s)). No results found.  VITALS: BP 146/74 mmHg  Pulse 48  Ht 6' 2.5" (1.892 m)  Wt 274 lb 12.8 oz (124.648 kg)  BMI 34.82 kg/m2  EXAM: General appearance: alert and no distress Neck: no carotid bruit and no  JVD Lungs: clear to auscultation bilaterally Heart: regular bradycardia Abdomen: soft, non-tender; bowel sounds normal; no masses,  no organomegaly Extremities: extremities normal, atraumatic, no cyanosis or edema Pulses: 2+ and symmetric Skin: Skin color, texture, turgor normal. No rashes or lesions Neurologic: Grossly normal Psych: Mood, affect normal  EKG: Sinus bradycardia at 48, incomplete right bundle branch block  ASSESSMENT: 1. Paroxysmal atrial fibrillation status post TEE/cardioversion - maintaining sinus on low-dose amiodarone 2. Ischemic cardiomyopathy EF 35-40% 3. Hypertension 4. CAD s/p CABG 5. Asymptomatic bradycardia 6. Anticoagulation on Eliquis  PLAN: 1.   Mr. Lichtman continues to feel well despite his bradycardia. His EF has remained stable between 35-40%. He does not qualify for an AICD. Hypertension is well controlled. He denies any chest pain. He's had no bleeding problems on Eliquis. He seems to be maintaining sinus rhythm on amiodarone. He had recent laboratory work from his primary care provider including a total cholesterol 132, triglycerides 84, HDL 60, LDL 17..A1c was 5.9, liver enzymes were normal. TSH was normal. He is due for PFTs due to his ongoing amiodarone use. We'll go ahead and order those today. Otherwise plan to see him back in 6 months.  Chrystie Nose, MD, [facility] Attending Cardiologist [facility]  Danyella Mcginty C 03/05/2014, 4:57 PM

## 2014-03-10 ENCOUNTER — Encounter: Payer: Self-pay | Admitting: Internal Medicine

## 2014-03-12 ENCOUNTER — Ambulatory Visit (HOSPITAL_COMMUNITY)
Admission: RE | Admit: 2014-03-12 | Discharge: 2014-03-12 | Disposition: A | Discharge status: Home / Self Care | Payer: Medicare Other | Source: Ambulatory Visit | Attending: Internal Medicine | Admitting: Internal Medicine

## 2014-03-12 DIAGNOSIS — F17210 Nicotine dependence, cigarettes, uncomplicated: Secondary | ICD-10-CM | POA: Diagnosis not present

## 2014-03-12 DIAGNOSIS — Z79899 Other long term (current) drug therapy: Secondary | ICD-10-CM | POA: Insufficient documentation

## 2014-03-12 LAB — PULMONARY FUNCTION TEST
DL/VA % pred: 96 %
DL/VA: 4.52 ml/min/mmHg/L
DLCO unc % pred: 61 %
DLCO unc: 21.52 ml/min/mmHg
FEF 25-75 Pre: 2.19 L/sec
FEF2575-%PRED-PRE: 83 %
FEV1-%PRED-PRE: 81 %
FEV1-Pre: 2.83 L
FEV1FVC-%Pred-Pre: 102 %
FEV6-%PRED-PRE: 81 %
FEV6-Pre: 3.63 L
FEV6FVC-%Pred-Pre: 101 %
FVC-%Pred-Pre: 79 %
FVC-Pre: 3.77 L
PRE FEV1/FVC RATIO: 75 %
Pre FEV6/FVC Ratio: 96 %
RV % pred: 77 %
RV: 2.01 L
TLC % pred: 81 %
TLC: 6.05 L

## 2014-03-17 ENCOUNTER — Other Ambulatory Visit: Payer: Self-pay | Admitting: Internal Medicine

## 2014-03-17 NOTE — Telephone Encounter (Signed)
Rx has been sent to the pharmacy electronically. ° °

## 2014-03-19 ENCOUNTER — Other Ambulatory Visit: Payer: Self-pay | Admitting: *Deleted

## 2014-03-19 ENCOUNTER — Telehealth: Payer: Self-pay | Admitting: Internal Medicine

## 2014-03-19 DIAGNOSIS — Z79899 Other long term (current) drug therapy: Secondary | ICD-10-CM

## 2014-03-19 NOTE — Telephone Encounter (Signed)
Call received from respiratory at [facility]. In their work que it appears as if patient needs to have PFT now, instead of in 1 year. PFT for 1 year canceled to lessen confusion

## 2014-03-25 ENCOUNTER — Other Ambulatory Visit: Payer: Self-pay | Admitting: Internal Medicine

## 2014-03-25 NOTE — Telephone Encounter (Signed)
Rx has been sent to the pharmacy electronically. ° °

## 2014-05-26 ENCOUNTER — Other Ambulatory Visit: Payer: Self-pay | Admitting: Internal Medicine

## 2014-06-01 ENCOUNTER — Telehealth: Payer: Self-pay | Admitting: Internal Medicine

## 2014-06-04 NOTE — Telephone Encounter (Signed)
Close encounter 

## 2014-08-25 ENCOUNTER — Other Ambulatory Visit: Payer: Self-pay | Admitting: Internal Medicine

## 2014-08-31 ENCOUNTER — Other Ambulatory Visit: Payer: Self-pay | Admitting: Internal Medicine

## 2014-08-31 NOTE — Telephone Encounter (Signed)
Rx(s) sent to pharmacy electronically.  

## 2014-09-01 ENCOUNTER — Encounter: Payer: Self-pay | Admitting: Internal Medicine

## 2014-09-01 ENCOUNTER — Ambulatory Visit (INDEPENDENT_AMBULATORY_CARE_PROVIDER_SITE_OTHER): Payer: Medicare Other | Admitting: Internal Medicine

## 2014-09-01 VITALS — BP 162/60 | HR 44 | Ht 74.5 in | Wt 272.4 lb

## 2014-09-01 DIAGNOSIS — I2510 Atherosclerotic heart disease of native coronary artery without angina pectoris: Secondary | ICD-10-CM

## 2014-09-01 DIAGNOSIS — I10 Essential (primary) hypertension: Secondary | ICD-10-CM

## 2014-09-01 DIAGNOSIS — R001 Bradycardia, unspecified: Secondary | ICD-10-CM

## 2014-09-01 DIAGNOSIS — E785 Hyperlipidemia, unspecified: Secondary | ICD-10-CM

## 2014-09-01 DIAGNOSIS — I480 Paroxysmal atrial fibrillation: Secondary | ICD-10-CM

## 2014-09-01 DIAGNOSIS — I252 Old myocardial infarction: Secondary | ICD-10-CM | POA: Diagnosis not present

## 2014-09-01 DIAGNOSIS — Z7901 Long term (current) use of anticoagulants: Secondary | ICD-10-CM

## 2014-09-01 DIAGNOSIS — I255 Ischemic cardiomyopathy: Secondary | ICD-10-CM | POA: Diagnosis not present

## 2014-09-01 DIAGNOSIS — G4730 Sleep apnea, unspecified: Secondary | ICD-10-CM

## 2014-09-01 DIAGNOSIS — I2583 Coronary atherosclerosis due to lipid rich plaque: Secondary | ICD-10-CM

## 2014-09-01 DIAGNOSIS — E669 Obesity, unspecified: Secondary | ICD-10-CM

## 2014-09-01 NOTE — Progress Notes (Signed)
OFFICE NOTE  Chief Complaint:  No concerns  Primary Care Physician: [name], MD  HPI:  Nicholas Lynn is a 69 year old male has a history of coronary artery disease with previous anterior infarction in 1988 treated with angioplasty. He had bypass grafting in 1996 and then redo bypass grafting in August of 2013. His grafts are detailed in the problem list. He had done well since bypass did have atrial fibrillation was on warfarin until January of this year as well as amiodarone. In January both amiodarone and warfarin were discontinued. He completed rehabilitation and had felt well without recurrence of arrhythmia. He was in his usual state of health but around 5 PM the day of admission after getting up from the bathroom noted the onset of rapid palpitations and irregular heartbeat. He came to the emergency room where he was found to be in rapid atrial fibrillation. He was given metoprolol as well as diltiazem with some slowing of his heart rate. He did not have any angina and denied symptoms of heart failure. He had no PND, orthopnea or significant edema. He has no significant claudication. He was admitted and started on Eliquis and amiodarone. TEE/DCCv was arranged and completed on 12/25/12. He converted without complications. 2D echo on 12/23/12 revealed and EF of 35-45% with wall motion abnormalities. Amiodarone 400mg  for seven days total then decreased to 400mg  daily. He recently saw [name], PA-C in followup and was noted to be bradycardic. His amiodarone was further decreased to 200 mg daily.  Despite his bradycardia with heart rate in the 40s, he reportedly is asymptomatic and is able to do most activities without much problem. He has noted however that his blood pressure continues to creep up somewhat and has been in the 150s or 160s systolic. At his last office visit I started him on Diovan HCTZ and he subsequently followed up with [name] and his blood pressure was much better  controlled.  He's done generally well since that time however today in followup he reports he's been having recent fevers up to 10 3 at night. This is associated with some night sweats and shaking chills. It seems to be cyclical. He was seen in urgent care workup for possible tick bites in [facility] spotted fever. This was negative. He is continue to followup with his primary care provider for additional workup as to possible causes of his constitutional symptoms.  I saw Nicholas Lynn back in the office today. He reports doing very well. He continues to exercise for 5 times a week. He says it difficult for him to get his heart rate much greater than 80 on the treadmill. This is likely due to his amiodarone and dose of metoprolol. Fortunately these medications are helping him maintain a sinus rhythm. He is asymptomatic with regards shortness of breath or any chest pain. Heart rate today is 48 and regular. He seems to be maintaining sinus rhythm. Blood pressure is well-controlled.  Nicholas Lynn returns today for follow-up. He is without complaints. Blood pressure was initially elevated at 162/60 became down to 134/68. He denies any chest pain or worsening shortness of breath. He is on amiodarone 200 mg daily in addition to metoprolol 25 mg twice a day. Heart rate is in the mid 40s. He has had a low heart rate in the 40s for several years and seems to be stable with that. He's not had any worsening chest pain, shortness of breath or decreased exercise tolerance. In fact he feels  excellent. He can exercise and gets his heart rate generally up into the 80s or 90s. For this reason I did not see a need to decrease his medicine at this time.  PMHx:  Past Medical History  Diagnosis Date  . Hyperlipidemia   . Hypertension   . Hernia     umbilical  . GERD (gastroesophageal reflux disease)   . Arthritis   . Coronary artery disease 1998; 1996; 2013    PTCA LCX; CABG X 4; RE-do CABG  . Hx of myocardial infarction  1983    medically treated   . OSA on CPAP   . COPD (chronic obstructive pulmonary disease)     mild on singulair  . A-fib     Past Surgical History  Procedure Laterality Date  . Angioplasty  1993    LCX  . Cardiac catheterization  2008;2013  . Cataract extraction    . Umbilical hernia repair  04/04/2011    Procedure: HERNIA REPAIR UMBILICAL ADULT;  Surgeon: Adolph Pollack, MD;  Location: [facility] ORS;  Service: General;  Laterality: N/A;  Umbilical Hernia Repair  . Tee without cardioversion  09/11/2011    Procedure: TRANSESOPHAGEAL ECHOCARDIOGRAM (TEE);  Surgeon: Delight Ovens, MD;  Location: [facility] OR;  Service: Open Heart Surgery;  Laterality: N/A;  . Coronary artery bypass graft  1996    3 VESSELS  . Coronary artery bypass graft  09/11/2011    Procedure: REDO CORONARY ARTERY BYPASS GRAFTING (CABG);  Surgeon: Delight Ovens, MD;  Location: [facility] OR;  Service: Open Heart Surgery;  Laterality: N/A;  Redo Coronary Artery Bypass Graft times three utilizing the right internal mammary artery and the left  and right greater saphenous veins.  Rhae Hammock without cardioversion N/A 12/25/2012    Procedure: TRANSESOPHAGEAL ECHOCARDIOGRAM (TEE);  Surgeon: Chrystie Nose, MD;  Location: [facility] ENDOSCOPY;  Service: Cardiovascular;  Laterality: N/A;  . Cardioversion N/A 12/25/2012    Procedure: CARDIOVERSION;  Surgeon: Chrystie Nose, MD;  Location: [facility] ENDOSCOPY;  Service: Cardiovascular;  Laterality: N/A;  . Left heart catheterization with coronary angiogram N/A 09/01/2011    Procedure: LEFT HEART CATHETERIZATION WITH CORONARY ANGIOGRAM;  Surgeon: Marykay Lex, MD;  Location: [facility] CATH LAB;  Service: Cardiovascular;  Laterality: N/A;  . Graft(s) angiogram  09/01/2011    Procedure: GRAFT(S) Rosalin Hawking;  Surgeon: Marykay Lex, MD;  Location: [facility] CATH LAB;  Service: Cardiovascular;;    FAMHx:  Family History  Problem Relation Age of Onset  . Stroke Mother   . Cancer Father     lung  . Cancer Sister     lung    . Cancer Sister     liver    SOCHx:   reports that he quit smoking about 28 years ago. He has never used smokeless tobacco. He reports that he does not drink alcohol or use illicit drugs.  ALLERGIES:  Allergies  Allergen Reactions  . Sulfa Antibiotics Swelling    ROS: A comprehensive review of systems was negative.  HOME MEDS: Current Outpatient Prescriptions  Medication Sig Dispense Refill  . amiodarone (PACERONE) 200 MG tablet take 1 tablet by mouth once daily 30 tablet 11  . aspirin EC 81 MG tablet Take 81 mg by mouth daily.    Marland Kitchen atorvastatin (LIPITOR) 10 MG tablet TAKE 1 TABLET BY MOUTH ONCE DAILY AT 6:00 30 tablet 10  . Bioflavonoid Products (ESTER C PO) Take 500 mg by mouth daily.      . cetirizine (ZYRTEC) 10  MG tablet Take 10 mg by mouth daily.      . Cholecalciferol (VITAMIN D-3 PO) Take 1,000 Units by mouth 2 (two) times daily.     Marland Kitchen ELIQUIS 5 MG TABS tablet take 1 tablet by mouth twice a day 60 tablet 5  . FERREX 150 150 MG capsule TAKE 1 CAPSULE BY MOUTH ONCE DAILY 30 capsule 5  . fluticasone (FLONASE) 50 MCG/ACT nasal spray as needed.  1  . fluticasone (VERAMYST) 27.5 MCG/SPRAY nasal spray Place 1 spray into the nose daily. For allergies    . folic acid (FOLVITE) 1 MG tablet TAKE 1 TABLET BY MOUTH ONCE DAILY 30 tablet 6  . metoprolol tartrate (LOPRESSOR) 25 MG tablet Take 1 tablet (25 mg total) by mouth 2 (two) times daily. 60 tablet 6  . Misc Natural Products (OSTEO BI-FLEX ADV TRIPLE ST PO) Take 1 tablet by mouth daily.     . montelukast (SINGULAIR) 10 MG tablet Take 10 mg by mouth at bedtime.     . Multiple Vitamin (MULTIVITAMIN WITH MINERALS) TABS Take 1 tablet by mouth daily.    . niacin (NIASPAN) 1000 MG CR tablet take 1 tablet by mouth at bedtime 30 tablet 6  . nitroGLYCERIN (NITROSTAT) 0.4 MG SL tablet Place 0.4 mg under the tongue every 5 (five) minutes as needed for chest pain.    . Omega-3 Fatty Acids (FISH OIL PO) Take 1,000 mg by mouth 2 (two) times  daily.      Marland Kitchen omeprazole (PRILOSEC) 20 MG capsule Take 20 mg by mouth daily.     . valsartan-hydrochlorothiazide (DIOVAN-HCT) 160-12.5 MG per tablet TAKE 1 TABLET BY MOUTH ONCE DAILY 30 tablet 6  . ZETIA 10 MG tablet take 1 tablet by mouth once daily 30 tablet 10   No current facility-administered medications for this visit.    LABS/IMAGING: No results found for this or any previous visit (from the past 48 hour(s)). No results found.  VITALS: BP 162/60 mmHg  Pulse 44  Ht 6' 2.5" (1.892 m)  Wt 272 lb 6.4 oz (123.56 kg)  BMI 34.52 kg/m2  EXAM: General appearance: alert and no distress Neck: no carotid bruit and no JVD Lungs: clear to auscultation bilaterally Heart: regular bradycardia Abdomen: soft, non-tender; bowel sounds normal; no masses,  no organomegaly Extremities: extremities normal, atraumatic, no cyanosis or edema Pulses: 2+ and symmetric Skin: Skin color, texture, turgor normal. No rashes or lesions Neurologic: Grossly normal Psych: Mood, affect normal  EKG: Sinus bradycardia at 44, incomplete right bundle branch block, first-degree AV block with PR interval 202 ms  ASSESSMENT: 1. Paroxysmal atrial fibrillation status post TEE/cardioversion - maintaining sinus bradycardia on low-dose amiodarone 2. Ischemic cardiomyopathy EF 35-40% 3. Hypertension 4. CAD s/p CABG 5. Asymptomatic bradycardia 6. Anticoagulation on Eliquis - CHADSVASC 6  PLAN: 1.   Nicholas Lynn says he feels "excellent". His heart rate remains low in the mid 40s however it does go up with exercise and he is able to exercise and exert himself without any symptoms. Since he feels well and is maintaining normal blood pressure, did not see a clear indication to decrease his beta blocker at this time. Will plan to go ahead and recheck an echocardiogram to see if there's been any improvement in LV function. Plan to see him back in 6 months or sooner as necessary.  Chrystie Nose, MD, [facility] Attending  Cardiologist [facility]  Chrystie Nose 09/01/2014, 3:34 PM

## 2014-09-01 NOTE — Patient Instructions (Signed)
Your physician has requested that you have an echocardiogram @ [address]. Echocardiography is a painless test that uses sound waves to create images of your heart. It provides your doctor with information about the size and shape of your heart and how well your heart's chambers and valves are working. This procedure takes approximately one hour. There are no restrictions for this procedure.  Your physician recommends that you schedule a follow-up appointment in: 6 months with Dr. Rennis Golden.

## 2014-09-02 ENCOUNTER — Other Ambulatory Visit: Payer: Self-pay | Admitting: Internal Medicine

## 2014-09-02 NOTE — Telephone Encounter (Signed)
Rx(s) sent to pharmacy electronically.  

## 2014-09-09 ENCOUNTER — Other Ambulatory Visit: Payer: Self-pay

## 2014-09-09 ENCOUNTER — Ambulatory Visit (HOSPITAL_COMMUNITY): Payer: Medicare Other | Attending: Cardiovascular Disease

## 2014-09-09 DIAGNOSIS — I517 Cardiomegaly: Secondary | ICD-10-CM | POA: Insufficient documentation

## 2014-09-09 DIAGNOSIS — I480 Paroxysmal atrial fibrillation: Secondary | ICD-10-CM

## 2014-09-09 DIAGNOSIS — I4891 Unspecified atrial fibrillation: Secondary | ICD-10-CM | POA: Diagnosis present

## 2014-09-09 DIAGNOSIS — I255 Ischemic cardiomyopathy: Secondary | ICD-10-CM

## 2014-09-09 DIAGNOSIS — I252 Old myocardial infarction: Secondary | ICD-10-CM | POA: Diagnosis not present

## 2014-09-09 DIAGNOSIS — I10 Essential (primary) hypertension: Secondary | ICD-10-CM | POA: Insufficient documentation

## 2014-09-09 MED ORDER — PERFLUTREN LIPID MICROSPHERE
2.0000 mL | Freq: Once | INTRAVENOUS | Status: AC
  Administered 2014-09-09: 2 mL via INTRAVENOUS

## 2014-09-27 ENCOUNTER — Other Ambulatory Visit: Payer: Self-pay | Admitting: Internal Medicine

## 2014-10-20 ENCOUNTER — Other Ambulatory Visit: Payer: Self-pay | Admitting: Internal Medicine

## 2014-10-20 NOTE — Telephone Encounter (Signed)
Rx request sent to pharmacy.  

## 2014-12-16 ENCOUNTER — Other Ambulatory Visit: Payer: Self-pay | Admitting: Internal Medicine

## 2014-12-16 NOTE — Telephone Encounter (Signed)
Rx request sent to pharmacy.  

## 2015-02-16 ENCOUNTER — Other Ambulatory Visit: Payer: Self-pay | Admitting: Internal Medicine

## 2015-03-08 ENCOUNTER — Encounter: Payer: Self-pay | Admitting: Internal Medicine

## 2015-03-08 ENCOUNTER — Ambulatory Visit (INDEPENDENT_AMBULATORY_CARE_PROVIDER_SITE_OTHER): Payer: Medicare Other | Admitting: Internal Medicine

## 2015-03-08 VITALS — BP 134/74 | HR 44 | Ht 74.5 in | Wt 275.1 lb

## 2015-03-08 DIAGNOSIS — G4730 Sleep apnea, unspecified: Secondary | ICD-10-CM

## 2015-03-08 DIAGNOSIS — I4819 Other persistent atrial fibrillation: Secondary | ICD-10-CM

## 2015-03-08 DIAGNOSIS — E785 Hyperlipidemia, unspecified: Secondary | ICD-10-CM

## 2015-03-08 DIAGNOSIS — Z79899 Other long term (current) drug therapy: Secondary | ICD-10-CM

## 2015-03-08 DIAGNOSIS — I481 Persistent atrial fibrillation: Secondary | ICD-10-CM

## 2015-03-08 DIAGNOSIS — I480 Paroxysmal atrial fibrillation: Secondary | ICD-10-CM

## 2015-03-08 DIAGNOSIS — I2510 Atherosclerotic heart disease of native coronary artery without angina pectoris: Secondary | ICD-10-CM

## 2015-03-08 DIAGNOSIS — R001 Bradycardia, unspecified: Secondary | ICD-10-CM

## 2015-03-08 DIAGNOSIS — I5022 Chronic systolic (congestive) heart failure: Secondary | ICD-10-CM

## 2015-03-08 DIAGNOSIS — Z7901 Long term (current) use of anticoagulants: Secondary | ICD-10-CM

## 2015-03-08 LAB — BASIC METABOLIC PANEL
BUN: 11 mg/dL (ref 7–25)
CHLORIDE: 102 mmol/L (ref 98–110)
CO2: 29 mmol/L (ref 20–31)
CREATININE: 0.95 mg/dL (ref 0.70–1.18)
Calcium: 9.7 mg/dL (ref 8.6–10.3)
Glucose, Bld: 82 mg/dL (ref 65–99)
Potassium: 4.4 mmol/L (ref 3.5–5.3)
Sodium: 141 mmol/L (ref 135–146)

## 2015-03-08 LAB — TSH: TSH: 1.4 mIU/L (ref 0.40–4.50)

## 2015-03-08 MED ORDER — METOPROLOL TARTRATE 25 MG PO TABS: 12.5000 mg | ORAL_TABLET | Freq: Two times a day (BID) | ORAL | Status: DC

## 2015-03-08 NOTE — Progress Notes (Signed)
OFFICE NOTE  Chief Complaint:  No concerns  Primary Care Physician: [name], MD  HPI:  Nicholas Lynn is a 70 year old male has a history of coronary artery disease with previous anterior infarction in 1988 treated with angioplasty. He had bypass grafting in 1996 and then redo bypass grafting in August of 2013. His grafts are detailed in the problem list. He had done well since bypass did have atrial fibrillation was on warfarin until January of this year as well as amiodarone. In January both amiodarone and warfarin were discontinued. He completed rehabilitation and had felt well without recurrence of arrhythmia. He was in his usual state of health but around 5 PM the day of admission after getting up from the bathroom noted the onset of rapid palpitations and irregular heartbeat. He came to the emergency room where he was found to be in rapid atrial fibrillation. He was given metoprolol as well as diltiazem with some slowing of his heart rate. He did not have any angina and denied symptoms of heart failure. He had no PND, orthopnea or significant edema. He has no significant claudication. He was admitted and started on Eliquis and amiodarone. TEE/DCCv was arranged and completed on 12/25/12. He converted without complications. 2D echo on 12/23/12 revealed and EF of 35-45% with wall motion abnormalities. Amiodarone 400mg  for seven days total then decreased to 400mg  daily. He recently saw Nicholas Finlay, PA-C in followup and was noted to be bradycardic. His amiodarone was further decreased to 200 mg daily.  Despite his bradycardia with heart rate in the 40s, he reportedly is asymptomatic and is able to do most activities without much problem. He has noted however that his blood pressure continues to creep up somewhat and has been in the 150s or 160s systolic. At his last office visit I started him on Diovan HCTZ and he subsequently followed up with Nicholas Lynn and his blood pressure was much better  controlled.  He's done generally well since that time however today in followup he reports he's been having recent fevers up to 10 3 at night. This is associated with some night sweats and shaking chills. It seems to be cyclical. He was seen in urgent care workup for possible tick bites in [facility] spotted fever. This was negative. He is continue to followup with his primary care provider for additional workup as to possible causes of his constitutional symptoms.  I saw Nicholas Lynn back in the office today. He reports doing very well. He continues to exercise for 5 times a week. He says it difficult for him to get his heart rate much greater than 80 on the treadmill. This is likely due to his amiodarone and dose of metoprolol. Fortunately these medications are helping him maintain a sinus rhythm. He is asymptomatic with regards shortness of breath or any chest pain. Heart rate today is 48 and regular. He seems to be maintaining sinus rhythm. Blood pressure is well-controlled.  Nicholas Lynn returns today for follow-up. He is without complaints. Blood pressure was initially elevated at 162/60 became down to 134/68. He denies any chest pain or worsening shortness of breath. He is on amiodarone 200 mg daily in addition to metoprolol 25 mg twice a day. Heart rate is in the mid 40s. He has had a low heart rate in the 40s for several years and seems to be stable with that. He's not had any worsening chest pain, shortness of breath or decreased exercise tolerance. In fact he feels  excellent. He can exercise and gets his heart rate generally up into the 80s or 90s. For this reason I did not see a need to decrease his medicine at this time.  Nicholas Lynn returns today for follow-up. Again he is bradycardic in the 40s. He says he has no symptoms with this although with exercise he does say that his heart rate generally only gets up to the 70s or 80s which is even lower than it had been previously. Blood pressure is  within normal limits. EKG shows sinus bradycardia which is marked at 44 with aberrant PACs. He does report some fatigue however is not clear whether this is related to heart rate or not. He has been on amiodarone without recurrent A. fib. He is due for repeat laboratory work. Although he had recent labs from his primary care provider, he did not have a TSH which is required on amiodarone. He did have liver function tests which were normal. He will also need repeat pulmonary function tests.  PMHx:  Past Medical History  Diagnosis Date  . Hyperlipidemia   . Hypertension   . Hernia     umbilical  . GERD (gastroesophageal reflux disease)   . Arthritis   . Coronary artery disease 1998; 1996; 2013    PTCA LCX; CABG X 4; RE-do CABG  . Hx of myocardial infarction 1983    medically treated   . OSA on CPAP   . COPD (chronic obstructive pulmonary disease) (HCC)     mild on singulair  . A-fib [facility])     Past Surgical History  Procedure Laterality Date  . Angioplasty  1993    LCX  . Cardiac catheterization  2008;2013  . Cataract extraction    . Umbilical hernia repair  04/04/2011    Procedure: HERNIA REPAIR UMBILICAL ADULT;  Surgeon: Adolph Pollack, MD;  Location: [facility] ORS;  Service: General;  Laterality: N/A;  Umbilical Hernia Repair  . Tee without cardioversion  09/11/2011    Procedure: TRANSESOPHAGEAL ECHOCARDIOGRAM (TEE);  Surgeon: Delight Ovens, MD;  Location: [facility] OR;  Service: Open Heart Surgery;  Laterality: N/A;  . Coronary artery bypass graft  1996    3 VESSELS  . Coronary artery bypass graft  09/11/2011    Procedure: REDO CORONARY ARTERY BYPASS GRAFTING (CABG);  Surgeon: Delight Ovens, MD;  Location: [facility] OR;  Service: Open Heart Surgery;  Laterality: N/A;  Redo Coronary Artery Bypass Graft times three utilizing the right internal mammary artery and the left  and right greater saphenous veins.  Rhae Hammock without cardioversion N/A 12/25/2012    Procedure: TRANSESOPHAGEAL ECHOCARDIOGRAM  (TEE);  Surgeon: Chrystie Nose, MD;  Location: [facility] ENDOSCOPY;  Service: Cardiovascular;  Laterality: N/A;  . Cardioversion N/A 12/25/2012    Procedure: CARDIOVERSION;  Surgeon: Chrystie Nose, MD;  Location: [facility] ENDOSCOPY;  Service: Cardiovascular;  Laterality: N/A;  . Left heart catheterization with coronary angiogram N/A 09/01/2011    Procedure: LEFT HEART CATHETERIZATION WITH CORONARY ANGIOGRAM;  Surgeon: Marykay Lex, MD;  Location: [facility] CATH LAB;  Service: Cardiovascular;  Laterality: N/A;  . Graft(s) angiogram  09/01/2011    Procedure: GRAFT(S) Rosalin Hawking;  Surgeon: Marykay Lex, MD;  Location: [facility] CATH LAB;  Service: Cardiovascular;;    FAMHx:  Family History  Problem Relation Age of Onset  . Stroke Mother   . Cancer Father     lung  . Cancer Sister     lung  . Cancer Sister  liver    SOCHx:   reports that he quit smoking about 28 years ago. He has never used smokeless tobacco. He reports that he does not drink alcohol or use illicit drugs.  ALLERGIES:  Allergies  Allergen Reactions  . Sulfa Antibiotics Swelling    ROS: A comprehensive review of systems was negative.  HOME MEDS: Current Outpatient Prescriptions  Medication Sig Dispense Refill  . amiodarone (PACERONE) 200 MG tablet take 1 tablet by mouth once daily 30 tablet 11  . aspirin EC 81 MG tablet Take 81 mg by mouth daily.    Marland Kitchen atorvastatin (LIPITOR) 10 MG tablet TAKE 1 TABLET BY MOUTH ONCE DAILY AT 6:00 30 tablet 10  . Bioflavonoid Products (ESTER C PO) Take 500 mg by mouth daily.      . cetirizine (ZYRTEC) 10 MG tablet Take 10 mg by mouth daily.      . Cholecalciferol (VITAMIN D-3 PO) Take 1,000 Units by mouth 2 (two) times daily.     Marland Kitchen ELIQUIS 5 MG TABS tablet take 1 tablet by mouth twice a day 60 tablet 5  . FERREX 150 150 MG capsule TAKE 1 CAPSULE BY MOUTH ONCE DAILY' 30 capsule 2  . fluticasone (FLONASE) 50 MCG/ACT nasal spray as needed.  1  . fluticasone (VERAMYST) 27.5 MCG/SPRAY nasal spray Place  1 spray into the nose daily. For allergies    . folic acid (FOLVITE) 1 MG tablet take 1 tablet by mouth once daily 30 tablet 2  . metoprolol tartrate (LOPRESSOR) 25 MG tablet Take 0.5 tablets (12.5 mg total) by mouth 2 (two) times daily. 60 tablet 5  . Misc Natural Products (OSTEO BI-FLEX ADV TRIPLE ST PO) Take 1 tablet by mouth daily.     . montelukast (SINGULAIR) 10 MG tablet Take 10 mg by mouth at bedtime.     . Multiple Vitamin (MULTIVITAMIN WITH MINERALS) TABS Take 1 tablet by mouth daily.    . niacin (NIASPAN) 1000 MG CR tablet take 1 tablet by mouth at bedtime 30 tablet 2  . NITROSTAT 0.4 MG SL tablet PLACE 1 TABLET UNDER THE TONGUE EVERY 5 MINUTES  AS NEEDED FOR CHEST PAIN( MAX 3 DOSES, 5 MINUTES APART) 25 tablet 2  . Omega-3 Fatty Acids (FISH OIL PO) Take 1,000 mg by mouth 2 (two) times daily.      Marland Kitchen omeprazole (PRILOSEC) 20 MG capsule Take 20 mg by mouth daily.     . valsartan-hydrochlorothiazide (DIOVAN-HCT) 160-12.5 MG tablet take 1 tablet once daily 30 tablet 5  . ZETIA 10 MG tablet take 1 tablet by mouth once daily 30 tablet 10   No current facility-administered medications for this visit.    LABS/IMAGING: No results found for this or any previous visit (from the past 48 hour(s)). No results found.  VITALS: BP 134/74 mmHg  Pulse 44  Ht 6' 2.5" (1.892 m)  Wt 275 lb 1.6 oz (124.785 kg)  BMI 34.86 kg/m2  EXAM: General appearance: alert and no distress Neck: no carotid bruit and no JVD Lungs: clear to auscultation bilaterally Heart: regular bradycardia Abdomen: soft, non-tender; bowel sounds normal; no masses,  no organomegaly Extremities: extremities normal, atraumatic, no cyanosis or edema Pulses: 2+ and symmetric Skin: Skin color, texture, turgor normal. No rashes or lesions Neurologic: Grossly normal Psych: Mood, affect normal  EKG: Sinus bradycardia at 44, incomplete right bundle branch block, first-degree AV block with PR interval 202  ms  ASSESSMENT: 1. Paroxysmal atrial fibrillation status post TEE/cardioversion -  2.  Possibly symptomatic sinus bradycardia on low-dose amiodarone and metoprolol 3. Ischemic cardiomyopathy EF 35-40% 4. Hypertension 5. CAD s/p CABG 6. Asymptomatic bradycardia 7. Anticoagulation on Eliquis - CHADSVASC 6  PLAN: 1.   Mr. Boutelle says he feels well, but does get some fatigue. He still has a significant amount of chronotropic incompetence with exercise on the treadmill. Some of this could be related to ongoing bradycardia. I've hesitated about changing his medicine before because of the beneficial effects of the beta blocker, however he may be over beta blocked at this point. I recommend decreasing his metoprolol to 12.5 mg twice a day. I'll look to see if he has any symptomatic improvement. We'll order a TSH today as well as a metabolic profile and pulmonary function tests. He had recent laboratory work from his primary care provider showing a good cholesterol profile with total cholesterol 127 and LDL of 55. His hemoglobin A1c was only 5.7. He is doing well on Eliquis without bleeding problems.  Plan to see him back in 6 months or sooner as necessary.  Chrystie Nose, MD, [facility] Attending Cardiologist [facility]  Chrystie Nose 03/08/2015, 6:45 PM

## 2015-03-08 NOTE — Patient Instructions (Signed)
Medication Instructions:   DECREASE metoprolol tartrate to 12.5mg  twice daily  Labwork:  TODAY - BNP, BMET, TSH  Testing/Procedures:  none  Follow-Up:  Your physician wants you to follow-up in: 6 months with Dr. Rennis Golden. You will receive a reminder letter in the mail two months in advance. If you don't receive a letter, please call our office to schedule the follow-up appointment.   Any Other Special Instructions Will Be Listed Below (If Applicable).

## 2015-03-09 LAB — BRAIN NATRIURETIC PEPTIDE: BRAIN NATRIURETIC PEPTIDE: 124.8 pg/mL — AB (ref <100)

## 2015-03-15 ENCOUNTER — Ambulatory Visit (HOSPITAL_COMMUNITY)
Admission: RE | Admit: 2015-03-15 | Discharge: 2015-03-15 | Disposition: A | Discharge status: Home / Self Care | Payer: Medicare Other | Source: Ambulatory Visit | Attending: Internal Medicine | Admitting: Internal Medicine

## 2015-03-15 DIAGNOSIS — Z79899 Other long term (current) drug therapy: Secondary | ICD-10-CM | POA: Insufficient documentation

## 2015-03-15 DIAGNOSIS — R942 Abnormal results of pulmonary function studies: Secondary | ICD-10-CM | POA: Insufficient documentation

## 2015-03-15 LAB — PULMONARY FUNCTION TEST
DL/VA % PRED: 90 %
DL/VA: 4.33 ml/min/mmHg/L
DLCO unc % pred: 64 %
DLCO unc: 23.46 ml/min/mmHg
FEF 25-75 POST: 2.02 L/s
FEF 25-75 Pre: 2.55 L/sec
FEF2575-%CHANGE-POST: -20 %
FEF2575-%PRED-POST: 76 %
FEF2575-%Pred-Pre: 96 %
FEV1-%CHANGE-POST: -9 %
FEV1-%PRED-PRE: 80 %
FEV1-%Pred-Post: 73 %
FEV1-POST: 2.62 L
FEV1-Pre: 2.88 L
FEV1FVC-%CHANGE-POST: -3 %
FEV1FVC-%Pred-Pre: 105 %
FEV6-%Change-Post: -4 %
FEV6-%PRED-POST: 76 %
FEV6-%Pred-Pre: 80 %
FEV6-PRE: 3.7 L
FEV6-Post: 3.51 L
FEV6FVC-%CHANGE-POST: 1 %
FEV6FVC-%PRED-PRE: 103 %
FEV6FVC-%Pred-Post: 104 %
FVC-%CHANGE-POST: -6 %
FVC-%PRED-POST: 72 %
FVC-%PRED-PRE: 77 %
FVC-POST: 3.52 L
FVC-PRE: 3.76 L
POST FEV1/FVC RATIO: 74 %
PRE FEV6/FVC RATIO: 98 %
Post FEV6/FVC ratio: 100 %
Pre FEV1/FVC ratio: 77 %
RV % PRED: 65 %
RV: 1.74 L
TLC % pred: 72 %
TLC: 5.54 L

## 2015-03-15 MED ORDER — ALBUTEROL SULFATE (2.5 MG/3ML) 0.083% IN NEBU
2.5000 mg | INHALATION_SOLUTION | Freq: Once | RESPIRATORY_TRACT | Status: AC
  Administered 2015-03-15: 2.5 mg via RESPIRATORY_TRACT

## 2015-03-16 ENCOUNTER — Other Ambulatory Visit: Payer: Self-pay | Admitting: Internal Medicine

## 2015-03-16 NOTE — Telephone Encounter (Signed)
Rx request sent to pharmacy.  

## 2015-03-19 ENCOUNTER — Other Ambulatory Visit: Payer: Self-pay | Admitting: Internal Medicine

## 2015-03-19 NOTE — Telephone Encounter (Signed)
REFILL 

## 2015-04-18 ENCOUNTER — Other Ambulatory Visit: Payer: Self-pay | Admitting: Internal Medicine

## 2015-06-08 ENCOUNTER — Telehealth: Payer: Self-pay | Admitting: Internal Medicine

## 2015-06-08 NOTE — Telephone Encounter (Signed)
Patient brought forms for [employer] in on 05/25/15.  Given to [employer].  Was sent to [employer] to process on 05/26/15.  [employer] returned to patient because no Dates of Service indicated on AUTH.  Patient came in office 06/07/15 and advised Dates of Service and gave new check.  Sent to [employer] @ [employer] to process. lp

## 2015-07-12 ENCOUNTER — Telehealth: Payer: Self-pay | Admitting: Internal Medicine

## 2015-07-12 NOTE — Telephone Encounter (Signed)
07/12/2015 Received FMLA form back from [employer] for Dr. Rennis Golden to fill out.  Placed form on [place] desk.  cbr

## 2015-07-13 ENCOUNTER — Telehealth: Payer: Self-pay | Admitting: Internal Medicine

## 2015-07-13 NOTE — Telephone Encounter (Signed)
LM for patient to return call regarding FMLA/sleep apnea disability benefits questionnaire.   Questions: 1. Is he still working? 2. How long has he had sleep apnea?

## 2015-07-14 NOTE — Telephone Encounter (Signed)
Nicholas Lynn with medical records was able to locate office note where sleep study was ordered and the follow up office note. She also spoke with patient's wife and patient is not working.   Form was completed, given to Nicholas Lynn who then that Dr. Duke Salvia sign the form since Dr. Rennis Golden is out of the office.

## 2015-07-18 ENCOUNTER — Other Ambulatory Visit: Payer: Self-pay | Admitting: Internal Medicine

## 2015-07-19 NOTE — Telephone Encounter (Signed)
Rx request sent to pharmacy.  

## 2015-08-08 ENCOUNTER — Other Ambulatory Visit: Payer: Self-pay | Admitting: Internal Medicine

## 2015-08-09 NOTE — Telephone Encounter (Signed)
Rx(s) sent to pharmacy electronically.  

## 2015-08-17 ENCOUNTER — Other Ambulatory Visit: Payer: Self-pay | Admitting: Internal Medicine

## 2015-09-10 ENCOUNTER — Ambulatory Visit (INDEPENDENT_AMBULATORY_CARE_PROVIDER_SITE_OTHER): Payer: Medicare Other | Admitting: Internal Medicine

## 2015-09-10 ENCOUNTER — Encounter: Payer: Self-pay | Admitting: Internal Medicine

## 2015-09-10 VITALS — BP 119/60 | HR 44 | Ht 74.5 in | Wt 273.0 lb

## 2015-09-10 DIAGNOSIS — I480 Paroxysmal atrial fibrillation: Secondary | ICD-10-CM

## 2015-09-10 DIAGNOSIS — I255 Ischemic cardiomyopathy: Secondary | ICD-10-CM

## 2015-09-10 DIAGNOSIS — G4730 Sleep apnea, unspecified: Secondary | ICD-10-CM

## 2015-09-10 DIAGNOSIS — I2510 Atherosclerotic heart disease of native coronary artery without angina pectoris: Secondary | ICD-10-CM | POA: Diagnosis not present

## 2015-09-10 NOTE — Progress Notes (Signed)
OFFICE NOTE  Chief Complaint:  No concerns  Primary Care Physician: [name], MD  HPI:  Nicholas Lynn is a 70 year old male has a history of coronary artery disease with previous anterior infarction in 1988 treated with angioplasty. He had bypass grafting in 1996 and then redo bypass grafting in August of 2013. His grafts are detailed in the problem list. He had done well since bypass did have atrial fibrillation was on warfarin until January of this year as well as amiodarone. In January both amiodarone and warfarin were discontinued. He completed rehabilitation and had felt well without recurrence of arrhythmia. He was in his usual state of health but around 5 PM the day of admission after getting up from the bathroom noted the onset of rapid palpitations and irregular heartbeat. He came to the emergency room where he was found to be in rapid atrial fibrillation. He was given metoprolol as well as diltiazem with some slowing of his heart rate. He did not have any angina and denied symptoms of heart failure. He had no PND, orthopnea or significant edema. He has no significant claudication. He was admitted and started on Eliquis and amiodarone. TEE/DCCv was arranged and completed on 12/25/12. He converted without complications. 2D echo on 12/23/12 revealed and EF of 35-45% with wall motion abnormalities. Amiodarone 400mg  for seven days total then decreased to 400mg  daily. He recently saw [name], PA-C in followup and was noted to be bradycardic. His amiodarone was further decreased to 200 mg daily.  Despite his bradycardia with heart rate in the 40s, he reportedly is asymptomatic and is able to do most activities without much problem. He has noted however that his blood pressure continues to creep up somewhat and has been in the 150s or 160s systolic. At his last office visit I started him on Diovan HCTZ and he subsequently followed up with Nicholas Lynn and his blood pressure was much better  controlled.  He's done generally well since that time however today in followup he reports he's been having recent fevers up to 10 3 at night. This is associated with some night sweats and shaking chills. It seems to be cyclical. He was seen in urgent care workup for possible tick bites in [facility] spotted fever. This was negative. He is continue to followup with his primary care provider for additional workup as to possible causes of his constitutional symptoms.  I saw Nicholas Lynn back in the office today. He reports doing very well. He continues to exercise for 5 times a week. He says it difficult for him to get his heart rate much greater than 80 on the treadmill. This is likely due to his amiodarone and dose of metoprolol. Fortunately these medications are helping him maintain a sinus rhythm. He is asymptomatic with regards shortness of breath or any chest pain. Heart rate today is 48 and regular. He seems to be maintaining sinus rhythm. Blood pressure is well-controlled.  Nicholas Lynn returns today for follow-up. He is without complaints. Blood pressure was initially elevated at 162/60 became down to 134/68. He denies any chest pain or worsening shortness of breath. He is on amiodarone 200 mg daily in addition to metoprolol 25 mg twice a day. Heart rate is in the mid 40s. He has had a low heart rate in the 40s for several years and seems to be stable with that. He's not had any worsening chest pain, shortness of breath or decreased exercise tolerance. In fact he feels  excellent. He can exercise and gets his heart rate generally up into the 80s or 90s. For this reason I did not see a need to decrease his medicine at this time.  Nicholas Lynn returns today for follow-up. Again he is bradycardic in the 40s. He says he has no symptoms with this although with exercise he does say that his heart rate generally only gets up to the 70s or 80s which is even lower than it had been previously. Blood pressure is  within normal limits. EKG shows sinus bradycardia which is marked at 44 with aberrant PACs. He does report some fatigue however is not clear whether this is related to heart rate or not. He has been on amiodarone without recurrent A. fib. He is due for repeat laboratory work. Although he had recent labs from his primary care provider, he did not have a TSH which is required on amiodarone. He did have liver function tests which were normal. He will also need repeat pulmonary function tests.  09/11/2015  Nicholas Lynn returns today for follow-up. He denies any chest pain or worsening shortness of breath. He does not seem to be symptomatic with his bradycardia. We decreased his beta blocker further at his last visit but it does not seem to change his heart rate all. He continues to be able to exercise and is asymptomatic.  PMHx:  Past Medical History:  Diagnosis Date  . A-fib (HCC)   . Arthritis   . COPD (chronic obstructive pulmonary disease) (HCC)    mild on singulair  . Coronary artery disease 1998; 1996; 2013   PTCA LCX; CABG X 4; RE-do CABG  . GERD (gastroesophageal reflux disease)   . Hernia    umbilical  . Hx of myocardial infarction 1983   medically treated   . Hyperlipidemia   . Hypertension   . OSA on CPAP     Past Surgical History:  Procedure Laterality Date  . ANGIOPLASTY  1993   LCX  . CARDIAC CATHETERIZATION  2008;2013  . CARDIOVERSION N/A 12/25/2012   Procedure: CARDIOVERSION;  Surgeon: Chrystie Nose, MD;  Location: [facility] ENDOSCOPY;  Service: Cardiovascular;  Laterality: N/A;  . CATARACT EXTRACTION    . CORONARY ARTERY BYPASS GRAFT  1996   3 VESSELS  . CORONARY ARTERY BYPASS GRAFT  09/11/2011   Procedure: REDO CORONARY ARTERY BYPASS GRAFTING (CABG);  Surgeon: Delight Ovens, MD;  Location: [facility] OR;  Service: Open Heart Surgery;  Laterality: N/A;  Redo Coronary Artery Bypass Graft times three utilizing the right internal mammary artery and the left  and right greater  saphenous veins.  Marland Kitchen GRAFT(S) ANGIOGRAM  09/01/2011   Procedure: GRAFT(S) Rosalin Hawking;  Surgeon: Marykay Lex, MD;  Location: [facility] CATH LAB;  Service: Cardiovascular;;  . LEFT HEART CATHETERIZATION WITH CORONARY ANGIOGRAM N/A 09/01/2011   Procedure: LEFT HEART CATHETERIZATION WITH CORONARY ANGIOGRAM;  Surgeon: Marykay Lex, MD;  Location: [facility] CATH LAB;  Service: Cardiovascular;  Laterality: N/A;  . TEE WITHOUT CARDIOVERSION  09/11/2011   Procedure: TRANSESOPHAGEAL ECHOCARDIOGRAM (TEE);  Surgeon: Delight Ovens, MD;  Location: [facility] OR;  Service: Open Heart Surgery;  Laterality: N/A;  . TEE WITHOUT CARDIOVERSION N/A 12/25/2012   Procedure: TRANSESOPHAGEAL ECHOCARDIOGRAM (TEE);  Surgeon: Chrystie Nose, MD;  Location: [facility] ENDOSCOPY;  Service: Cardiovascular;  Laterality: N/A;  . UMBILICAL HERNIA REPAIR  04/04/2011   Procedure: HERNIA REPAIR UMBILICAL ADULT;  Surgeon: Adolph Pollack, MD;  Location: [facility] ORS;  Service: General;  Laterality: N/A;  Umbilical Hernia Repair    FAMHx:  Family History  Problem Relation Age of Onset  . Stroke Mother   . Cancer Father     lung  . Cancer Sister     lung  . Cancer Sister     liver    SOCHx:   reports that he quit smoking about 29 years ago. He has never used smokeless tobacco. He reports that he does not drink alcohol or use drugs.  ALLERGIES:  Allergies  Allergen Reactions  . Sulfa Antibiotics Swelling    ROS: Pertinent items noted in HPI and remainder of comprehensive ROS otherwise negative.  HOME MEDS: Current Outpatient Prescriptions  Medication Sig Dispense Refill  . amiodarone (PACERONE) 200 MG tablet take 1 tablet by mouth once daily 30 tablet 6  . aspirin EC 81 MG tablet Take 81 mg by mouth daily.    Marland Kitchen. atorvastatin (LIPITOR) 10 MG tablet TAKE 1 TABLET BY MOUTH ONCE DAILY AT 6:00 30 tablet 4  . Bioflavonoid Products (ESTER C PO) Take 500 mg by mouth daily.      . cetirizine (ZYRTEC) 10 MG tablet Take 10 mg by mouth daily.      .  Cholecalciferol (VITAMIN D-3 PO) Take 1,000 Units by mouth 2 (two) times daily.     Marland Kitchen. ELIQUIS 5 MG TABS tablet TAKE 1 TABLET BY MOUTH 2 TIMES DAILY 60 tablet 5  . ezetimibe (ZETIA) 10 MG tablet Take 1 tablet (10 mg total) by mouth daily. 30 tablet 7  . fluticasone (FLONASE) 50 MCG/ACT nasal spray as needed.  1  . fluticasone (VERAMYST) 27.5 MCG/SPRAY nasal spray Place 1 spray into the nose daily. For allergies    . folic acid (FOLVITE) 1 MG tablet TAKE 1 TABLET BY MOUTH ONCE DAILY 30 tablet 6  . IFEREX 150 150 MG capsule TAKE 1 CAPSULE BY MOUTH ONCE DAILY 30 capsule 5  . metoprolol tartrate (LOPRESSOR) 25 MG tablet Take 12.5 mg by mouth 2 (two) times daily.    . Misc Natural Products (OSTEO BI-FLEX ADV TRIPLE ST PO) Take 1 tablet by mouth daily.     . montelukast (SINGULAIR) 10 MG tablet Take 10 mg by mouth at bedtime.     . Multiple Vitamin (MULTIVITAMIN WITH MINERALS) TABS Take 1 tablet by mouth daily.    . niacin (NIASPAN) 1000 MG CR tablet TAKE 1 TABLET BY MOUTH AT BEDTIME 30 tablet 6  . NITROSTAT 0.4 MG SL tablet PLACE 1 TABLET UNDER THE TONGUE EVERY 5 MINUTES  AS NEEDED FOR CHEST PAIN( MAX 3 DOSES, 5 MINUTES APART) 25 tablet 2  . Omega-3 Fatty Acids (FISH OIL PO) Take 1,000 mg by mouth 2 (two) times daily.      Marland Kitchen. omeprazole (PRILOSEC) 20 MG capsule Take 20 mg by mouth daily.     . valsartan-hydrochlorothiazide (DIOVAN-HCT) 160-12.5 MG tablet take 1 tablet once daily 30 tablet 5   No current facility-administered medications for this visit.     LABS/IMAGING: No results found for this or any previous visit (from the past 48 hour(s)). No results found.  VITALS: BP 119/60 (BP Location: Right Arm, Patient Position: Sitting, Cuff Size: Large)   Pulse (!) 44   Ht 6' 2.5" (1.892 m)   Wt 273 lb (123.8 kg)   BMI 34.58 kg/m   EXAM: General appearance: alert and no distress Neck: no carotid bruit and no JVD Lungs: clear to auscultation bilaterally Heart: regular bradycardia Abdomen:  soft, non-tender; bowel sounds normal; no  masses,  no organomegaly Extremities: extremities normal, atraumatic, no cyanosis or edema Pulses: 2+ and symmetric Skin: Skin color, texture, turgor normal. No rashes or lesions Neurologic: Grossly normal Psych: Mood, affect normal  EKG: Sinus bradycardia at 44, incomplete right bundle branch block, first-degree AV block with PR interval 202 ms  ASSESSMENT: 1. Paroxysmal atrial fibrillation status post TEE/cardioversion 2. Possibly symptomatic sinus bradycardia on low-dose amiodarone and metoprolol 3. Ischemic cardiomyopathy EF 35-40% 4. Hypertension 5. CAD s/p CABG 6. Asymptomatic bradycardia 7. Anticoagulation on Eliquis - CHADSVASC 6 8. OSA on CPAP  PLAN: 1.   Nicholas Lynn says he feels well. Although heart rate remains low, despite a recent decrease in his beta blocker, he remains asymptomatic and can increase heart rate with exercise. He is able to exercise without any fatigue or intolerance. Based on his cardiomyopathy had like to continue him on beta blocker for the heart failure benefit. He is on low-dose amiodarone. Recent laboratory work showed normal thyroid function and he has had well-controlled cholesterol. He is sleeping well with CPAP. No changes to his medicines at this time plan to see him back in 6 months or sooner as necessary.   Chrystie Nose, MD, [facility] Attending Cardiologist [facility]  Lisette Abu Hilty 09/10/2015, 1:12 PM

## 2015-09-10 NOTE — Patient Instructions (Signed)
Your physician recommends that you continue on your current medications as directed. Please refer to the Current Medication list given to you today.  Your physician wants you to follow-up in: 6 months with Dr. Hilty. You will receive a reminder letter in the mail two months in advance. If you don't receive a letter, please call our office to schedule the follow-up appointment.  

## 2015-09-15 ENCOUNTER — Other Ambulatory Visit: Payer: Self-pay | Admitting: Internal Medicine

## 2015-09-15 NOTE — Telephone Encounter (Signed)
Rx(s) sent to pharmacy electronically.  

## 2015-10-20 ENCOUNTER — Other Ambulatory Visit: Payer: Self-pay | Admitting: Internal Medicine

## 2015-10-20 NOTE — Telephone Encounter (Signed)
Rx(s) sent to pharmacy electronically.  

## 2015-12-19 ENCOUNTER — Other Ambulatory Visit: Payer: Self-pay | Admitting: Internal Medicine

## 2015-12-20 NOTE — Telephone Encounter (Signed)
REFILL 

## 2016-02-19 ENCOUNTER — Other Ambulatory Visit: Payer: Self-pay | Admitting: Internal Medicine

## 2016-02-21 ENCOUNTER — Emergency Department (HOSPITAL_COMMUNITY): Payer: Medicare Other

## 2016-02-21 ENCOUNTER — Emergency Department (HOSPITAL_COMMUNITY)
Admission: EM | Admit: 2016-02-21 | Discharge: 2016-02-21 | Disposition: A | Discharge status: Home / Self Care | Payer: Medicare Other | Attending: Emergency Medicine | Admitting: Emergency Medicine

## 2016-02-21 ENCOUNTER — Encounter (HOSPITAL_COMMUNITY): Payer: Self-pay | Admitting: *Deleted

## 2016-02-21 DIAGNOSIS — Z7982 Long term (current) use of aspirin: Secondary | ICD-10-CM | POA: Diagnosis not present

## 2016-02-21 DIAGNOSIS — Z951 Presence of aortocoronary bypass graft: Secondary | ICD-10-CM | POA: Diagnosis not present

## 2016-02-21 DIAGNOSIS — Z7901 Long term (current) use of anticoagulants: Secondary | ICD-10-CM | POA: Diagnosis not present

## 2016-02-21 DIAGNOSIS — I4891 Unspecified atrial fibrillation: Secondary | ICD-10-CM | POA: Diagnosis present

## 2016-02-21 DIAGNOSIS — I10 Essential (primary) hypertension: Secondary | ICD-10-CM | POA: Insufficient documentation

## 2016-02-21 DIAGNOSIS — I2510 Atherosclerotic heart disease of native coronary artery without angina pectoris: Secondary | ICD-10-CM | POA: Insufficient documentation

## 2016-02-21 DIAGNOSIS — I480 Paroxysmal atrial fibrillation: Secondary | ICD-10-CM | POA: Diagnosis not present

## 2016-02-21 DIAGNOSIS — Z79899 Other long term (current) drug therapy: Secondary | ICD-10-CM | POA: Insufficient documentation

## 2016-02-21 DIAGNOSIS — Z87891 Personal history of nicotine dependence: Secondary | ICD-10-CM | POA: Diagnosis not present

## 2016-02-21 DIAGNOSIS — R0789 Other chest pain: Secondary | ICD-10-CM | POA: Insufficient documentation

## 2016-02-21 DIAGNOSIS — J449 Chronic obstructive pulmonary disease, unspecified: Secondary | ICD-10-CM | POA: Insufficient documentation

## 2016-02-21 DIAGNOSIS — I252 Old myocardial infarction: Secondary | ICD-10-CM | POA: Diagnosis not present

## 2016-02-21 LAB — I-STAT TROPONIN, ED
Troponin i, poc: 0 ng/mL (ref 0.00–0.08)
Troponin i, poc: 0 ng/mL (ref 0.00–0.08)

## 2016-02-21 LAB — CBC
HEMATOCRIT: 47.7 % (ref 39.0–52.0)
Hemoglobin: 16 g/dL (ref 13.0–17.0)
MCH: 30.5 pg (ref 26.0–34.0)
MCHC: 33.5 g/dL (ref 30.0–36.0)
MCV: 90.9 fL (ref 78.0–100.0)
Platelets: 170 10*3/uL (ref 150–400)
RBC: 5.25 MIL/uL (ref 4.22–5.81)
RDW: 13.9 % (ref 11.5–15.5)
WBC: 8 10*3/uL (ref 4.0–10.5)

## 2016-02-21 LAB — BASIC METABOLIC PANEL
Anion gap: 8 (ref 5–15)
BUN: 12 mg/dL (ref 6–20)
CO2: 27 mmol/L (ref 22–32)
CREATININE: 1.21 mg/dL (ref 0.61–1.24)
Calcium: 9.6 mg/dL (ref 8.9–10.3)
Chloride: 104 mmol/L (ref 101–111)
GFR calc Af Amer: >60 mL/min (ref >60)
GFR, EST NON AFRICAN AMERICAN: 58 mL/min — AB (ref >60)
GLUCOSE: 114 mg/dL — AB (ref 65–99)
POTASSIUM: 4.2 mmol/L (ref 3.5–5.1)
Sodium: 139 mmol/L (ref 135–145)

## 2016-02-21 NOTE — ED Triage Notes (Signed)
PT states acute onset "palpitations" that he felt were afib.  For an hour afterward he experienced L sided chest pain, which has now subsided, even after walking in from parking lot.

## 2016-02-21 NOTE — Discharge Instructions (Signed)
Read the information below.  You may return to the Emergency Department at any time for worsening condition or any new symptoms that concern you.   If you develop worsening chest pain, shortness of breath, fever, you pass out, or become weak or dizzy, return to the ER for a recheck.    °

## 2016-02-21 NOTE — ED Provider Notes (Signed)
[facility]-EMERGENCY DEPT Provider Note   CSN: [id number] Arrival date & time: 02/21/16  1145     History   Chief Complaint Chief Complaint  Patient presents with  . Chest Pain  . Atrial Fibrillation    HPI Nicholas Lynn is a 71 y.o. male.  HPI   Pt with hx paroxysmal afib, CAD s/p CABG x 2, MI, on eliquis and amiodarone p/w episode of afib this morning that lasted approximately 1 minute, occurred at 8:30am.  About 30 minutes later he developed central chest tightness that lasted 30-40 minutes and resolved.  Felt like someone had their thumb pushed into his chest.  Did not feel like his previous MI.  Denies SOB, lightheadedness/dizziness, diaphoresis, nausea, leg swelling.  Has not had any recurrent symptoms.    Past Medical History:  Diagnosis Date  . A-fib (HCC)   . Arthritis   . COPD (chronic obstructive pulmonary disease) (HCC)    mild on singulair  . Coronary artery disease 1998; 1996; 2013   PTCA LCX; CABG X 4; RE-do CABG  . GERD (gastroesophageal reflux disease)   . Hernia    umbilical  . Hx of myocardial infarction 1983   medically treated   . Hyperlipidemia   . Hypertension   . OSA on CPAP     Patient Active Problem List   Diagnosis Date Noted  . Bradycardia 01/07/2013  . Chronic anticoagulation 01/01/2013  . Obesity (BMI 30-39.9)   . Ischemic cardiomyopathy, "moderate LVD" at cath 09/01/11 09/13/2011  . PAF recurrent- s/p recent DCCV on Amiodarone 12/26/12   . CAD, CABG '96, re do CABG 09/11/11 X 3 09/01/2011  . Dyslipidemia 09/01/2011  . Sleep apnea, on C-pap  09/01/2011  . HTN (hypertension)     Past Surgical History:  Procedure Laterality Date  . ANGIOPLASTY  1993   LCX  . CARDIAC CATHETERIZATION  2008;2013  . CARDIOVERSION N/A 12/25/2012   Procedure: CARDIOVERSION;  Surgeon: Chrystie Nose, MD;  Location: [facility] ENDOSCOPY;  Service: Cardiovascular;  Laterality: N/A;  . CATARACT EXTRACTION    . CORONARY ARTERY BYPASS GRAFT  1996   3 VESSELS  .  CORONARY ARTERY BYPASS GRAFT  09/11/2011   Procedure: REDO CORONARY ARTERY BYPASS GRAFTING (CABG);  Surgeon: Delight Ovens, MD;  Location: [facility] OR;  Service: Open Heart Surgery;  Laterality: N/A;  Redo Coronary Artery Bypass Graft times three utilizing the right internal mammary artery and the left  and right greater saphenous veins.  Marland Kitchen GRAFT(S) ANGIOGRAM  09/01/2011   Procedure: GRAFT(S) Rosalin Hawking;  Surgeon: Marykay Lex, MD;  Location: [facility] CATH LAB;  Service: Cardiovascular;;  . LEFT HEART CATHETERIZATION WITH CORONARY ANGIOGRAM N/A 09/01/2011   Procedure: LEFT HEART CATHETERIZATION WITH CORONARY ANGIOGRAM;  Surgeon: Marykay Lex, MD;  Location: [facility] CATH LAB;  Service: Cardiovascular;  Laterality: N/A;  . TEE WITHOUT CARDIOVERSION  09/11/2011   Procedure: TRANSESOPHAGEAL ECHOCARDIOGRAM (TEE);  Surgeon: Delight Ovens, MD;  Location: [facility] OR;  Service: Open Heart Surgery;  Laterality: N/A;  . TEE WITHOUT CARDIOVERSION N/A 12/25/2012   Procedure: TRANSESOPHAGEAL ECHOCARDIOGRAM (TEE);  Surgeon: Chrystie Nose, MD;  Location: [facility] ENDOSCOPY;  Service: Cardiovascular;  Laterality: N/A;  . UMBILICAL HERNIA REPAIR  04/04/2011   Procedure: HERNIA REPAIR UMBILICAL ADULT;  Surgeon: Adolph Pollack, MD;  Location: [facility] ORS;  Service: General;  Laterality: N/A;  Umbilical Hernia Repair       Home Medications    Prior to Admission medications   Medication Sig Start  Date End Date Taking? Authorizing Provider  amiodarone (PACERONE) 200 MG tablet take 1 tablet by mouth once daily 08/17/15  Yes Chrystie Nose, MD  aspirin EC 81 MG tablet Take 81 mg by mouth daily.   Yes Historical Provider, MD  atorvastatin (LIPITOR) 10 MG tablet TAKE 1 TABLET BY MOUTH ONCE DAILY AT 6:00PM 12/20/15  Yes Chrystie Nose, MD  Bioflavonoid Products (ESTER C PO) Take 500 mg by mouth daily.     Yes Historical Provider, MD  cetirizine (ZYRTEC) 10 MG tablet Take 10 mg by mouth daily.     Yes Historical Provider, MD    Cholecalciferol (VITAMIN D-3 PO) Take 1,000 Units by mouth 2 (two) times daily.    Yes Historical Provider, MD  ELIQUIS 5 MG TABS tablet TAKE 1 TABLET BY MOUTH 2 TIMES DAILY 08/17/15  Yes Chrystie Nose, MD  ezetimibe (ZETIA) 10 MG tablet Take 1 tablet (10 mg total) by mouth daily. 08/09/15  Yes Chrystie Nose, MD  fluticasone (FLONASE) 50 MCG/ACT nasal spray Place 1 spray into both nostrils every morning.  03/02/14  Yes Historical Provider, MD  folic acid (FOLVITE) 1 MG tablet take 1 tablet by mouth once daily 10/20/15  Yes Chrystie Nose, MD  metoprolol tartrate (LOPRESSOR) 25 MG tablet Take 0.5 tablets (12.5 mg total) by mouth 2 (two) times daily. 09/15/15  Yes Chrystie Nose, MD  Misc Natural Products (OSTEO BI-FLEX ADV TRIPLE ST PO) Take 1 tablet by mouth daily.    Yes Historical Provider, MD  montelukast (SINGULAIR) 10 MG tablet Take 10 mg by mouth at bedtime.    Yes Historical Provider, MD  Multiple Vitamin (MULTIVITAMIN WITH MINERALS) TABS Take 1 tablet by mouth daily.   Yes Historical Provider, MD  niacin (NIASPAN) 1000 MG CR tablet take 1 tablet by mouth at bedtime 10/20/15  Yes Chrystie Nose, MD  NITROSTAT 0.4 MG SL tablet PLACE 1 TABLET UNDER THE TONGUE EVERY 5 MINUTES  AS NEEDED FOR CHEST PAIN( MAX 3 DOSES, 5 MINUTES APART) 09/02/14  Yes Chrystie Nose, MD  Omega-3 Fatty Acids (FISH OIL PO) Take 1,000 mg by mouth 2 (two) times daily.     Yes Historical Provider, MD  omeprazole (PRILOSEC) 20 MG capsule Take 20 mg by mouth daily.    Yes Historical Provider, MD  POLY-IRON 150 150 MG capsule TAKE 1 CAPSULE BY MOUTH ONCE DAILY 09/15/15  Yes Chrystie Nose, MD  valsartan-hydrochlorothiazide (DIOVAN-HCT) 160-12.5 MG tablet take 1 tablet once daily 10/20/15  Yes Chrystie Nose, MD    Family History Family History  Problem Relation Age of Onset  . Stroke Mother   . Cancer Father     lung  . Cancer Sister     lung  . Cancer Sister     liver    Social History Social History   Substance Use Topics  . Smoking status: Former Smoker    Quit date: 03/27/1986  . Smokeless tobacco: Never Used  . Alcohol use No     Allergies   Sulfa antibiotics   Review of Systems Review of Systems  All other systems reviewed and are negative.    Physical Exam Updated Vital Signs BP 117/64   Pulse (!) 47   Temp 98.5 F (36.9 C) (Oral)   Resp 16   Ht 6' 2.5" (1.892 m)   Wt 123.4 kg   SpO2 95%   BMI 34.46 kg/m   Physical Exam  Constitutional: He appears  well-developed and well-nourished. No distress.  HENT:  Head: Normocephalic and atraumatic.  Neck: Neck supple.  Cardiovascular: Normal rate and regular rhythm.   Pulmonary/Chest: Effort normal and breath sounds normal. No respiratory distress. He has no wheezes. He has no rales.  Abdominal: Soft. He exhibits no distension and no mass. There is no tenderness. There is no rebound and no guarding.  Neurological: He is alert. He exhibits normal muscle tone.  Skin: He is not diaphoretic.  Nursing note and vitals reviewed.    ED Treatments / Results  Labs (all labs ordered are listed, but only abnormal results are displayed) Labs Reviewed  BASIC METABOLIC PANEL - Abnormal; Notable for the following:       Result Value   Glucose, Bld 114 (*)    GFR calc non Af Amer 58 (*)    All other components within normal limits  CBC  I-STAT TROPOININ, ED  I-STAT TROPOININ, ED    EKG  EKG Interpretation  Date/Time:  Monday February 21 2016 12:01:45 EST Ventricular Rate:  48 PR Interval:  184 QRS Duration: 118 QT Interval:  544 QTC Calculation: 485 R Axis:   -31 Text Interpretation:  Sinus bradycardia Left axis deviation RSR' or QR pattern in V1 suggests right ventricular conduction delay Septal infarct , age undetermined Abnormal ECG Since prior ECG in 2014  has new right ventricular conduction delay, no significant ST changes Confirmed by [facility] MD, ERIN ([id number]) on 02/21/2016 4:52:24 PM       Radiology Dg  Chest 2 View  Result Date: 02/21/2016 CLINICAL DATA:  71 year old hypertensive male with palpitations. Left chest pain. Initial encounter. Initial encounter. EXAM: CHEST  2 VIEW COMPARISON:  08/18/2013 FINDINGS: Post CABG.  Heart size within normal limits. Calcified mildly tortuous aorta. Central pulmonary vascular prominence and minimal chronic peribronchial thickening stable. No infiltrate, congestive heart failure or pneumothorax. No plain film evidence of pulmonary malignancy. No acute osseous abnormality. IMPRESSION: No acute pulmonary abnormality. Tortuous aorta. Electronically Signed   By: Lacy Duverney M.D.   On: 02/21/2016 12:52    Procedures Procedures (including critical care time)  Medications Ordered in ED Medications - No data to display   Initial Impression / Assessment and Plan / ED Course  I have reviewed the triage vital signs and the nursing notes.  Pertinent labs & imaging results that were available during my care of the patient were reviewed by me and considered in my medical decision making (see chart for details).    Afebrile, nontoxic patient with likely episode of afib that lasted 1 minute this morning.  He is on amiodarone and  Eliquis.  Also very localized chest pressure that occurred around 9am, atypical.  Doubt ACS, PE, PNA.  Workup reassuring.  No further symptoms.  Pt also seen by Dr Dalene Seltzer.   D/C home with cardiology follow up.  Discussed result, findings, treatment, and follow up  with patient.  Pt given return precautions.  Pt verbalizes understanding and agrees with plan.       Final Clinical Impressions(s) / ED Diagnoses   Final diagnoses:  Paroxysmal atrial fibrillation [facility])  Atypical chest pain    New Prescriptions New Prescriptions   No medications on file     Annetta North, PA-C 02/21/16 1714    Alvira Monday, MD 02/22/16 1722

## 2016-02-25 ENCOUNTER — Ambulatory Visit: Payer: Medicare Other | Admitting: Physician Assistant

## 2016-03-01 ENCOUNTER — Ambulatory Visit (INDEPENDENT_AMBULATORY_CARE_PROVIDER_SITE_OTHER): Payer: Medicare Other | Admitting: Physician Assistant

## 2016-03-01 ENCOUNTER — Encounter: Payer: Self-pay | Admitting: Physician Assistant

## 2016-03-01 VITALS — BP 144/80 | HR 59 | Ht 74.5 in | Wt 275.0 lb

## 2016-03-01 DIAGNOSIS — I25810 Atherosclerosis of coronary artery bypass graft(s) without angina pectoris: Secondary | ICD-10-CM

## 2016-03-01 DIAGNOSIS — I480 Paroxysmal atrial fibrillation: Secondary | ICD-10-CM

## 2016-03-01 DIAGNOSIS — E785 Hyperlipidemia, unspecified: Secondary | ICD-10-CM

## 2016-03-01 DIAGNOSIS — I10 Essential (primary) hypertension: Secondary | ICD-10-CM | POA: Diagnosis not present

## 2016-03-01 DIAGNOSIS — Z9989 Dependence on other enabling machines and devices: Secondary | ICD-10-CM

## 2016-03-01 DIAGNOSIS — R079 Chest pain, unspecified: Secondary | ICD-10-CM | POA: Diagnosis not present

## 2016-03-01 DIAGNOSIS — G4733 Obstructive sleep apnea (adult) (pediatric): Secondary | ICD-10-CM

## 2016-03-01 NOTE — Patient Instructions (Signed)
Medication Instructions:    Your physician recommends that you continue on your current medications as directed. Please refer to the Current Medication list given to you today.    If you need a refill on your cardiac medications before your next appointment, please call your pharmacy.  Labwork: NONE ORDERED  TODAY    Testing/Procedures: NONE ORDERED  TODAY    Follow-Up: IN 3 MONTHS WITH DR HILTY     Any Other Special Instructions Will Be Listed Below (If Applicable).

## 2016-03-01 NOTE — Progress Notes (Signed)
Cardiology Office Note    Date:  03/01/2016   ID:  Nicholas Lynn, DOB 09-06-1945, MRN [id number]  PCP:  [name], MD  Cardiologist:  Dr. Rennis Golden  Chief Complaint  Patient presents with  . Follow-[facility]    [facility], recent ED visit for chest pain    History of Present Illness:  Nicholas Lynn is a 71 y.o. male with PMH of CAD s/p CABG 1996 with redo CABG 08/2011, PAF on eliquis and amiodarone, HTN, HLD, and OSA on CPAP. She had a previous anterior infarction in 1988 treated with angioplasty. This was followed by bypass surgery in 1996 and redo bypass in August 2013. He was on both amiodarone and Coumadin, his Coumadin was discontinued due to lack of recurrence, however later started on eliquis due to recurrence of atrial fibrillation. He had a TEE DC cardioversion in December 2014. Echocardiogram obtained on 09/09/2014 continued to show EF 35-40%, akinesis of mid apical anterior, mid apical anteroseptal, apical inferior, apical inferoseptal myocardium consistent with prior infarction in the LAD distribution. 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 He has been having asymptomatic bradycardia despite the decrease of amiodarone dosage.   He recently presented to the ED on 02/21/2016 with episode of paroxysmal atrial fibrillation that lasted approximately 1 minute. 30 minutes later he developed central chest tightness which lasted roughly 30-40 minutes. By the time he went to the ED, it has already resolved. Apparently the characteristic of the chest pain was different compared to the previous MI. He was subsequently discharged from the emergency room.  He presents today for follow-up, he says the total duration of chest pain was not 30-40 minutes as mentioned by ED physician, instead, it was only 3 minutes. He has not had any chest pain since. Also he  exercised on the treadmill up to 30 minutes a day for 3 days a week. The last time he exercised was yesterday. He has not noticed any exertional type of symptom. At this time I do not recommend any further ischemic workup given the lack of recurrence of his symptom.   Past Medical History:  Diagnosis Date  . A-fib (HCC)   . Arthritis   . COPD (chronic obstructive pulmonary disease) (HCC)    mild on singulair  . Coronary artery disease 1998; 1996; 2013   PTCA LCX; CABG X 4; RE-do CABG  . GERD (gastroesophageal reflux disease)   . Hernia    umbilical  . Hx of myocardial infarction 1983   medically treated   . Hyperlipidemia   . Hypertension   . OSA on CPAP     Past Surgical History:  Procedure Laterality Date  . ANGIOPLASTY  1993   LCX  . CARDIAC CATHETERIZATION  2008;2013  . CARDIOVERSION N/A 12/25/2012   Procedure: CARDIOVERSION;  Surgeon: Chrystie Nose, MD;  Location: [facility] ENDOSCOPY;  Service: Cardiovascular;  Laterality: N/A;  . CATARACT EXTRACTION    . CORONARY ARTERY BYPASS GRAFT  1996   3 VESSELS  . CORONARY ARTERY BYPASS GRAFT  09/11/2011   Procedure: REDO CORONARY ARTERY BYPASS GRAFTING (CABG);  Surgeon: Delight Ovens, MD;  Location: [facility] OR;  Service: Open Heart Surgery;  Laterality: N/A;  Redo Coronary Artery Bypass Graft times three utilizing the right internal mammary artery and the left  and right greater saphenous veins.  Marland Kitchen GRAFT(S) ANGIOGRAM  09/01/2011  Procedure: GRAFT(S) ANGIOGRAM;  Surgeon: Marykay Lex, MD;  Location: [facility] CATH LAB;  Service: Cardiovascular;;  . LEFT HEART CATHETERIZATION WITH CORONARY ANGIOGRAM N/A 09/01/2011   Procedure: LEFT HEART CATHETERIZATION WITH CORONARY ANGIOGRAM;  Surgeon: Marykay Lex, MD;  Location: [facility] CATH LAB;  Service: Cardiovascular;  Laterality: N/A;  . TEE WITHOUT CARDIOVERSION  09/11/2011   Procedure: TRANSESOPHAGEAL ECHOCARDIOGRAM (TEE);  Surgeon: Delight Ovens, MD;  Location: [facility] OR;  Service: Open Heart Surgery;   Laterality: N/A;  . TEE WITHOUT CARDIOVERSION N/A 12/25/2012   Procedure: TRANSESOPHAGEAL ECHOCARDIOGRAM (TEE);  Surgeon: Chrystie Nose, MD;  Location: [facility] ENDOSCOPY;  Service: Cardiovascular;  Laterality: N/A;  . UMBILICAL HERNIA REPAIR  04/04/2011   Procedure: HERNIA REPAIR UMBILICAL ADULT;  Surgeon: Adolph Pollack, MD;  Location: [facility] ORS;  Service: General;  Laterality: N/A;  Umbilical Hernia Repair    Current Medications: Outpatient Medications Prior to Visit  Medication Sig Dispense Refill  . amiodarone (PACERONE) 200 MG tablet take 1 tablet by mouth once daily 30 tablet 6  . aspirin EC 81 MG tablet Take 81 mg by mouth daily.    Marland Kitchen atorvastatin (LIPITOR) 10 MG tablet TAKE 1 TABLET BY MOUTH ONCE DAILY AT 6:00PM 30 tablet 3  . Bioflavonoid Products (ESTER C PO) Take 500 mg by mouth daily.      . cetirizine (ZYRTEC) 10 MG tablet Take 10 mg by mouth daily.      . Cholecalciferol (VITAMIN D-3 PO) Take 1,000 Units by mouth 2 (two) times daily.     Marland Kitchen ELIQUIS 5 MG TABS tablet take 1 tablet by mouth twice a day 180 tablet 1  . ezetimibe (ZETIA) 10 MG tablet Take 1 tablet (10 mg total) by mouth daily. 30 tablet 7  . fluticasone (FLONASE) 50 MCG/ACT nasal spray Place 1 spray into both nostrils every morning.   1  . folic acid (FOLVITE) 1 MG tablet take 1 tablet by mouth once daily 30 tablet 10  . metoprolol tartrate (LOPRESSOR) 25 MG tablet Take 0.5 tablets (12.5 mg total) by mouth 2 (two) times daily. 30 tablet 11  . Misc Natural Products (OSTEO BI-FLEX ADV TRIPLE ST PO) Take 1 tablet by mouth daily.     . montelukast (SINGULAIR) 10 MG tablet Take 10 mg by mouth at bedtime.     . Multiple Vitamin (MULTIVITAMIN WITH MINERALS) TABS Take 1 tablet by mouth daily.    . niacin (NIASPAN) 1000 MG CR tablet take 1 tablet by mouth at bedtime 30 tablet 10  . NITROSTAT 0.4 MG SL tablet PLACE 1 TABLET UNDER THE TONGUE EVERY 5 MINUTES  AS NEEDED FOR CHEST PAIN( MAX 3 DOSES, 5 MINUTES APART) 25 tablet 2  .  Omega-3 Fatty Acids (FISH OIL PO) Take 1,000 mg by mouth 2 (two) times daily.      Marland Kitchen omeprazole (PRILOSEC) 20 MG capsule Take 20 mg by mouth daily.     Marland Kitchen POLY-IRON 150 150 MG capsule TAKE 1 CAPSULE BY MOUTH ONCE DAILY 30 capsule 11  . valsartan-hydrochlorothiazide (DIOVAN-HCT) 160-12.5 MG tablet take 1 tablet once daily 30 tablet 10   No facility-administered medications prior to visit.      Allergies:   Sulfa antibiotics   Social History   Social History  . Marital status: Married    Spouse name: N/A  . Number of children: N/A  . Years of education: N/A   Social History Main Topics  . Smoking status: Former Engineer, civil (consulting)  date: 03/27/1986  . Smokeless tobacco: Never Used  . Alcohol use No  . Drug use: No  . Sexual activity: Not Asked   Other Topics Concern  . None   Social History Narrative   Retired Psychologist, occupational     Family History:  The patient's family history includes Cancer in his father, sister, and sister; Stroke in his mother.   ROS:   Please see the history of present illness.    ROS All other systems reviewed and are negative.   PHYSICAL EXAM:   VS:  BP (!) 144/80 (BP Location: Right Arm, Patient Position: Sitting, Cuff Size: Normal)   Pulse (!) 59   Ht 6' 2.5" (1.892 m)   Wt 275 lb (124.7 kg)   BMI 34.84 kg/m    GEN: Well nourished, well developed, in no acute distress  HEENT: normal  Neck: no JVD, carotid bruits, or masses Cardiac: RRR; no murmurs, rubs, or gallops,no edema  Respiratory:  clear to auscultation bilaterally, normal work of breathing GI: soft, nontender, nondistended, + BS MS: no deformity or atrophy  Skin: warm and dry, no rash Neuro:  Alert and Oriented x 3, Strength and sensation are intact Psych: euthymic mood, full affect  Wt Readings from Last 3 Encounters:  03/01/16 275 lb (124.7 kg)  02/21/16 272 lb (123.4 kg)  09/10/15 273 lb (123.8 kg)      Studies/Labs Reviewed:   EKG:  EKG is not ordered today.   Recent  Labs: 03/08/2015: Brain Natriuretic Peptide 124.8; TSH 1.40 02/21/2016: BUN 12; Creatinine, Ser 1.21; Hemoglobin 16.0; Platelets 170; Potassium 4.2; Sodium 139   Lipid Panel No results found for: CHOL, TRIG, HDL, CHOLHDL, VLDL, LDLCALC, LDLDIRECT  Additional studies/ records that were reviewed today include:    Echo 10/2015      ASSESSMENT:    1. Chest pain at rest   2. Coronary artery disease involving coronary bypass graft of native heart without angina pectoris   3. PAF (paroxysmal atrial fibrillation) (HCC)   4. Essential hypertension   5. Hyperlipidemia, unspecified hyperlipidemia type   6. OSA on CPAP      PLAN:  In order of problems listed above:  1. Chest pain: He only had a transient episode of chest pain, there has been no recurrence despite going back on treadmill 3 times a week. I would not recommend any further workup at this time.  2. CAD s/p CABG 1996 with redo CABG 08/2011: No obvious angina, recent chest pain was transient and does not warrant any further workup unless it recurs.  3. PAF on amiodarone and eliquis: Despite occasional atrial fibrillation, amiodarone seems to be managing his atrial fibrillation quite well. Will continue on the current dose.  4. Hypertension: Continue on metoprolol and valsartan/hydrochlorothiazide. His bradycardia does not allow any up titration of beta blocker.  5. Hyperlipidemia: Continue on Lipitor 10 mg daily  6. OSA on CPAP:  Continue on CPAP    Medication Adjustments/Labs and Tests Ordered: Current medicines are reviewed at length with the patient today.  Concerns regarding medicines are outlined above.  Medication changes, Labs and Tests ordered today are listed in the Patient Instructions below. Patient Instructions  Medication Instructions:    Your physician recommends that you continue on your current medications as directed. Please refer to the Current Medication list given to you today.    If you need a  refill on your cardiac medications before your next appointment, please call your pharmacy.  Labwork: NONE ORDERED  TODAY    Testing/Procedures: NONE ORDERED  TODAY    Follow-Up: IN 3 MONTHS WITH DR HILTY     Any Other Special Instructions Will Be Listed Below (If Applicable).                                                                                                                                                      Ramond Dial, [place]  03/01/2016 10:57 PM    [facility] [address] [place], [place], [place]  [address] Phone: [phone]; Fax: [phone]

## 2016-03-06 ENCOUNTER — Observation Stay (HOSPITAL_COMMUNITY)
Admission: EM | Admit: 2016-03-06 | Discharge: 2016-03-07 | Disposition: A | Discharge status: Home / Self Care | Payer: Medicare Other | Attending: Internal Medicine | Admitting: Internal Medicine

## 2016-03-06 ENCOUNTER — Encounter (HOSPITAL_COMMUNITY): Payer: Self-pay | Admitting: Emergency Medicine

## 2016-03-06 ENCOUNTER — Emergency Department (HOSPITAL_COMMUNITY): Payer: Medicare Other

## 2016-03-06 DIAGNOSIS — I480 Paroxysmal atrial fibrillation: Secondary | ICD-10-CM | POA: Diagnosis present

## 2016-03-06 DIAGNOSIS — Z7951 Long term (current) use of inhaled steroids: Secondary | ICD-10-CM | POA: Insufficient documentation

## 2016-03-06 DIAGNOSIS — K219 Gastro-esophageal reflux disease without esophagitis: Secondary | ICD-10-CM | POA: Diagnosis not present

## 2016-03-06 DIAGNOSIS — I252 Old myocardial infarction: Secondary | ICD-10-CM | POA: Diagnosis not present

## 2016-03-06 DIAGNOSIS — Z87891 Personal history of nicotine dependence: Secondary | ICD-10-CM | POA: Insufficient documentation

## 2016-03-06 DIAGNOSIS — R079 Chest pain, unspecified: Principal | ICD-10-CM | POA: Diagnosis present

## 2016-03-06 DIAGNOSIS — Z79899 Other long term (current) drug therapy: Secondary | ICD-10-CM | POA: Diagnosis not present

## 2016-03-06 DIAGNOSIS — I5042 Chronic combined systolic (congestive) and diastolic (congestive) heart failure: Secondary | ICD-10-CM | POA: Diagnosis not present

## 2016-03-06 DIAGNOSIS — I255 Ischemic cardiomyopathy: Secondary | ICD-10-CM | POA: Diagnosis not present

## 2016-03-06 DIAGNOSIS — Z9861 Coronary angioplasty status: Secondary | ICD-10-CM | POA: Insufficient documentation

## 2016-03-06 DIAGNOSIS — R002 Palpitations: Secondary | ICD-10-CM

## 2016-03-06 DIAGNOSIS — J449 Chronic obstructive pulmonary disease, unspecified: Secondary | ICD-10-CM | POA: Diagnosis not present

## 2016-03-06 DIAGNOSIS — I2510 Atherosclerotic heart disease of native coronary artery without angina pectoris: Secondary | ICD-10-CM | POA: Diagnosis present

## 2016-03-06 DIAGNOSIS — Z7982 Long term (current) use of aspirin: Secondary | ICD-10-CM | POA: Insufficient documentation

## 2016-03-06 DIAGNOSIS — I110 Hypertensive heart disease with heart failure: Secondary | ICD-10-CM | POA: Diagnosis not present

## 2016-03-06 DIAGNOSIS — R739 Hyperglycemia, unspecified: Secondary | ICD-10-CM | POA: Insufficient documentation

## 2016-03-06 DIAGNOSIS — Z951 Presence of aortocoronary bypass graft: Secondary | ICD-10-CM | POA: Insufficient documentation

## 2016-03-06 DIAGNOSIS — E785 Hyperlipidemia, unspecified: Secondary | ICD-10-CM | POA: Diagnosis not present

## 2016-03-06 DIAGNOSIS — Z9989 Dependence on other enabling machines and devices: Secondary | ICD-10-CM

## 2016-03-06 DIAGNOSIS — Z7901 Long term (current) use of anticoagulants: Secondary | ICD-10-CM | POA: Diagnosis not present

## 2016-03-06 DIAGNOSIS — R001 Bradycardia, unspecified: Secondary | ICD-10-CM | POA: Diagnosis not present

## 2016-03-06 DIAGNOSIS — G4730 Sleep apnea, unspecified: Secondary | ICD-10-CM | POA: Diagnosis not present

## 2016-03-06 DIAGNOSIS — G4733 Obstructive sleep apnea (adult) (pediatric): Secondary | ICD-10-CM | POA: Diagnosis not present

## 2016-03-06 LAB — BASIC METABOLIC PANEL
ANION GAP: 13 (ref 5–15)
BUN: 14 mg/dL (ref 6–20)
CHLORIDE: 103 mmol/L (ref 101–111)
CO2: 21 mmol/L — AB (ref 22–32)
Calcium: 9.5 mg/dL (ref 8.9–10.3)
Creatinine, Ser: 1.08 mg/dL (ref 0.61–1.24)
GFR calc non Af Amer: >60 mL/min (ref >60)
Glucose, Bld: 160 mg/dL — ABNORMAL HIGH (ref 65–99)
Potassium: 4.1 mmol/L (ref 3.5–5.1)
SODIUM: 137 mmol/L (ref 135–145)

## 2016-03-06 LAB — CBC
HCT: 45.8 % (ref 39.0–52.0)
HEMOGLOBIN: 15.6 g/dL (ref 13.0–17.0)
MCH: 30.8 pg (ref 26.0–34.0)
MCHC: 34.1 g/dL (ref 30.0–36.0)
MCV: 90.5 fL (ref 78.0–100.0)
Platelets: 161 10*3/uL (ref 150–400)
RBC: 5.06 MIL/uL (ref 4.22–5.81)
RDW: 13.6 % (ref 11.5–15.5)
WBC: 11.1 10*3/uL — AB (ref 4.0–10.5)

## 2016-03-06 LAB — I-STAT TROPONIN, ED: TROPONIN I, POC: 0 ng/mL (ref 0.00–0.08)

## 2016-03-06 LAB — D-DIMER, QUANTITATIVE (NOT AT ARMC): D DIMER QUANT: 0.3 ug{FEU}/mL (ref 0.00–0.50)

## 2016-03-06 LAB — MAGNESIUM: MAGNESIUM: 1.9 mg/dL (ref 1.7–2.4)

## 2016-03-06 MED ORDER — ASPIRIN 81 MG PO CHEW
324.0000 mg | CHEWABLE_TABLET | Freq: Once | ORAL | Status: AC
  Administered 2016-03-07: 324 mg via ORAL
  Filled 2016-03-06: qty 4

## 2016-03-06 MED ORDER — NITROGLYCERIN 0.4 MG SL SUBL
0.4000 mg | SUBLINGUAL_TABLET | SUBLINGUAL | Status: DC | PRN
  Administered 2016-03-07 (×2): 0.4 mg via SUBLINGUAL
  Filled 2016-03-06: qty 1

## 2016-03-06 NOTE — ED Triage Notes (Signed)
Pt sts palpitations starting 30 min ago; pt sts hx of afib

## 2016-03-06 NOTE — ED Provider Notes (Addendum)
By signing my name below, I, Avnee Patel, attest that this documentation has been prepared under the direction and in the presence of Layla Maw Ward, DO  Electronically Signed: Clovis Pu, ED Scribe. 03/06/16. 11:45 PM.  TIME SEEN: 11:36 PM  CHIEF COMPLAINT:  Chief Complaint  Patient presents with  . Palpitations    HPI:   Nicholas Lynn is a 71 y.o. male, with a PMHx of A-fib on Eliquis, CAD, HTN, MI and a PSHx of CABG in 2013, who presents to the Emergency Department complaining of acute onset episode of palpitations which lasted about 10 minutes onset in the afternoon today. Pt also reports chest pain which he describes as a soreness and pressure, SOB which has since resolved and lightheadedness. His chest pain is worse with deep breathing and upon palpation but also feels somewhat like his prior MI. No alleviating factors noted. Pt denies current SOB, nausea, vomiting, diaphoresis or any other associated symptoms.  Pt has not had any recent cardiac stress tests or cardiac surgeries since 2013. No other complaints noted.  Patient was here in January for similar symptoms but chest pain resolved after palpitations resolved. States the day chest pain has been more persistent.  PCP: [name], MD  Cardiologist: Dr. Zoila Shutter  ROS: See HPI Constitutional: no fever  Eyes: no drainage  ENT: no runny nose   Cardiovascular:  + chest pain  Resp: + SOB  GI: no vomiting GU: no dysuria Integumentary: no rash  Allergy: no hives  Musculoskeletal: no leg swelling  Neurological: no slurred speech ROS otherwise negative  PAST MEDICAL HISTORY/PAST SURGICAL HISTORY:  Past Medical History:  Diagnosis Date  . A-fib (HCC)   . Arthritis   . COPD (chronic obstructive pulmonary disease) (HCC)    mild on singulair  . Coronary artery disease 1998; 1996; 2013   PTCA LCX; CABG X 4; RE-do CABG  . GERD (gastroesophageal reflux disease)   . Hernia    umbilical  . Hx of myocardial  infarction 1983   medically treated   . Hyperlipidemia   . Hypertension   . OSA on CPAP     MEDICATIONS:  Prior to Admission medications   Medication Sig Start Date End Date Taking? Authorizing Provider  amiodarone (PACERONE) 200 MG tablet take 1 tablet by mouth once daily 08/17/15   Chrystie Nose, MD  aspirin EC 81 MG tablet Take 81 mg by mouth daily.    Historical Provider, MD  atorvastatin (LIPITOR) 10 MG tablet TAKE 1 TABLET BY MOUTH ONCE DAILY AT 6:00PM 12/20/15   Chrystie Nose, MD  Bioflavonoid Products (ESTER C PO) Take 500 mg by mouth daily.      Historical Provider, MD  cetirizine (ZYRTEC) 10 MG tablet Take 10 mg by mouth daily.      Historical Provider, MD  Cholecalciferol (VITAMIN D-3 PO) Take 1,000 Units by mouth 2 (two) times daily.     Historical Provider, MD  ELIQUIS 5 MG TABS tablet take 1 tablet by mouth twice a day 02/21/16   Chrystie Nose, MD  ezetimibe (ZETIA) 10 MG tablet Take 1 tablet (10 mg total) by mouth daily. 08/09/15   Chrystie Nose, MD  fluticasone (FLONASE) 50 MCG/ACT nasal spray Place 1 spray into both nostrils every morning.  03/02/14   Historical Provider, MD  folic acid (FOLVITE) 1 MG tablet take 1 tablet by mouth once daily 10/20/15   Chrystie Nose, MD  metoprolol tartrate (LOPRESSOR) 25 MG tablet  Take 0.5 tablets (12.5 mg total) by mouth 2 (two) times daily. 09/15/15   Chrystie Nose, MD  Misc Natural Products (OSTEO BI-FLEX ADV TRIPLE ST PO) Take 1 tablet by mouth daily.     Historical Provider, MD  montelukast (SINGULAIR) 10 MG tablet Take 10 mg by mouth at bedtime.     Historical Provider, MD  Multiple Vitamin (MULTIVITAMIN WITH MINERALS) TABS Take 1 tablet by mouth daily.    Historical Provider, MD  niacin (NIASPAN) 1000 MG CR tablet take 1 tablet by mouth at bedtime 10/20/15   Chrystie Nose, MD  NITROSTAT 0.4 MG SL tablet PLACE 1 TABLET UNDER THE TONGUE EVERY 5 MINUTES  AS NEEDED FOR CHEST PAIN( MAX 3 DOSES, 5 MINUTES APART) 09/02/14   Chrystie Nose, MD  Omega-3 Fatty Acids (FISH OIL PO) Take 1,000 mg by mouth 2 (two) times daily.      Historical Provider, MD  omeprazole (PRILOSEC) 20 MG capsule Take 20 mg by mouth daily.     Historical Provider, MD  POLY-IRON 150 150 MG capsule TAKE 1 CAPSULE BY MOUTH ONCE DAILY 09/15/15   Chrystie Nose, MD  valsartan-hydrochlorothiazide (DIOVAN-HCT) 160-12.5 MG tablet take 1 tablet once daily 10/20/15   Chrystie Nose, MD    ALLERGIES:  Allergies  Allergen Reactions  . Sulfa Antibiotics Swelling    SOCIAL HISTORY:  Social History  Substance Use Topics  . Smoking status: Former Smoker    Quit date: 03/27/1986  . Smokeless tobacco: Never Used  . Alcohol use No    FAMILY HISTORY: Family History  Problem Relation Age of Onset  . Stroke Mother   . Cancer Father     lung  . Cancer Sister     lung  . Cancer Sister     liver    EXAM: BP 137/63   Pulse 64   Temp 97.9 F (36.6 C) (Oral)   Resp (!) 27   SpO2 95%  CONSTITUTIONAL: Alert and oriented and responds appropriately to questions. Chronically ill-appearing, elderly HEAD: Normocephalic EYES: Conjunctivae clear, PERRL, EOMI ENT: normal nose; no rhinorrhea; moist mucous membranes NECK: Supple, no meningismus, no nuchal rigidity, no LAD  CARD: Regular and bradycardic; S1 and S2 appreciated; no murmurs, no clicks, no rubs, no gallops RESP: Normal chest excursion without splinting or tachypnea; breath sounds clear and equal bilaterally; no wheezes, no rhonchi, no rales, no hypoxia or respiratory distress, speaking full sentences ABD/GI: Normal bowel sounds; non-distended; soft, non-tender, no rebound, no guarding, no peritoneal signs, no hepatosplenomegaly BACK:  The back appears normal and is non-tender to palpation, there is no CVA tenderness EXT: Normal ROM in all joints; non-tender to palpation; no edema; normal capillary refill; no cyanosis, no calf tenderness or swelling    SKIN: Normal color for age and race; warm; no  rash NEURO: Moves all extremities equally, sensation to light touch intact diffusely, cranial nerves II through XII intact, normal speech PSYCH: The patient's mood and manner are appropriate. Grooming and personal hygiene are appropriate.  MEDICAL DECISION MAKING: Patient here with episode of palpitations and felt like his "heart was going real fast". Had chest discomfort at the time, shortness of breath and lightheadedness. All symptoms have resolved except for persistent chest pain. First troponin negative. Chest x-ray clear. Lites within normal limits. D-dimer negative. Given cardiac history without recent provocative testing, will admit for chest pain rule out.  We'll give aspirin and nitroglycerin.  ED PROGRESS:    12:20 AM  Discussed patient's case with hospitalist, Dr. Toniann Fail.  Recommend admission. Hospitalist will see the patient. Patient and family (if present) updated with plan.   I reviewed all nursing notes, vitals, pertinent old records, EKGs, labs, imaging (as available).    EKG Interpretation  Date/Time:  Monday March 06 2016 18:51:59 EST Ventricular Rate:  75 PR Interval:  166 QRS Duration: 128 QT Interval:  476 QTC Calculation: 531 R Axis:   -49 Text Interpretation:  Sinus rhythm with frequent Premature ventricular complexes and Premature atrial complexes Left axis deviation Non-specific intra-ventricular conduction block Cannot rule out Anteroseptal infarct , age undetermined Abnormal ECG No significant change since last tracing other than PVCs and PACs Confirmed by WARD,  DO, KRISTEN [phone]) on 03/06/2016 11:05:37 PM      I personally performed the services described in this documentation, which was scribed in my presence. The recorded information has been reviewed and is accurate.     Kristen N Ward, DO 03/07/16 0030   1:00 AM  Pt Had no relief in chest pain after 2 nitroglycerin tablets. Will give small dose of IV morphine. Patient has been admitted to  step down. Still hemodynamically stable.    Layla Maw Ward, DO 03/07/16 [phone]

## 2016-03-06 NOTE — ED Notes (Addendum)
Pt states he had a period of palpitations, he felt his heart racing. During this period he felt light-headed. Pt reports central chest pain starting after episode .

## 2016-03-07 ENCOUNTER — Observation Stay (HOSPITAL_COMMUNITY): Payer: Medicare Other

## 2016-03-07 ENCOUNTER — Observation Stay (HOSPITAL_BASED_OUTPATIENT_CLINIC_OR_DEPARTMENT_OTHER): Payer: Medicare Other

## 2016-03-07 ENCOUNTER — Other Ambulatory Visit: Payer: Self-pay | Admitting: Interventional Cardiology

## 2016-03-07 ENCOUNTER — Encounter (HOSPITAL_COMMUNITY): Payer: Self-pay | Admitting: Internal Medicine

## 2016-03-07 DIAGNOSIS — I2510 Atherosclerotic heart disease of native coronary artery without angina pectoris: Secondary | ICD-10-CM

## 2016-03-07 DIAGNOSIS — I255 Ischemic cardiomyopathy: Secondary | ICD-10-CM

## 2016-03-07 DIAGNOSIS — R001 Bradycardia, unspecified: Secondary | ICD-10-CM

## 2016-03-07 DIAGNOSIS — I480 Paroxysmal atrial fibrillation: Secondary | ICD-10-CM | POA: Diagnosis not present

## 2016-03-07 DIAGNOSIS — R072 Precordial pain: Secondary | ICD-10-CM

## 2016-03-07 DIAGNOSIS — R079 Chest pain, unspecified: Secondary | ICD-10-CM

## 2016-03-07 LAB — TROPONIN I
Troponin I: <0.03 ng/mL (ref <0.03)
Troponin I: <0.03 ng/mL (ref <0.03)

## 2016-03-07 LAB — HEPARIN LEVEL (UNFRACTIONATED): Heparin Unfractionated: 2.06 IU/mL — ABNORMAL HIGH (ref 0.30–0.70)

## 2016-03-07 LAB — APTT
APTT: 151 s — AB (ref 24–36)
aPTT: 33 seconds (ref 24–36)

## 2016-03-07 LAB — ECHOCARDIOGRAM COMPLETE
Height: 74.5 in
WEIGHTICAEL: 4398.62 [oz_av]

## 2016-03-07 LAB — MRSA PCR SCREENING: MRSA by PCR: NEGATIVE

## 2016-03-07 MED ORDER — EZETIMIBE 10 MG PO TABS
10.0000 mg | ORAL_TABLET | Freq: Every day | ORAL | Status: DC
  Administered 2016-03-07: 10 mg via ORAL
  Filled 2016-03-07: qty 1

## 2016-03-07 MED ORDER — NITROGLYCERIN 0.4 MG SL SUBL: 0.4000 mg | SUBLINGUAL_TABLET | SUBLINGUAL | Status: DC | PRN

## 2016-03-07 MED ORDER — ONDANSETRON HCL 4 MG/2ML IJ SOLN: 4.0000 mg | Freq: Four times a day (QID) | INTRAMUSCULAR | Status: DC | PRN

## 2016-03-07 MED ORDER — VALSARTAN-HYDROCHLOROTHIAZIDE 160-12.5 MG PO TABS: 1.0000 | ORAL_TABLET | Freq: Every day | ORAL | Status: DC

## 2016-03-07 MED ORDER — MORPHINE SULFATE (PF) 4 MG/ML IV SOLN: 2.0000 mg | INTRAVENOUS | Status: DC | PRN

## 2016-03-07 MED ORDER — MORPHINE SULFATE (PF) 4 MG/ML IV SOLN
2.0000 mg | Freq: Once | INTRAVENOUS | Status: AC
  Administered 2016-03-07: 2 mg via INTRAVENOUS
  Filled 2016-03-07: qty 1

## 2016-03-07 MED ORDER — PERFLUTREN LIPID MICROSPHERE
INTRAVENOUS | Status: AC
  Filled 2016-03-07: qty 10

## 2016-03-07 MED ORDER — ADULT MULTIVITAMIN W/MINERALS CH
1.0000 | ORAL_TABLET | Freq: Every day | ORAL | Status: DC
  Administered 2016-03-07: 1 via ORAL
  Filled 2016-03-07: qty 1

## 2016-03-07 MED ORDER — PANTOPRAZOLE SODIUM 40 MG PO TBEC
40.0000 mg | DELAYED_RELEASE_TABLET | Freq: Every day | ORAL | Status: DC
  Administered 2016-03-07: 40 mg via ORAL
  Filled 2016-03-07: qty 1

## 2016-03-07 MED ORDER — FLUTICASONE PROPIONATE 50 MCG/ACT NA SUSP
1.0000 | Freq: Every morning | NASAL | Status: DC
  Administered 2016-03-07: 1 via NASAL
  Filled 2016-03-07: qty 16

## 2016-03-07 MED ORDER — AMIODARONE HCL 200 MG PO TABS
200.0000 mg | ORAL_TABLET | Freq: Every day | ORAL | Status: DC
  Administered 2016-03-07: 200 mg via ORAL
  Filled 2016-03-07: qty 1

## 2016-03-07 MED ORDER — ATORVASTATIN CALCIUM 10 MG PO TABS: 10.0000 mg | ORAL_TABLET | Freq: Every day | ORAL | Status: DC

## 2016-03-07 MED ORDER — IRBESARTAN 150 MG PO TABS
150.0000 mg | ORAL_TABLET | Freq: Every day | ORAL | Status: DC
  Administered 2016-03-07: 150 mg via ORAL
  Filled 2016-03-07: qty 1

## 2016-03-07 MED ORDER — FOLIC ACID 1 MG PO TABS
1.0000 mg | ORAL_TABLET | Freq: Every day | ORAL | Status: DC
  Administered 2016-03-07: 1 mg via ORAL
  Filled 2016-03-07: qty 1

## 2016-03-07 MED ORDER — APIXABAN 5 MG PO TABS: 5.0000 mg | ORAL_TABLET | Freq: Once | ORAL | Status: DC

## 2016-03-07 MED ORDER — IOPAMIDOL (ISOVUE-370) INJECTION 76%
INTRAVENOUS | Status: AC
  Administered 2016-03-07: 100 mL
  Filled 2016-03-07: qty 100

## 2016-03-07 MED ORDER — NIACIN ER (ANTIHYPERLIPIDEMIC) 500 MG PO TBCR
1000.0000 mg | EXTENDED_RELEASE_TABLET | Freq: Every day | ORAL | Status: DC
  Filled 2016-03-07 (×2): qty 2

## 2016-03-07 MED ORDER — ASPIRIN EC 81 MG PO TBEC
81.0000 mg | DELAYED_RELEASE_TABLET | Freq: Every day | ORAL | Status: DC
Start: 2016-03-07 — End: 2016-03-07
  Administered 2016-03-07: 81 mg via ORAL
  Filled 2016-03-07: qty 1

## 2016-03-07 MED ORDER — PERFLUTREN LIPID MICROSPHERE
1.0000 mL | INTRAVENOUS | Status: AC | PRN
  Administered 2016-03-07: 2 mL via INTRAVENOUS
  Filled 2016-03-07: qty 10

## 2016-03-07 MED ORDER — ACETAMINOPHEN 325 MG PO TABS: 650.0000 mg | ORAL_TABLET | ORAL | Status: DC | PRN

## 2016-03-07 MED ORDER — OMEGA-3-ACID ETHYL ESTERS 1 G PO CAPS
1.0000 g | ORAL_CAPSULE | Freq: Every day | ORAL | Status: DC
  Administered 2016-03-07: 1 g via ORAL
  Filled 2016-03-07: qty 1

## 2016-03-07 MED ORDER — HYDROCHLOROTHIAZIDE 12.5 MG PO CAPS
12.5000 mg | ORAL_CAPSULE | Freq: Every day | ORAL | Status: DC
  Administered 2016-03-07: 12.5 mg via ORAL
  Filled 2016-03-07: qty 1

## 2016-03-07 MED ORDER — HEPARIN (PORCINE) IN NACL 100-0.45 UNIT/ML-% IJ SOLN
1600.0000 [IU]/h | INTRAMUSCULAR | Status: DC
  Administered 2016-03-07: 1600 [IU]/h via INTRAVENOUS
  Filled 2016-03-07: qty 250

## 2016-03-07 MED ORDER — METOPROLOL TARTRATE 12.5 MG HALF TABLET: 12.5000 mg | ORAL_TABLET | Freq: Two times a day (BID) | ORAL | Status: DC

## 2016-03-07 MED ORDER — MONTELUKAST SODIUM 10 MG PO TABS: 10.0000 mg | ORAL_TABLET | Freq: Every day | ORAL | Status: DC

## 2016-03-07 MED ORDER — LORATADINE 10 MG PO TABS
10.0000 mg | ORAL_TABLET | Freq: Every day | ORAL | Status: DC
  Administered 2016-03-07: 10 mg via ORAL
  Filled 2016-03-07: qty 1

## 2016-03-07 NOTE — Progress Notes (Signed)
Pt needs a 30 day Holtor monitor.

## 2016-03-07 NOTE — Progress Notes (Signed)
Pt education complete at this time with pt & his wife at bedside.  Pt aware of further instructions on obtaining holter monitor.  No further questions, comments or concerns.  Emotional support given.  Pt wheeled out to wife's car.

## 2016-03-07 NOTE — Progress Notes (Signed)
ANTICOAGULATION CONSULT NOTE - Initial Consult  Pharmacy Consult for heparin Indication: atrial fibrillation  Allergies  Allergen Reactions  . Sulfa Antibiotics Swelling    Patient Measurements: Height: 6' 2.5" (189.2 cm) Weight: 274 lb 14.6 oz (124.7 kg) IBW/kg (Calculated) : 83.35 Heparin Dosing Weight: 110kg  Vital Signs: Temp: 97.9 F (36.6 C) (02/12 1856) Temp Source: Oral (02/12 1856) BP: 116/49 (02/13 0115) Pulse Rate: 44 (02/13 0115)  Labs:  Recent Labs  03/06/16 1900  HGB 15.6  HCT 45.8  PLT 161  CREATININE 1.08    Estimated Creatinine Clearance: 86.1 mL/min (by C-G formula based on SCr of 1.08 mg/dL).   Medical History: Past Medical History:  Diagnosis Date  . A-fib (HCC)   . Arthritis   . COPD (chronic obstructive pulmonary disease) (HCC)    mild on singulair  . Coronary artery disease 1998; 1996; 2013   PTCA LCX; CABG X 4; RE-do CABG  . GERD (gastroesophageal reflux disease)   . Hernia    umbilical  . Hx of myocardial infarction 1983   medically treated   . Hyperlipidemia   . Hypertension   . OSA on CPAP     Medications:  Prescriptions Prior to Admission  Medication Sig Dispense Refill Last Dose  . amiodarone (PACERONE) 200 MG tablet take 1 tablet by mouth once daily 30 tablet 6 03/06/2016 at Unknown time  . aspirin EC 81 MG tablet Take 81 mg by mouth daily.   03/06/2016 at Unknown time  . atorvastatin (LIPITOR) 10 MG tablet TAKE 1 TABLET BY MOUTH ONCE DAILY AT 6:00PM 30 tablet 3 03/06/2016 at Unknown time  . Bioflavonoid Products (ESTER C PO) Take 500 mg by mouth daily.     03/06/2016 at Unknown time  . cetirizine (ZYRTEC) 10 MG tablet Take 10 mg by mouth daily.     03/06/2016 at Unknown time  . Cholecalciferol (VITAMIN D-3 PO) Take 1,000 Units by mouth 2 (two) times daily.    03/06/2016 at Unknown time  . ELIQUIS 5 MG TABS tablet take 1 tablet by mouth twice a day 180 tablet 1 03/06/2016 at 1800  . ezetimibe (ZETIA) 10 MG tablet Take 1 tablet  (10 mg total) by mouth daily. 30 tablet 7 03/06/2016 at Unknown time  . fluticasone (FLONASE) 50 MCG/ACT nasal spray Place 1 spray into both nostrils every morning.   1 03/06/2016 at Unknown time  . folic acid (FOLVITE) 1 MG tablet take 1 tablet by mouth once daily 30 tablet 10 03/06/2016 at Unknown time  . metoprolol tartrate (LOPRESSOR) 25 MG tablet Take 0.5 tablets (12.5 mg total) by mouth 2 (two) times daily. 30 tablet 11 03/06/2016 at 2230  . Misc Natural Products (OSTEO BI-FLEX ADV TRIPLE ST PO) Take 1 tablet by mouth daily.    03/06/2016 at Unknown time  . montelukast (SINGULAIR) 10 MG tablet Take 10 mg by mouth at bedtime.    03/06/2016 at Unknown time  . Multiple Vitamin (MULTIVITAMIN WITH MINERALS) TABS Take 1 tablet by mouth daily.   03/06/2016 at Unknown time  . niacin (NIASPAN) 1000 MG CR tablet take 1 tablet by mouth at bedtime 30 tablet 10 03/06/2016 at Unknown time  . NITROSTAT 0.4 MG SL tablet PLACE 1 TABLET UNDER THE TONGUE EVERY 5 MINUTES  AS NEEDED FOR CHEST PAIN( MAX 3 DOSES, 5 MINUTES APART) 25 tablet 2 unknown  . Omega-3 Fatty Acids (FISH OIL PO) Take 1,000 mg by mouth 2 (two) times daily.     03/06/2016  at Unknown time  . omeprazole (PRILOSEC) 20 MG capsule Take 20 mg by mouth daily.    03/06/2016 at Unknown time  . POLY-IRON 150 150 MG capsule TAKE 1 CAPSULE BY MOUTH ONCE DAILY 30 capsule 11 03/06/2016 at Unknown time  . valsartan-hydrochlorothiazide (DIOVAN-HCT) 160-12.5 MG tablet take 1 tablet once daily 30 tablet 10 03/06/2016 at Unknown time   Scheduled:  . amiodarone  200 mg Oral Daily  . aspirin EC  81 mg Oral Daily  . atorvastatin  10 mg Oral q1800  . ezetimibe  10 mg Oral Daily  . fluticasone  1 spray Each Nare q morning - 10a  . folic acid  1 mg Oral Daily  . irbesartan  150 mg Oral Daily   And  . hydrochlorothiazide  12.5 mg Oral Daily  . loratadine  10 mg Oral Daily  . metoprolol tartrate  12.5 mg Oral BID  . montelukast  10 mg Oral QHS  . multivitamin with minerals   1 tablet Oral Daily  . niacin  1,000 mg Oral QHS  . omega-3 acid ethyl esters  1 g Oral Daily  . pantoprazole  40 mg Oral Daily    Assessment: 71yo male c/o palpitations followed by CP, no relief s/p NTG in ED, to transition from Eliquis for Afib to heparin; given Eliquis PTA (last dose 2/12 at 1800) will need to monitor/adjust heparin by PTT.  Goal of Therapy:  Heparin level 0.3-0.7 units/ml aPTT 66-102 seconds Monitor platelets by anticoagulation protocol: Yes   Plan:  Will begin heparin gtt at 0600 at 1600 units/hr and monitor heparin levels, aPTT, and CBC.  Vernard Gambles, PharmD, BCPS  03/07/2016,2:36 AM

## 2016-03-07 NOTE — Care Management Obs Status (Signed)
MEDICARE OBSERVATION STATUS NOTIFICATION   Patient Details  Name: Nicholas Lynn MRN: [id number] Date of Birth: 09-11-1945   Medicare Observation Status Notification Given:  Yes    Hanley Hays, RN 03/07/2016, 10:00 AM

## 2016-03-07 NOTE — Consult Note (Signed)
Cardiology Consult    Patient ID: Nicholas Lynn MRN: [id number], DOB/AGE: 1945/08/29   Admit date: 03/06/2016 Date of Consult: 03/07/2016  Primary Physician: [name], MD Primary Cardiologist: Dr. Rennis Golden Requesting Provider: Dr. Elvera Lennox  Reason for Consult: Chest pain  Patient Profile    Nicholas Lynn is a 71 yo male with a PMH significant for CAD s/p angioplasty (1988), CABG (1996), and redo CABG (2013); HTN, ischemic cardiomyopathy, paroxysmal Afib with RVR, and bradycardia.  Cardiology was consulted for chest pain on admission.  Past Medical History   Past Medical History:  Diagnosis Date  . A-fib (HCC)   . Arthritis   . COPD (chronic obstructive pulmonary disease) (HCC)    mild on singulair  . Coronary artery disease 1998; 1996; 2013   PTCA LCX; CABG X 4; RE-do CABG  . GERD (gastroesophageal reflux disease)   . Hernia    umbilical  . Hx of myocardial infarction 1983   medically treated   . Hyperlipidemia   . Hypertension   . OSA on CPAP     Past Surgical History:  Procedure Laterality Date  . ANGIOPLASTY  1993   LCX  . CARDIAC CATHETERIZATION  2008;2013  . CARDIOVERSION N/A 12/25/2012   Procedure: CARDIOVERSION;  Surgeon: Chrystie Nose, MD;  Location: [facility] ENDOSCOPY;  Service: Cardiovascular;  Laterality: N/A;  . CATARACT EXTRACTION    . CORONARY ARTERY BYPASS GRAFT  1996   3 VESSELS  . CORONARY ARTERY BYPASS GRAFT  09/11/2011   Procedure: REDO CORONARY ARTERY BYPASS GRAFTING (CABG);  Surgeon: Delight Ovens, MD;  Location: [facility] OR;  Service: Open Heart Surgery;  Laterality: N/A;  Redo Coronary Artery Bypass Graft times three utilizing the right internal mammary artery and the left  and right greater saphenous veins.  Marland Kitchen GRAFT(S) ANGIOGRAM  09/01/2011   Procedure: GRAFT(S) Rosalin Hawking;  Surgeon: Marykay Lex, MD;  Location: [facility] CATH LAB;  Service: Cardiovascular;;  . LEFT HEART CATHETERIZATION WITH CORONARY ANGIOGRAM N/A 09/01/2011   Procedure: LEFT HEART  CATHETERIZATION WITH CORONARY ANGIOGRAM;  Surgeon: Marykay Lex, MD;  Location: [facility] CATH LAB;  Service: Cardiovascular;  Laterality: N/A;  . TEE WITHOUT CARDIOVERSION  09/11/2011   Procedure: TRANSESOPHAGEAL ECHOCARDIOGRAM (TEE);  Surgeon: Delight Ovens, MD;  Location: [facility] OR;  Service: Open Heart Surgery;  Laterality: N/A;  . TEE WITHOUT CARDIOVERSION N/A 12/25/2012   Procedure: TRANSESOPHAGEAL ECHOCARDIOGRAM (TEE);  Surgeon: Chrystie Nose, MD;  Location: [facility] ENDOSCOPY;  Service: Cardiovascular;  Laterality: N/A;  . UMBILICAL HERNIA REPAIR  04/04/2011   Procedure: HERNIA REPAIR UMBILICAL ADULT;  Surgeon: Adolph Pollack, MD;  Location: [facility] ORS;  Service: General;  Laterality: N/A;  Umbilical Hernia Repair     Allergies  Allergies  Allergen Reactions  . Sulfa Antibiotics Swelling    History of Present Illness    Nicholas Lynn is s/p redo CABG (2013). He has had a history of Afib RVR, treated with TEE/DCCV (12/25/12), amiodarone and coumadin. He has since been seen several times in clinic with bradycardia resulting in  medication adjustments (decreased amiodarone and beta blocker dose).  He is currently taking ASA, amiodarone, lopressor, eliquis, and diovan. He was recently seen in the ED (02/21/16) after a bout of palpitations, likely Afib, that lasted approximately 1 minute and self-resolved. He reported associated chest pressure at that time. No interventions or medication changes implemented at that visit and he was discharged home.  He was seen in follow up with our clinic on 03/01/16 without  further episodes of chest pain or palpitations, even while exercising on the treadmill. No medication changes and no further workup needed at that time. Yesterday, he presented to ED after a bout of palpitations (likely paroxysmal Afib) and central/right-sided chest tightness. At baseline, he is bradycardic in the 40-50s. On my interview, he reports feeling "sore" in a linear fashion extending from his  sternum to the right side under is pectoralis muscle. It is worse when he takes a deep breath or coughs. No relieving factors. The pain is not positional and is tender to palpation. He denies any recent injuries or lifting heavy objects. He denies sick contacts. He denies fever, chills, lightheadedness, dizziness, nausea, vomiting, problems urinating, and diarrhea. 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 His echocardiogram today showed similar systolic dysfunction compared to last echo (10/2015).   Inpatient Medications    . amiodarone  200 mg Oral Daily  . aspirin EC  81 mg Oral Daily  . atorvastatin  10 mg Oral q1800  . ezetimibe  10 mg Oral Daily  . fluticasone  1 spray Each Nare q morning - 10a  . folic acid  1 mg Oral Daily  . irbesartan  150 mg Oral Daily   And  . hydrochlorothiazide  12.5 mg Oral Daily  . loratadine  10 mg Oral Daily  . metoprolol tartrate  12.5 mg Oral BID  . montelukast  10 mg Oral QHS  . multivitamin with minerals  1 tablet Oral Daily  . niacin  1,000 mg Oral QHS  . omega-3 acid ethyl esters  1 g Oral Daily  . pantoprazole  40 mg Oral Daily     Outpatient Medications    Prior to Admission medications   Medication Sig Start Date End Date Taking? Authorizing Provider  amiodarone (PACERONE) 200 MG tablet take 1 tablet by mouth once daily 08/17/15  Yes Chrystie Nose, MD  aspirin EC 81 MG tablet Take 81 mg by mouth daily.   Yes Historical Provider, MD  atorvastatin (LIPITOR) 10 MG tablet TAKE 1 TABLET BY MOUTH ONCE DAILY AT 6:00PM 12/20/15  Yes Chrystie Nose, MD  Bioflavonoid Products (ESTER C PO) Take 500 mg by mouth daily.     Yes Historical Provider, MD  cetirizine (ZYRTEC) 10 MG tablet Take 10 mg by mouth daily.     Yes Historical Provider, MD  Cholecalciferol (VITAMIN D-3 PO) Take 1,000 Units by mouth 2 (two) times daily.    Yes Historical Provider, MD  ELIQUIS 5 MG TABS tablet take 1 tablet by mouth twice a day 02/21/16  Yes  Chrystie Nose, MD  ezetimibe (ZETIA) 10 MG tablet Take 1 tablet (10 mg total) by mouth daily. 08/09/15  Yes Chrystie Nose, MD  fluticasone (FLONASE) 50 MCG/ACT nasal spray Place 1 spray into both nostrils every morning.  03/02/14  Yes Historical Provider, MD  folic acid (FOLVITE) 1 MG tablet take 1 tablet by mouth once daily 10/20/15  Yes Chrystie Nose, MD  metoprolol tartrate (LOPRESSOR) 25 MG tablet Take 0.5 tablets (12.5 mg total) by mouth 2 (two) times daily. 09/15/15  Yes Chrystie Nose, MD  Misc Natural Products (OSTEO BI-FLEX ADV TRIPLE ST PO) Take 1 tablet by mouth daily.    Yes Historical Provider, MD  montelukast (SINGULAIR) 10 MG tablet Take 10 mg by mouth at bedtime.    Yes Historical Provider, MD  Multiple Vitamin (MULTIVITAMIN WITH MINERALS) TABS Take 1 tablet by mouth  daily.   Yes Historical Provider, MD  niacin (NIASPAN) 1000 MG CR tablet take 1 tablet by mouth at bedtime 10/20/15  Yes Chrystie Nose, MD  NITROSTAT 0.4 MG SL tablet PLACE 1 TABLET UNDER THE TONGUE EVERY 5 MINUTES  AS NEEDED FOR CHEST PAIN( MAX 3 DOSES, 5 MINUTES APART) 09/02/14  Yes Chrystie Nose, MD  Omega-3 Fatty Acids (FISH OIL PO) Take 1,000 mg by mouth 2 (two) times daily.     Yes Historical Provider, MD  omeprazole (PRILOSEC) 20 MG capsule Take 20 mg by mouth daily.    Yes Historical Provider, MD  POLY-IRON 150 150 MG capsule TAKE 1 CAPSULE BY MOUTH ONCE DAILY 09/15/15  Yes Chrystie Nose, MD  valsartan-hydrochlorothiazide (DIOVAN-HCT) 160-12.5 MG tablet take 1 tablet once daily 10/20/15  Yes Chrystie Nose, MD     Family History    Family History  Problem Relation Age of Onset  . Stroke Mother   . Cancer Father     lung  . Cancer Sister     lung  . Cancer Sister     liver    Social History    Social History   Social History  . Marital status: Married    Spouse name: N/A  . Number of children: N/A  . Years of education: N/A   Occupational History  . Not on file.   Social History Main  Topics  . Smoking status: Former Smoker    Quit date: 03/27/1986  . Smokeless tobacco: Never Used  . Alcohol use No  . Drug use: No  . Sexual activity: Not on file   Other Topics Concern  . Not on file   Social History Narrative   Retired Psychologist, occupational     Review of Systems    General:  No chills, fever, night sweats or weight changes.  Cardiovascular:  No dyspnea on exertion, edema, orthopnea, palpitations, paroxysmal nocturnal dyspnea. Positive for chest soreness that is tender to palpation. Dermatological: No rash, lesions/masses Respiratory: No cough, dyspnea Urologic: No hematuria, dysuria Abdominal:   No nausea, vomiting, diarrhea, bright red blood per rectum, melena, or hematemesis Neurologic:  No visual changes, changes in mental status. All other systems reviewed and are otherwise negative except as noted above.  Physical Exam    Blood pressure (!) 119/56, pulse (!) 44, temperature 98.2 F (36.8 C), temperature source Oral, resp. rate 18, height 6' 2.5" (1.892 m), weight 274 lb 14.6 oz (124.7 kg), SpO2 96 %.  General: Pleasant, NAD Psych: Normal affect. Neuro: Alert and oriented X 3. Moves all extremities spontaneously. HEENT: Normal  Neck: Supple without bruits or JVD. Lungs:  Resp regular and unlabored, CTA. Heart: RRR no s3, s4, or murmurs. CABG scar present Abdomen: Soft, non-tender, non-distended, BS + x 4.  Extremities: No clubbing, cyanosis or edema. DP/PT/Radials 2+ and equal bilaterally.  Labs    Troponin [facility] Test)  Recent Labs  03/06/16 1910  TROPIPOC 0.00    Recent Labs  03/07/16 0239 03/07/16 0738  TROPONINI <0.03 <0.03   Lab Results  Component Value Date   WBC 11.1 (H) 03/06/2016   HGB 15.6 03/06/2016   HCT 45.8 03/06/2016   MCV 90.5 03/06/2016   PLT 161 03/06/2016    Recent Labs Lab 03/06/16 1900  NA 137  K 4.1  CL 103  CO2 21*  BUN 14  CREATININE 1.08  CALCIUM 9.5  GLUCOSE 160*   No results found for: CHOL, HDL,  LDLCALC,  TRIG Lab Results  Component Value Date   DDIMER 0.30 03/06/2016     Radiology Studies    Dg Chest 2 View  Result Date: 03/06/2016 CLINICAL DATA:  Acute onset of palpitations.  Initial encounter. EXAM: CHEST  2 VIEW COMPARISON:  Chest radiograph performed 02/21/2016 FINDINGS: The lungs are well-aerated and clear. There is no evidence of focal opacification, pleural effusion or pneumothorax. The heart is normal in size; the patient is status post median sternotomy, with evidence of prior CABG. No acute osseous abnormalities are seen. Anterior bridging osteophytes are noted along the thoracic spine. IMPRESSION: No acute cardiopulmonary process seen. Electronically Signed   By: Roanna Raider M.D.   On: 03/06/2016 19:27   Dg Chest 2 View  Result Date: 02/21/2016 CLINICAL DATA:  71 year old hypertensive male with palpitations. Left chest pain. Initial encounter. Initial encounter. EXAM: CHEST  2 VIEW COMPARISON:  08/18/2013 FINDINGS: Post CABG.  Heart size within normal limits. Calcified mildly tortuous aorta. Central pulmonary vascular prominence and minimal chronic peribronchial thickening stable. No infiltrate, congestive heart failure or pneumothorax. No plain film evidence of pulmonary malignancy. No acute osseous abnormality. IMPRESSION: No acute pulmonary abnormality. Tortuous aorta. Electronically Signed   By: Lacy Duverney M.D.   On: 02/21/2016 12:52   Ct Angio Chest Pe W Or Wo Contrast  Result Date: 03/07/2016 CLINICAL DATA:  Chest pain. History of heart disease, hypertension, COPD, atrial fibrillation. EXAM: CT ANGIOGRAPHY CHEST WITH CONTRAST TECHNIQUE: Multidetector CT imaging of the chest was performed using the standard protocol during bolus administration of intravenous contrast. Multiplanar CT image reconstructions and MIPs were obtained to evaluate the vascular anatomy. CONTRAST:  100 mL Isovue 370 COMPARISON:  None. FINDINGS: Cardiovascular: Good opacification of the central  and segmental pulmonary arteries. No focal filling defects demonstrated. No evidence of significant pulmonary embolus. Cardiac enlargement with particular prominence of the left ventricle. 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 Calcification of the aorta without dilatation. Mediastinum/Nodes: Esophagus is decompressed. No significant lymphadenopathy in the chest. Postoperative changes consistent with coronary artery bypass grafts. Lungs/Pleura: Atelectasis in the lung bases. Focal area of infiltration in the superior segment of the right lower lung may represent a small area of pneumonia. Mild mosaic attenuation pattern to the lungs could be due to motion artifact, edema, or air trapping. Airways are patent. No pleural effusions. No pneumothorax. Upper Abdomen: No acute abnormality. Musculoskeletal: Degenerative changes in the spine. Median sternotomy. No destructive bone lesions. Review of the MIP images confirms the above findings. IMPRESSION: No evidence of significant pulmonary embolus. Cardiac enlargement with left ventricular aneurysm. Focal infiltration in the superior segment of the right lower lung may represent a small pneumonia. Mosaic attenuation to the lungs could represent motion artifact, edema, or air trapping. Low lung volumes. Electronically Signed   By: Burman Nieves M.D.   On: 03/07/2016 02:23    ECG & Cardiac Imaging    Echocardiogram 03/07/16: Study Conclusions - Left ventricle: Acoustical opacification showed no evidence of   thrombus. The cavity size was mildly dilated. Systolic function   was moderately reduced. The estimated ejection fraction was in   the range of 35% to 40%. There is akinesis of the   mid-apicalinferolateral, inferior, inferoseptal, and apical   myocardium. There is akinesis of the midanteroseptal myocardium.   There is akinesis of the apicalanterior myocardium. Features are   consistent with a  pseudonormal left ventricular filling pattern,   with concomitant abnormal relaxation and  increased filling   pressure (grade 2 diastolic dysfunction). Doppler parameters are   consistent with high ventricular filling pressure. - Aortic valve: Trileaflet; normal thickness, mildly calcified   leaflets. - Mitral valve: There was trivial regurgitation. - Left atrium: The atrium was mildly dilated. Anterior-posterior   dimension: 46 mm. - Pulmonic valve: There was trivial regurgitation.   EKG 03/06/16: NSR with PVCs and PACs, rate 75   Echocardiogram 10/27/15: LVEF 35-40%   Assessment & Plan    1. Chest pain / tightness - troponin x 2 negative - EKG without significant ST changes suggestive of ischemia, no atrial fibrillation or flutter - CTA negative for PE - CXR negative for infectious process or bony abnormalities  - His chest pain sounds pleuritic in nature, worse with deep breathing and coughing, is not positional, and does not radiate anywhere. He reports having this pain following heart palpitations in the past. This pain is different than the anginal pain he has had in the past. He had a recent ED visit (01/2016) for the same complaint. Baseline bradycardia prevents increasing the dose of lopressor and amiodarone. Given his symptoms, would recommend outpatient 30-day holtor monitor to evaluation sick sinus syndrome vs paroxysmal Afib.  - no further ischemic evaluation needed at this time. - pt ok to restart eliquis    2. Paroxysmal Atrial Fibrillation - continue amiodarone and lopressor for now - baseline bradycardia prevents increasing dose of lopressor - heparin gtt running; OK to restart eliquis from cardiology standpoint - K and Mg WNL - recommend checking TSH    3. Bradycardia - HR 40-80s; baseline in the 40-50s - pt asymptomatic    4. Combined systolic and diastolic heart failure - systolic function is stable compared to echo on 10/27/15; LVEF 35-40% - pt does not  appear volume overloaded on exam - no diuresis needed at this time - no lasix in home regimen    Signed, Duane Boston, PA-C 03/07/2016, 1:03 PM   I have examined the patient and reviewed assessment and plan and discussed with patient.  Agree with above as stated.  Atypical chest pain, nothing like his prior angina.  Plan for an event monitor as an outpatient.  He appears euvolemic but states that he feels well and wants to go home. F/u with Dr. Rennis Golden.  Lance Muss

## 2016-03-07 NOTE — Progress Notes (Signed)
  Echocardiogram 2D Echocardiogram with definity has been performed.  Janalyn Harder 03/07/2016, 11:16 AM

## 2016-03-07 NOTE — Discharge Summary (Signed)
Physician Discharge Summary  Nicholas Lynn ZOX:[id number] DOB: September 14, 1945 DOA: 03/06/2016  PCP: [name], MD  Admit date: 03/06/2016 Discharge date: 03/07/2016  Admitted From: home Disposition:  home  Recommendations for Outpatient Follow-up:  1. Follow up with PCP in 1-2 weeks 2. Loop recorder as an outpatient will be arranged by cardiology clinic  Home Health: none Equipment/Devices: none   Discharge Condition: stable CODE STATUS: Full code Diet recommendation: heart healthy  HPI: Per Dr. Gilford Silvius, Nicholas Lynn is a 71 y.o. male with PMH of CAD s/p CABG 1996 with redo CABG 08/2011, PAF on eliquis and amiodarone, HTN, HLD, and OSA on CPAP presents with complaints of palpitation and chest pain. Last evening at home patient had brief episode of palpitation which lasted for 10 minutes and resolved without any intervention. Subsequent to that patient started developing chest pressure retrosternal and soreness in the right side of the chest. By the time patient reached ER chest pain resolved. EKG shows nonspecific findings. Chest x-ray was unremarkable. Patient also was still complaining of pleuritic-type of chest pain for which CT angiogram of the chest was ordered. Denies any nausea vomiting diaphoresis.   Hospital Course: Discharge Diagnoses:  Principal Problem:   Chest pain Active Problems:   CAD, CABG '96, re do CABG 09/11/11 X 3   Sleep apnea, on C-pap    PAF recurrent- s/p recent DCCV on Amiodarone 12/26/12   Ischemic cardiomyopathy, "moderate LVD" at cath 09/01/11   Bradycardia  Chest pain - patient was admitted to the hospital with short episode of chest pain in the setting of palpitations. On presentation here he was chest pain free. On presentation he was in sinus rhythm and bradycardic which is at baseline for him. Cardiology was consulted and have followed patient while hospitalized. They recommended outpatient 30 day monitor which will be arranged as an outpatient.  His cardiac enzymes remained negative and he was chest pain free. In addition, he underwent a CT angio of the chest which was negative for PE. PAF - Currently in sinus bradycardia, which is chronic for him. TSH normal at 1.620 at [facility] last week  Hypertension - Continue home medications OSA - CPAP Chronic combined systolic and diastolic CHF - 2-D echo as below, similar to prior echos, patient appears euvolemic   Discharge Instructions   Allergies as of 03/07/2016      Reactions   Sulfa Antibiotics Swelling      Medication List    TAKE these medications   amiodarone 200 MG tablet Commonly known as:  PACERONE take 1 tablet by mouth once daily   aspirin EC 81 MG tablet Take 81 mg by mouth daily.   atorvastatin 10 MG tablet Commonly known as:  LIPITOR TAKE 1 TABLET BY MOUTH ONCE DAILY AT 6:00PM   cetirizine 10 MG tablet Commonly known as:  ZYRTEC Take 10 mg by mouth daily.   ELIQUIS 5 MG Tabs tablet Generic drug:  apixaban take 1 tablet by mouth twice a day   ESTER C PO Take 500 mg by mouth daily.   ezetimibe 10 MG tablet Commonly known as:  ZETIA Take 1 tablet (10 mg total) by mouth daily.   FISH OIL PO Take 1,000 mg by mouth 2 (two) times daily.   fluticasone 50 MCG/ACT nasal spray Commonly known as:  FLONASE Place 1 spray into both nostrils every morning.   folic acid 1 MG tablet Commonly known as:  FOLVITE take 1 tablet by mouth once daily  metoprolol tartrate 25 MG tablet Commonly known as:  LOPRESSOR Take 0.5 tablets (12.5 mg total) by mouth 2 (two) times daily.   montelukast 10 MG tablet Commonly known as:  SINGULAIR Take 10 mg by mouth at bedtime.   multivitamin with minerals Tabs tablet Take 1 tablet by mouth daily.   niacin 1000 MG CR tablet Commonly known as:  NIASPAN take 1 tablet by mouth at bedtime   NITROSTAT 0.4 MG SL tablet Generic drug:  nitroGLYCERIN PLACE 1 TABLET UNDER THE TONGUE EVERY 5 MINUTES  AS NEEDED FOR CHEST  PAIN( MAX 3 DOSES, 5 MINUTES APART)   omeprazole 20 MG capsule Commonly known as:  PRILOSEC Take 20 mg by mouth daily.   OSTEO BI-FLEX ADV TRIPLE ST PO Take 1 tablet by mouth daily.   POLY-IRON 150 150 MG capsule Generic drug:  iron polysaccharides TAKE 1 CAPSULE BY MOUTH ONCE DAILY   valsartan-hydrochlorothiazide 160-12.5 MG tablet Commonly known as:  DIOVAN-HCT take 1 tablet once daily   VITAMIN D-3 PO Take 1,000 Units by mouth 2 (two) times daily.      Follow-up Information    Chrystie Nose, MD. Schedule an appointment as soon as possible for a visit in 1 week(s).   Specialty:  Cardiology Contact information: [address] [place] [address] [phone]          Allergies  Allergen Reactions  . Sulfa Antibiotics Swelling    Consultations:  Cardiology   Procedures/Studies: 2D echo Study Conclusions  - Left ventricle: Acoustical opacification showed no evidence of   thrombus. The cavity size was mildly dilated. Systolic function   was moderately reduced. The estimated ejection fraction was in   the range of 35% to 40%. There is akinesis of the   mid-apicalinferolateral, inferior, inferoseptal, and apical   myocardium. There is akinesis of the midanteroseptal myocardium.   There is akinesis of the apicalanterior myocardium. Features are   consistent with a pseudonormal left ventricular filling pattern,   with concomitant abnormal relaxation and increased filling   pressure (grade 2 diastolic dysfunction). Doppler parameters are   consistent with high ventricular filling pressure. - Aortic valve: Trileaflet; normal thickness, mildly calcified   leaflets. - Mitral valve: There was trivial regurgitation. - Left atrium: The atrium was mildly dilated. Anterior-posterior   dimension: 46 mm.  - Pulmonic valve: There was trivial regurgitation.  Dg Chest 2 View  Result Date: 03/06/2016 CLINICAL DATA:  Acute onset of palpitations.   Initial encounter. EXAM: CHEST  2 VIEW COMPARISON:  Chest radiograph performed 02/21/2016 FINDINGS: The lungs are well-aerated and clear. There is no evidence of focal opacification, pleural effusion or pneumothorax. The heart is normal in size; the patient is status post median sternotomy, with evidence of prior CABG. No acute osseous abnormalities are seen. Anterior bridging osteophytes are noted along the thoracic spine. IMPRESSION: No acute cardiopulmonary process seen. Electronically Signed   By: Roanna Raider M.D.   On: 03/06/2016 19:27   Dg Chest 2 View  Result Date: 02/21/2016 CLINICAL DATA:  71 year old hypertensive male with palpitations. Left chest pain. Initial encounter. Initial encounter. EXAM: CHEST  2 VIEW COMPARISON:  08/18/2013 FINDINGS: Post CABG.  Heart size within normal limits. Calcified mildly tortuous aorta. Central pulmonary vascular prominence and minimal chronic peribronchial thickening stable. No infiltrate, congestive heart failure or pneumothorax. No plain film evidence of pulmonary malignancy. No acute osseous abnormality. IMPRESSION: No acute pulmonary abnormality. Tortuous aorta. Electronically Signed   By: Lacy Duverney  M.D.   On: 02/21/2016 12:52   Ct Angio Chest Pe W Or Wo Contrast  Result Date: 03/07/2016 CLINICAL DATA:  Chest pain. History of heart disease, hypertension, COPD, atrial fibrillation. EXAM: CT ANGIOGRAPHY CHEST WITH CONTRAST TECHNIQUE: Multidetector CT imaging of the chest was performed using the standard protocol during bolus administration of intravenous contrast. Multiplanar CT image reconstructions and MIPs were obtained to evaluate the vascular anatomy. CONTRAST:  100 mL Isovue 370 COMPARISON:  None. FINDINGS: Cardiovascular: Good opacification of the central and segmental pulmonary arteries. No focal filling defects demonstrated. No evidence of significant pulmonary embolus. Cardiac enlargement with particular prominence of the left ventricle. 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 Calcification of the aorta without dilatation. Mediastinum/Nodes: Esophagus is decompressed. No significant lymphadenopathy in the chest. Postoperative changes consistent with coronary artery bypass grafts. Lungs/Pleura: Atelectasis in the lung bases. Focal area of infiltration in the superior segment of the right lower lung may represent a small area of pneumonia. Mild mosaic attenuation pattern to the lungs could be due to motion artifact, edema, or air trapping. Airways are patent. No pleural effusions. No pneumothorax. Upper Abdomen: No acute abnormality. Musculoskeletal: Degenerative changes in the spine. Median sternotomy. No destructive bone lesions. Review of the MIP images confirms the above findings. IMPRESSION: No evidence of significant pulmonary embolus. Cardiac enlargement with left ventricular aneurysm. Focal infiltration in the superior segment of the right lower lung may represent a small pneumonia. Mosaic attenuation to the lungs could represent motion artifact, edema, or air trapping. Low lung volumes. Electronically Signed   By: Burman Nieves M.D.   On: 03/07/2016 02:23      Subjective: - no chest pain, shortness of breath, no abdominal pain, nausea or vomiting.   Discharge Exam: Vitals:   03/07/16 1100 03/07/16 1500  BP: (!) 119/56 (!) 112/53  Pulse: (!) 44   Resp: 18 19  Temp: 98.2 F (36.8 C) 97.6 F (36.4 C)   Vitals:   03/07/16 0300 03/07/16 0700 03/07/16 1100 03/07/16 1500  BP: 124/65 120/63 (!) 119/56 (!) 112/53  Pulse: (!) 43 (!) 44 (!) 44   Resp: 15 14 18 19   Temp: 97.7 F (36.5 C) 97.8 F (36.6 C) 98.2 F (36.8 C) 97.6 F (36.4 C)  TempSrc: Oral Oral Oral Oral  SpO2: 95% 95% 96% 96%  Weight:      Height:        General: Pt is alert, awake, not in acute distress Cardiovascular: RRR, S1/S2 +, no rubs, no gallops Respiratory: CTA bilaterally, no wheezing, no  rhonchi Abdominal: Soft, NT, ND, bowel sounds + Extremities: no edema, no cyanosis    The results of significant diagnostics from this hospitalization (including imaging, microbiology, ancillary and laboratory) are listed below for reference.     Microbiology: Recent Results (from the past 240 hour(s))  MRSA PCR Screening     Status: None   Collection Time: 03/07/16  3:19 AM  Result Value Ref Range Status   MRSA by PCR NEGATIVE NEGATIVE Final    Comment:        The GeneXpert MRSA Assay (FDA approved for NASAL specimens only), is one component of a comprehensive MRSA colonization surveillance program. It is not intended to diagnose MRSA infection nor to guide or monitor treatment for MRSA infections.      Labs: BNP (last 3 results) No results for input(s): BNP in the last 8760 hours. Basic Metabolic Panel:  Recent Labs Lab  03/06/16 1900  NA 137  K 4.1  CL 103  CO2 21*  GLUCOSE 160*  BUN 14  CREATININE 1.08  CALCIUM 9.5  MG 1.9   Liver Function Tests: No results for input(s): AST, ALT, ALKPHOS, BILITOT, PROT, ALBUMIN in the last 168 hours. No results for input(s): LIPASE, AMYLASE in the last 168 hours. No results for input(s): AMMONIA in the last 168 hours. CBC:  Recent Labs Lab 03/06/16 1900  WBC 11.1*  HGB 15.6  HCT 45.8  MCV 90.5  PLT 161   Cardiac Enzymes:  Recent Labs Lab 03/07/16 0239 03/07/16 0738 03/07/16 1503  TROPONINI <0.03 <0.03 <0.03   BNP: Invalid input(s): POCBNP CBG: No results for input(s): GLUCAP in the last 168 hours. D-Dimer  Recent Labs  03/06/16 1900  DDIMER 0.30   Hgb A1c No results for input(s): HGBA1C in the last 72 hours. Lipid Profile No results for input(s): CHOL, HDL, LDLCALC, TRIG, CHOLHDL, LDLDIRECT in the last 72 hours. Thyroid function studies No results for input(s): TSH, T4TOTAL, T3FREE, THYROIDAB in the last 72 hours.  Invalid input(s): FREET3 Anemia work up No results for input(s): VITAMINB12,  FOLATE, FERRITIN, TIBC, IRON, RETICCTPCT in the last 72 hours. Urinalysis    Component Value Date/Time   COLORURINE YELLOW 09/08/2011 1203   APPEARANCEUR CLEAR 09/08/2011 1203   LABSPEC 1.020 08/18/2013 2037   PHURINE 6.0 08/18/2013 2037   GLUCOSEU NEGATIVE 08/18/2013 2037   HGBUR NEGATIVE 08/18/2013 2037   BILIRUBINUR NEGATIVE 08/18/2013 2037   KETONESUR NEGATIVE 08/18/2013 2037   PROTEINUR 30 (A) 08/18/2013 2037   UROBILINOGEN 0.2 08/18/2013 2037   NITRITE NEGATIVE 08/18/2013 2037   LEUKOCYTESUR NEGATIVE 08/18/2013 2037   Sepsis Labs Invalid input(s): PROCALCITONIN,  WBC,  LACTICIDVEN Microbiology Recent Results (from the past 240 hour(s))  MRSA PCR Screening     Status: None   Collection Time: 03/07/16  3:19 AM  Result Value Ref Range Status   MRSA by PCR NEGATIVE NEGATIVE Final    Comment:        The GeneXpert MRSA Assay (FDA approved for NASAL specimens only), is one component of a comprehensive MRSA colonization surveillance program. It is not intended to diagnose MRSA infection nor to guide or monitor treatment for MRSA infections.      Time coordinating discharge: 35 minutes  SIGNED:  Pamella Pert, MD  [facility] 03/07/2016, 4:43 PM Pager [phone]  If 7PM-7AM, please contact night-coverage [email or web address] Password [email or web address]

## 2016-03-07 NOTE — Progress Notes (Signed)
Patient seen and examined this morning, admitted overnight by Dr. Gilford Silvius, H&P reviewed and agree with the assessment and plan.  In brief, this 71 year old male with history of CAD s/p CABG 1996 with redo CABG 08/2011, PAF on eliquis and amiodarone, HTN, HLD, and OSA on CPAP presents with complaints of palpitation and chest pain   Chest pain - Cardiology consulted, appreciate input. Cardiac markers negative. 2-D echo pending. - D-dimer is negative  PAF - Currently in sinus bradycardia, per patient his heart rate was greater than 100 last night when he had palpitations  Hypertension - Continue home medications  OSA - CPAP  Chronic combined systolic and diastolic CHF - 2-D echo pending, patient appears euvolemic  Hyperglycemia - A1c pending   Costin M. Elvera Lennox, MD [facility] [phone]  If 7PM-7AM, please contact night-coverage [email or web address] Password [facility]  03/03/2016, 5:37 AM

## 2016-03-07 NOTE — Discharge Instructions (Signed)
Follow with Dr. Rennis Golden in a week. Cardiology clinic will arrange for an outpatient monitor  Please get a complete blood count and chemistry panel checked by your Primary MD at your next visit, and again as instructed by your Primary MD. Please get your medications reviewed and adjusted by your Primary MD.  Please request your Primary MD to go over all Hospital Tests and Procedure/Radiological results at the follow up, please get all Hospital records sent to your Prim MD by signing hospital release before you go home.  If you had Pneumonia of Lung problems at the Hospital: Please get a 2 view Chest X ray done in 6-8 weeks after hospital discharge or sooner if instructed by your Primary MD.  If you have Congestive Heart Failure: Please call your Cardiologist or Primary MD anytime you have any of the following symptoms:  1) 3 pound weight gain in 24 hours or 5 pounds in 1 week  2) shortness of breath, with or without a dry hacking cough  3) swelling in the hands, feet or stomach  4) if you have to sleep on extra pillows at night in order to breathe  Follow cardiac low salt diet and 1.5 lit/day fluid restriction.  If you have diabetes Accuchecks 4 times/day, Once in AM empty stomach and then before each meal. Log in all results and show them to your primary doctor at your next visit. If any glucose reading is under 80 or above 300 call your primary MD immediately.  If you have Seizure/Convulsions/Epilepsy: Please do not drive, operate heavy machinery, participate in activities at heights or participate in high speed sports until you have seen by Primary MD or a Neurologist and advised to do so again.  If you had Gastrointestinal Bleeding: Please ask your Primary MD to check a complete blood count within one week of discharge or at your next visit. Your endoscopic/colonoscopic biopsies that are pending at the time of discharge, will also need to followed by your Primary MD.  Get Medicines  reviewed and adjusted. Please take all your medications with you for your next visit with your Primary MD  Please request your Primary MD to go over all hospital tests and procedure/radiological results at the follow up, please ask your Primary MD to get all Hospital records sent to his/her office.  If you experience worsening of your admission symptoms, develop shortness of breath, life threatening emergency, suicidal or homicidal thoughts you must seek medical attention immediately by calling 911 or calling your MD immediately  if symptoms less severe.  You must read complete instructions/literature along with all the possible adverse reactions/side effects for all the Medicines you take and that have been prescribed to you. Take any new Medicines after you have completely understood and accpet all the possible adverse reactions/side effects.   Do not drive or operate heavy machinery when taking Pain medications.   Do not take more than prescribed Pain, Sleep and Anxiety Medications  Special Instructions: If you have smoked or chewed Tobacco  in the last 2 yrs please stop smoking, stop any regular Alcohol  and or any Recreational drug use.  Wear Seat belts while driving.  Please note You were cared for by a hospitalist during your hospital stay. If you have any questions about your discharge medications or the care you received while you were in the hospital after you are discharged, you can call the unit and asked to speak with the hospitalist on call if the hospitalist that took  care of you is not available. Once you are discharged, your primary care physician will handle any further medical issues. Please note that NO REFILLS for any discharge medications will be authorized once you are discharged, as it is imperative that you return to your primary care physician (or establish a relationship with a primary care physician if you do not have one) for your aftercare needs so that they can  reassess your need for medications and monitor your lab values.  You can reach the hospitalist office at phone [phone] or fax [phone]   If you do not have a primary care physician, you can call [phone] for a physician referral.  Activity: As tolerated with Full fall precautions use walker/cane & assistance as needed  Diet: heart healthy  Disposition Home

## 2016-03-07 NOTE — H&P (Addendum)
History and Physical    ZADAN ENSZ KKX:[id number] DOB: September 22, 1945 DOA: 03/06/2016  PCP: [name], MD  Patient coming from: Home.  Chief Complaint: Palpitation and chest pain.  HPI: Nicholas Lynn is a 71 y.o. male with PMH of CAD s/p CABG 1996 with redo CABG 08/2011, PAF on eliquis and amiodarone, HTN, HLD, and OSA on CPAP presents with complaints of palpitation and chest pain. Last evening at home patient had brief episode of palpitation which lasted for 10 minutes and resolved without any intervention. Subsequent to that patient started developing chest pressure retrosternal and soreness in the right side of the chest. By the time patient reached ER chest pain resolved. EKG shows nonspecific findings. Chest x-ray was unremarkable. Patient also was still complaining of pleuritic-type of chest pain for which CT angiogram of the chest was ordered. Denies any nausea vomiting diaphoresis.   ED Course: Chest x-ray unremarkable EKG non-specific findings and cardiac markers negative. While waiting in the ER patient did take his home dose of metoprolol. Patient is mildly bradycardic at this time.  Review of Systems: As per HPI, rest all negative.   Past Medical History:  Diagnosis Date  . A-fib (HCC)   . Arthritis   . COPD (chronic obstructive pulmonary disease) (HCC)    mild on singulair  . Coronary artery disease 1998; 1996; 2013   PTCA LCX; CABG X 4; RE-do CABG  . GERD (gastroesophageal reflux disease)   . Hernia    umbilical  . Hx of myocardial infarction 1983   medically treated   . Hyperlipidemia   . Hypertension   . OSA on CPAP     Past Surgical History:  Procedure Laterality Date  . ANGIOPLASTY  1993   LCX  . CARDIAC CATHETERIZATION  2008;2013  . CARDIOVERSION N/A 12/25/2012   Procedure: CARDIOVERSION;  Surgeon: Chrystie Nose, MD;  Location: [facility] ENDOSCOPY;  Service: Cardiovascular;  Laterality: N/A;  . CATARACT EXTRACTION    . CORONARY ARTERY BYPASS GRAFT  1996    3 VESSELS  . CORONARY ARTERY BYPASS GRAFT  09/11/2011   Procedure: REDO CORONARY ARTERY BYPASS GRAFTING (CABG);  Surgeon: Delight Ovens, MD;  Location: [facility] OR;  Service: Open Heart Surgery;  Laterality: N/A;  Redo Coronary Artery Bypass Graft times three utilizing the right internal mammary artery and the left  and right greater saphenous veins.  Marland Kitchen GRAFT(S) ANGIOGRAM  09/01/2011   Procedure: GRAFT(S) Rosalin Hawking;  Surgeon: Marykay Lex, MD;  Location: [facility] CATH LAB;  Service: Cardiovascular;;  . LEFT HEART CATHETERIZATION WITH CORONARY ANGIOGRAM N/A 09/01/2011   Procedure: LEFT HEART CATHETERIZATION WITH CORONARY ANGIOGRAM;  Surgeon: Marykay Lex, MD;  Location: [facility] CATH LAB;  Service: Cardiovascular;  Laterality: N/A;  . TEE WITHOUT CARDIOVERSION  09/11/2011   Procedure: TRANSESOPHAGEAL ECHOCARDIOGRAM (TEE);  Surgeon: Delight Ovens, MD;  Location: [facility] OR;  Service: Open Heart Surgery;  Laterality: N/A;  . TEE WITHOUT CARDIOVERSION N/A 12/25/2012   Procedure: TRANSESOPHAGEAL ECHOCARDIOGRAM (TEE);  Surgeon: Chrystie Nose, MD;  Location: [facility] ENDOSCOPY;  Service: Cardiovascular;  Laterality: N/A;  . UMBILICAL HERNIA REPAIR  04/04/2011   Procedure: HERNIA REPAIR UMBILICAL ADULT;  Surgeon: Adolph Pollack, MD;  Location: [facility] ORS;  Service: General;  Laterality: N/A;  Umbilical Hernia Repair     reports that he quit smoking about 29 years ago. He has never used smokeless tobacco. He reports that he does not drink alcohol or use drugs.  Allergies  Allergen Reactions  .  Sulfa Antibiotics Swelling    Family History  Problem Relation Age of Onset  . Stroke Mother   . Cancer Father     lung  . Cancer Sister     lung  . Cancer Sister     liver    Prior to Admission medications   Medication Sig Start Date End Date Taking? Authorizing Provider  amiodarone (PACERONE) 200 MG tablet take 1 tablet by mouth once daily 08/17/15  Yes Chrystie Nose, MD  aspirin EC 81 MG tablet Take 81 mg by mouth  daily.   Yes Historical Provider, MD  atorvastatin (LIPITOR) 10 MG tablet TAKE 1 TABLET BY MOUTH ONCE DAILY AT 6:00PM 12/20/15  Yes Chrystie Nose, MD  Bioflavonoid Products (ESTER C PO) Take 500 mg by mouth daily.     Yes Historical Provider, MD  cetirizine (ZYRTEC) 10 MG tablet Take 10 mg by mouth daily.     Yes Historical Provider, MD  Cholecalciferol (VITAMIN D-3 PO) Take 1,000 Units by mouth 2 (two) times daily.    Yes Historical Provider, MD  ELIQUIS 5 MG TABS tablet take 1 tablet by mouth twice a day 02/21/16  Yes Chrystie Nose, MD  ezetimibe (ZETIA) 10 MG tablet Take 1 tablet (10 mg total) by mouth daily. 08/09/15  Yes Chrystie Nose, MD  fluticasone (FLONASE) 50 MCG/ACT nasal spray Place 1 spray into both nostrils every morning.  03/02/14  Yes Historical Provider, MD  folic acid (FOLVITE) 1 MG tablet take 1 tablet by mouth once daily 10/20/15  Yes Chrystie Nose, MD  metoprolol tartrate (LOPRESSOR) 25 MG tablet Take 0.5 tablets (12.5 mg total) by mouth 2 (two) times daily. 09/15/15  Yes Chrystie Nose, MD  Misc Natural Products (OSTEO BI-FLEX ADV TRIPLE ST PO) Take 1 tablet by mouth daily.    Yes Historical Provider, MD  montelukast (SINGULAIR) 10 MG tablet Take 10 mg by mouth at bedtime.    Yes Historical Provider, MD  Multiple Vitamin (MULTIVITAMIN WITH MINERALS) TABS Take 1 tablet by mouth daily.   Yes Historical Provider, MD  niacin (NIASPAN) 1000 MG CR tablet take 1 tablet by mouth at bedtime 10/20/15  Yes Chrystie Nose, MD  NITROSTAT 0.4 MG SL tablet PLACE 1 TABLET UNDER THE TONGUE EVERY 5 MINUTES  AS NEEDED FOR CHEST PAIN( MAX 3 DOSES, 5 MINUTES APART) 09/02/14  Yes Chrystie Nose, MD  Omega-3 Fatty Acids (FISH OIL PO) Take 1,000 mg by mouth 2 (two) times daily.     Yes Historical Provider, MD  omeprazole (PRILOSEC) 20 MG capsule Take 20 mg by mouth daily.    Yes Historical Provider, MD  POLY-IRON 150 150 MG capsule TAKE 1 CAPSULE BY MOUTH ONCE DAILY 09/15/15  Yes Chrystie Nose, MD   valsartan-hydrochlorothiazide (DIOVAN-HCT) 160-12.5 MG tablet take 1 tablet once daily 10/20/15  Yes Chrystie Nose, MD    Physical Exam: Vitals:   03/07/16 0030 03/07/16 0045 03/07/16 0100 03/07/16 0115  BP: 128/62 125/60 110/60 (!) 116/49  Pulse: (!) 43 (!) 43 (!) 44 (!) 44  Resp: 16 16 16 15   Temp:      TempSrc:      SpO2: 95% 93% 94% 94%      Constitutional: Moderately built and nourished. Vitals:   03/07/16 0030 03/07/16 0045 03/07/16 0100 03/07/16 0115  BP: 128/62 125/60 110/60 (!) 116/49  Pulse: (!) 43 (!) 43 (!) 44 (!) 44  Resp: 16 16 16 15   Temp:  TempSrc:      SpO2: 95% 93% 94% 94%   Eyes: Anicteric. No pallor. ENMT: No discharge from the ears eyes nose and mouth. Neck: No mass felt. No neck rigidity. No JVD appreciated. Respiratory: No rhonchi or crepitations. Cardiovascular: S1 and S2 heard no murmurs appreciated. Abdomen: Soft nontender bowel sounds present. No guarding or rigidity. Musculoskeletal: No edema. No joint effusion. Skin: No rash. Skin appears warm. Neurologic: Alert awake oriented to time place and person. Moves all extremities. Psychiatric: Appears normal. Normal affect.   Labs on Admission: I have personally reviewed following labs and imaging studies  CBC:  Recent Labs Lab 03/06/16 1900  WBC 11.1*  HGB 15.6  HCT 45.8  MCV 90.5  PLT 161   Basic Metabolic Panel:  Recent Labs Lab 03/06/16 1900  NA 137  K 4.1  CL 103  CO2 21*  GLUCOSE 160*  BUN 14  CREATININE 1.08  CALCIUM 9.5  MG 1.9   GFR: Estimated Creatinine Clearance: 86.1 mL/min (by C-G formula based on SCr of 1.08 mg/dL). Liver Function Tests: No results for input(s): AST, ALT, ALKPHOS, BILITOT, PROT, ALBUMIN in the last 168 hours. No results for input(s): LIPASE, AMYLASE in the last 168 hours. No results for input(s): AMMONIA in the last 168 hours. Coagulation Profile: No results for input(s): INR, PROTIME in the last 168 hours. Cardiac Enzymes: No  results for input(s): CKTOTAL, CKMB, CKMBINDEX, TROPONINI in the last 168 hours. BNP (last 3 results) No results for input(s): PROBNP in the last 8760 hours. HbA1C: No results for input(s): HGBA1C in the last 72 hours. CBG: No results for input(s): GLUCAP in the last 168 hours. Lipid Profile: No results for input(s): CHOL, HDL, LDLCALC, TRIG, CHOLHDL, LDLDIRECT in the last 72 hours. Thyroid Function Tests: No results for input(s): TSH, T4TOTAL, FREET4, T3FREE, THYROIDAB in the last 72 hours. Anemia Panel: No results for input(s): VITAMINB12, FOLATE, FERRITIN, TIBC, IRON, RETICCTPCT in the last 72 hours. Urine analysis:    Component Value Date/Time   COLORURINE YELLOW 09/08/2011 1203   APPEARANCEUR CLEAR 09/08/2011 1203   LABSPEC 1.020 08/18/2013 2037   PHURINE 6.0 08/18/2013 2037   GLUCOSEU NEGATIVE 08/18/2013 2037   HGBUR NEGATIVE 08/18/2013 2037   BILIRUBINUR NEGATIVE 08/18/2013 2037   KETONESUR NEGATIVE 08/18/2013 2037   PROTEINUR 30 (A) 08/18/2013 2037   UROBILINOGEN 0.2 08/18/2013 2037   NITRITE NEGATIVE 08/18/2013 2037   LEUKOCYTESUR NEGATIVE 08/18/2013 2037   Sepsis Labs: @LABRCNTIP (procalcitonin:4,lacticidven:4) )No results found for this or any previous visit (from the past 240 hour(s)).   Radiological Exams on Admission: Dg Chest 2 View  Result Date: 03/06/2016 CLINICAL DATA:  Acute onset of palpitations.  Initial encounter. EXAM: CHEST  2 VIEW COMPARISON:  Chest radiograph performed 02/21/2016 FINDINGS: The lungs are well-aerated and clear. There is no evidence of focal opacification, pleural effusion or pneumothorax. The heart is normal in size; the patient is status post median sternotomy, with evidence of prior CABG. No acute osseous abnormalities are seen. Anterior bridging osteophytes are noted along the thoracic spine. IMPRESSION: No acute cardiopulmonary process seen. Electronically Signed   By: Roanna Raider M.D.   On: 03/06/2016 19:27    EKG: Independently  reviewed. Normal sinus rhythm. Nonspecific findings.  Assessment/Plan Principal Problem:   Chest pain Active Problems:   CAD, CABG '96, re do CABG 09/11/11 X 3   Sleep apnea, on C-pap    PAF recurrent- s/p recent DCCV on Amiodarone 12/26/12   Ischemic cardiomyopathy, "moderate LVD" at cath  09/01/11   Bradycardia    1. Chest pain - has typical and atypical features. Since patient also has a pleuritic component for the chest pain I have ordered CT angiogram of the chest. Will cycle cardiac markers. Patient is on metoprolol statins and also on anticoagulation. Check 2-D echo. Consult cardiology in a.m. When necessary nitroglycerin for pain. 2. Paroxysmal atrial fibrillation - has had brief episode of palpitations last evening. Check TSH. Presently patient is bradycardic. I placed holding orders for metoprolol. Patient is also on amiodarone. Since possible cardiac procedures anticipated Apixaban on hold and placed patient on heparin. 3. Hypertension - continue ARB hydrochlorothiazide and metoprolol. 4. OSA on CPAP. 5. History of ischemic cardiomyopathy - follow-up 2-D echo. Appears compensated. 6. Hyperglycemia - check hemoglobin A1c.   DVT prophylaxis: Heparin. Code Status: Full code.  Family Communication: Patient's wife.  Disposition Plan: Home.  Consults called: None.  Admission status: Observation.    Eduard Clos MD [facility] Pager [phone].  If 7PM-7AM, please contact night-coverage [email or web address] Password [email or web address]  03/07/2016, 2:06 AM

## 2016-03-08 ENCOUNTER — Other Ambulatory Visit: Payer: Self-pay | Admitting: Internal Medicine

## 2016-03-08 LAB — HEMOGLOBIN A1C
Hgb A1c MFr Bld: 6 % — ABNORMAL HIGH (ref 4.8–5.6)
Mean Plasma Glucose: 126 mg/dL

## 2016-03-09 ENCOUNTER — Other Ambulatory Visit: Payer: Self-pay | Admitting: Interventional Cardiology

## 2016-03-09 ENCOUNTER — Ambulatory Visit (INDEPENDENT_AMBULATORY_CARE_PROVIDER_SITE_OTHER): Payer: Medicare Other

## 2016-03-09 DIAGNOSIS — R002 Palpitations: Secondary | ICD-10-CM | POA: Diagnosis not present

## 2016-03-09 DIAGNOSIS — R001 Bradycardia, unspecified: Secondary | ICD-10-CM

## 2016-03-09 DIAGNOSIS — I4891 Unspecified atrial fibrillation: Secondary | ICD-10-CM | POA: Diagnosis not present

## 2016-03-18 ENCOUNTER — Other Ambulatory Visit: Payer: Self-pay | Admitting: Internal Medicine

## 2016-04-02 ENCOUNTER — Other Ambulatory Visit: Payer: Self-pay | Admitting: Internal Medicine

## 2016-04-11 ENCOUNTER — Other Ambulatory Visit: Payer: Self-pay | Admitting: Internal Medicine

## 2016-06-05 ENCOUNTER — Ambulatory Visit (INDEPENDENT_AMBULATORY_CARE_PROVIDER_SITE_OTHER): Payer: Medicare Other | Admitting: Internal Medicine

## 2016-06-05 ENCOUNTER — Encounter: Payer: Self-pay | Admitting: Internal Medicine

## 2016-06-05 VITALS — BP 135/69 | HR 55 | Ht 74.5 in | Wt 272.6 lb

## 2016-06-05 DIAGNOSIS — I10 Essential (primary) hypertension: Secondary | ICD-10-CM | POA: Diagnosis not present

## 2016-06-05 DIAGNOSIS — I255 Ischemic cardiomyopathy: Secondary | ICD-10-CM

## 2016-06-05 DIAGNOSIS — G4733 Obstructive sleep apnea (adult) (pediatric): Secondary | ICD-10-CM

## 2016-06-05 DIAGNOSIS — I480 Paroxysmal atrial fibrillation: Secondary | ICD-10-CM | POA: Diagnosis not present

## 2016-06-05 DIAGNOSIS — Z9989 Dependence on other enabling machines and devices: Secondary | ICD-10-CM

## 2016-06-05 NOTE — Patient Instructions (Addendum)
Your physician wants you to follow-up in: 12 months with Dr. Hilty. You will receive a reminder letter in the mail two months in advance. If you don't receive a letter, please call our office to schedule the follow-up appointment.  

## 2016-06-06 NOTE — Progress Notes (Signed)
Grossly normal   OFFICE NOTE  Chief Complaint:  No complaints, feels well  Primary Care Physician: [name], MD  HPI:  Nicholas Lynn is a 71 year old male has a history of coronary artery disease with previous anterior infarction in 1988 treated with angioplasty. He had bypass grafting in 1996 and then redo bypass grafting in August of 2013. His grafts are detailed in the problem list. He had done well since bypass did have atrial fibrillation was on warfarin until January of this year as well as amiodarone. In January both amiodarone and warfarin were discontinued. He completed rehabilitation and had felt well without recurrence of arrhythmia. He was in his usual state of health but around 5 PM the day of admission after getting up from the bathroom noted the onset of rapid palpitations and irregular heartbeat. He came to the emergency room where he was found to be in rapid atrial fibrillation. He was given metoprolol as well as diltiazem with some slowing of his heart rate. He did not have any angina and denied symptoms of heart failure. He had no PND, orthopnea or significant edema. He has no significant claudication. He was admitted and started on Eliquis and amiodarone. TEE/DCCv was arranged and completed on 12/25/12. He converted without complications. 2D echo on 12/23/12 revealed and EF of 35-45% with wall motion abnormalities. Amiodarone 400mg  for seven days total then decreased to 400mg  daily. He recently saw [name], PA-C in followup and was noted to be bradycardic. His amiodarone was further decreased to 200 mg daily.  Despite his bradycardia with heart rate in the 40s, he reportedly is asymptomatic and is able to do most activities without much problem. He has noted however that his blood pressure continues to creep up somewhat and has been in the 150s or 160s systolic. At his last office visit I started him on Diovan HCTZ and he subsequently followed up with [name] and his blood  pressure was much better controlled.  He's done generally well since that time however today in followup he reports he's been having recent fevers up to 10 3 at night. This is associated with some night sweats and shaking chills. It seems to be cyclical. He was seen in urgent care workup for possible tick bites in [facility] spotted fever. This was negative. He is continue to followup with his primary care provider for additional workup as to possible causes of his constitutional symptoms.  I saw Nicholas Lynn back in the office today. He reports doing very well. He continues to exercise for 5 times a week. He says it difficult for him to get his heart rate much greater than 80 on the treadmill. This is likely due to his amiodarone and dose of metoprolol. Fortunately these medications are helping him maintain a sinus rhythm. He is asymptomatic with regards shortness of breath or any chest pain. Heart rate today is 48 and regular. He seems to be maintaining sinus rhythm. Blood pressure is well-controlled.  Nicholas Lynn returns today for follow-up. He is without complaints. Blood pressure was initially elevated at 162/60 became down to 134/68. He denies any chest pain or worsening shortness of breath. He is on amiodarone 200 mg daily in addition to metoprolol 25 mg twice a day. Heart rate is in the mid 40s. He has had a low heart rate in the 40s for several years and seems to be stable with that. He's not had any worsening chest pain, shortness of breath or decreased exercise  tolerance. In fact he feels excellent. He can exercise and gets his heart rate generally up into the 80s or 90s. For this reason I did not see a need to decrease his medicine at this time.  Nicholas Lynn returns today for follow-up. Again he is bradycardic in the 40s. He says he has no symptoms with this although with exercise he does say that his heart rate generally only gets up to the 70s or 80s which is even lower than it had been  previously. Blood pressure is within normal limits. EKG shows sinus bradycardia which is marked at 44 with aberrant PACs. He does report some fatigue however is not clear whether this is related to heart rate or not. He has been on amiodarone without recurrent A. fib. He is due for repeat laboratory work. Although he had recent labs from his primary care provider, he did not have a TSH which is required on amiodarone. He did have liver function tests which were normal. He will also need repeat pulmonary function tests.  09/11/2015  Nicholas Lynn returns today for follow-up. He denies any chest pain or worsening shortness of breath. He does not seem to be symptomatic with his bradycardia. We decreased his beta blocker further at his last visit but it does not seem to change his heart rate all. He continues to be able to exercise and is asymptomatic.  06/05/2016  Nicholas Lynn returns today for follow-up. He is without complaints. Overall he is unaware of any recurrent atrial fibrillation. He's been on amiodarone 200 mg daily. Today's EKG shows sinus bradycardia with a QTC of 495 ms and septal infarct pattern. He continues to be able to exercise several times a week and is asymptomatic with this. He denies bleeding problems on Eliquis.  PMHx:  Past Medical History:  Diagnosis Date  . A-fib (HCC)   . Arthritis   . COPD (chronic obstructive pulmonary disease) (HCC)    mild on singulair  . Coronary artery disease 1998; 1996; 2013   PTCA LCX; CABG X 4; RE-do CABG  . GERD (gastroesophageal reflux disease)   . Hernia    umbilical  . Hx of myocardial infarction 1983   medically treated   . Hyperlipidemia   . Hypertension   . OSA on CPAP     Past Surgical History:  Procedure Laterality Date  . ANGIOPLASTY  1993   LCX  . CARDIAC CATHETERIZATION  2008;2013  . CARDIOVERSION N/A 12/25/2012   Procedure: CARDIOVERSION;  Surgeon: Chrystie Nose, MD;  Location: [facility] ENDOSCOPY;  Service: Cardiovascular;   Laterality: N/A;  . CATARACT EXTRACTION    . CORONARY ARTERY BYPASS GRAFT  1996   3 VESSELS  . CORONARY ARTERY BYPASS GRAFT  09/11/2011   Procedure: REDO CORONARY ARTERY BYPASS GRAFTING (CABG);  Surgeon: Delight Ovens, MD;  Location: [facility] OR;  Service: Open Heart Surgery;  Laterality: N/A;  Redo Coronary Artery Bypass Graft times three utilizing the right internal mammary artery and the left  and right greater saphenous veins.  Marland Kitchen GRAFT(S) ANGIOGRAM  09/01/2011   Procedure: GRAFT(S) Rosalin Hawking;  Surgeon: Marykay Lex, MD;  Location: [facility] CATH LAB;  Service: Cardiovascular;;  . LEFT HEART CATHETERIZATION WITH CORONARY ANGIOGRAM N/A 09/01/2011   Procedure: LEFT HEART CATHETERIZATION WITH CORONARY ANGIOGRAM;  Surgeon: Marykay Lex, MD;  Location: [facility] CATH LAB;  Service: Cardiovascular;  Laterality: N/A;  . TEE WITHOUT CARDIOVERSION  09/11/2011   Procedure: TRANSESOPHAGEAL ECHOCARDIOGRAM (TEE);  Surgeon: Delight Ovens, MD;  Location: [facility] OR;  Service: Open Heart Surgery;  Laterality: N/A;  . TEE WITHOUT CARDIOVERSION N/A 12/25/2012   Procedure: TRANSESOPHAGEAL ECHOCARDIOGRAM (TEE);  Surgeon: Chrystie Nose, MD;  Location: [facility] ENDOSCOPY;  Service: Cardiovascular;  Laterality: N/A;  . UMBILICAL HERNIA REPAIR  04/04/2011   Procedure: HERNIA REPAIR UMBILICAL ADULT;  Surgeon: Adolph Pollack, MD;  Location: [facility] ORS;  Service: General;  Laterality: N/A;  Umbilical Hernia Repair    FAMHx:  Family History  Problem Relation Age of Onset  . Stroke Mother   . Cancer Father        lung  . Cancer Sister        lung  . Cancer Sister        liver    SOCHx:   reports that he quit smoking about 30 years ago. He has never used smokeless tobacco. He reports that he does not drink alcohol or use drugs.  ALLERGIES:  Allergies  Allergen Reactions  . Sulfa Antibiotics Swelling    ROS: A comprehensive review of systems was negative.  HOME MEDS: Current Outpatient Prescriptions  Medication Sig  Dispense Refill  . amiodarone (PACERONE) 200 MG tablet take 1 tablet by mouth once daily 30 tablet 6  . aspirin EC 81 MG tablet Take 81 mg by mouth daily.    Marland Kitchen atorvastatin (LIPITOR) 10 MG tablet take 1 tablet by mouth once daily AT 6:00 PM 120 tablet 0  . Bioflavonoid Products (ESTER C PO) Take 500 mg by mouth daily.      . cetirizine (ZYRTEC) 10 MG tablet Take 10 mg by mouth daily.      . Cholecalciferol (VITAMIN D-3 PO) Take 1,000 Units by mouth 2 (two) times daily.     Marland Kitchen ELIQUIS 5 MG TABS tablet take 1 tablet by mouth twice a day 180 tablet 1  . ezetimibe (ZETIA) 10 MG tablet take 1 tablet by mouth once daily 30 tablet 4  . fluticasone (FLONASE) 50 MCG/ACT nasal spray Place 1 spray into both nostrils every morning.   1  . folic acid (FOLVITE) 1 MG tablet take 1 tablet by mouth once daily 30 tablet 10  . metoprolol tartrate (LOPRESSOR) 25 MG tablet Take 0.5 tablets (12.5 mg total) by mouth 2 (two) times daily. 30 tablet 11  . Misc Natural Products (OSTEO BI-FLEX ADV TRIPLE ST PO) Take 1 tablet by mouth daily.     . montelukast (SINGULAIR) 10 MG tablet Take 10 mg by mouth at bedtime.     . Multiple Vitamin (MULTIVITAMIN WITH MINERALS) TABS Take 1 tablet by mouth daily.    . niacin (NIASPAN) 1000 MG CR tablet take 1 tablet by mouth at bedtime 30 tablet 10  . NITROSTAT 0.4 MG SL tablet PLACE 1 TABLET UNDER TONGUE EVERY 5 MINUTES AS NEEDED FOR CHEST PAIN (MAXIMUM OF 3 DOSES 5 MINUTES APART) 25 tablet 2  . Omega-3 Fatty Acids (FISH OIL PO) Take 1,000 mg by mouth 2 (two) times daily.      Marland Kitchen omeprazole (PRILOSEC) 20 MG capsule Take 20 mg by mouth daily.     Marland Kitchen POLY-IRON 150 150 MG capsule TAKE 1 CAPSULE BY MOUTH ONCE DAILY 30 capsule 11  . valsartan-hydrochlorothiazide (DIOVAN-HCT) 160-12.5 MG tablet take 1 tablet once daily 30 tablet 10   No current facility-administered medications for this visit.     LABS/IMAGING: No results found for this or any previous visit (from the past 48 hour(s)). No  results found.  VITALS: BP 135/69  Pulse (!) 55   Ht 6' 2.5" (1.892 m)   Wt 272 lb 9.6 oz (123.7 kg)   BMI 34.53 kg/m   EXAM: General appearance: alert, no distress and mildly obese Neck: no carotid bruit, no JVD and thyroid not enlarged, symmetric, no tenderness/mass/nodules Lungs: clear to auscultation bilaterally Heart: regular rate and rhythm, S1, S2 normal, no murmur, click, rub or gallop and bradycardia Abdomen: soft, non-tender; bowel sounds normal; no masses,  no organomegaly Extremities: extremities normal, atraumatic, no cyanosis or edema Pulses: 2+ and symmetric Skin: Skin color, texture, turgor normal. No rashes or lesions Neurologic: GroPsych: Mood, affect normaly normal Psych: Mood, affect normal  EKG: Sinus bradycardia at 55, QTC 495 ms  ASSESSMENT: 1. Paroxysmal atrial fibrillation status post TEE/cardioversion - maintaining sinus 2. Possibly symptomatic sinus bradycardia on low-dose amiodarone and metoprolol 3. Ischemic cardiomyopathy EF 35-40% 4. Hypertension 5. CAD s/p CABG 6. Asymptomatic bradycardia 7. Anticoagulation on Eliquis - CHADSVASC 6 8. OSA on CPAP  PLAN: 1.   Nicholas Lynn says he feels well. HR is improved somewhat. He is maintaining sinus rhythm. Weight is been stable. He appears euvolemic on exam today. We'll likely recheck an echocardiogram when he returns next year, but otherwise maintain low-dose amiodarone, beta blocker and Eliquis for a  CHADSVASC score 6. He is additionally on metoprolol and valsartan and HCTZ for his cardiomyopathy as well as low dose aspirin for CAD.  Follow-up annually.  Chrystie Nose, MD, [facility] Attending Cardiologist [facility]  Chrystie Nose 06/06/2016, 6:19 PM

## 2016-08-11 ENCOUNTER — Telehealth: Payer: Self-pay

## 2016-08-11 MED ORDER — ATORVASTATIN CALCIUM 10 MG PO TABS: ORAL_TABLET | ORAL | 3 refills | Status: DC

## 2016-08-11 NOTE — Telephone Encounter (Signed)
Patient was seen on 06/05/16 in the office.   Requesting refill of atorvastatin. Will refill for one year supply

## 2016-08-23 ENCOUNTER — Other Ambulatory Visit: Payer: Self-pay | Admitting: Internal Medicine

## 2016-09-05 ENCOUNTER — Other Ambulatory Visit: Payer: Self-pay | Admitting: *Deleted

## 2016-09-05 MED ORDER — EZETIMIBE 10 MG PO TABS: 10.0000 mg | ORAL_TABLET | Freq: Every day | ORAL | 11 refills | Status: DC

## 2016-09-05 NOTE — Telephone Encounter (Signed)
REFILL 

## 2016-09-13 ENCOUNTER — Other Ambulatory Visit: Payer: Self-pay

## 2016-09-13 MED ORDER — VALSARTAN-HYDROCHLOROTHIAZIDE 160-12.5 MG PO TABS: 1.0000 | ORAL_TABLET | Freq: Every day | ORAL | 10 refills | Status: DC

## 2016-09-25 ENCOUNTER — Other Ambulatory Visit: Payer: Self-pay | Admitting: Internal Medicine

## 2016-09-26 ENCOUNTER — Other Ambulatory Visit: Payer: Self-pay | Admitting: *Deleted

## 2016-09-26 MED ORDER — METOPROLOL TARTRATE 25 MG PO TABS: 12.5000 mg | ORAL_TABLET | Freq: Two times a day (BID) | ORAL | 3 refills | Status: DC

## 2016-09-26 NOTE — Telephone Encounter (Signed)
REFILL 

## 2016-10-02 ENCOUNTER — Other Ambulatory Visit: Payer: Self-pay | Admitting: Internal Medicine

## 2016-10-03 NOTE — Telephone Encounter (Signed)
REFILL 

## 2016-10-04 ENCOUNTER — Other Ambulatory Visit: Payer: Self-pay | Admitting: Physician Assistant

## 2016-10-04 NOTE — Telephone Encounter (Signed)
Please review for refill, thanks ! 

## 2016-10-31 ENCOUNTER — Other Ambulatory Visit: Payer: Self-pay | Admitting: Internal Medicine

## 2016-10-31 ENCOUNTER — Other Ambulatory Visit: Payer: Self-pay | Admitting: Physician Assistant

## 2016-10-31 NOTE — Telephone Encounter (Signed)
Please review for refill. Thanks!  

## 2016-11-20 ENCOUNTER — Other Ambulatory Visit: Payer: Self-pay | Admitting: Internal Medicine

## 2017-01-26 ENCOUNTER — Other Ambulatory Visit: Payer: Self-pay | Admitting: Internal Medicine

## 2017-02-16 ENCOUNTER — Other Ambulatory Visit: Payer: Self-pay | Admitting: Internal Medicine

## 2017-02-16 NOTE — Telephone Encounter (Signed)
Rx has been sent to the pharmacy electronically. ° °

## 2017-03-13 ENCOUNTER — Encounter: Payer: Self-pay | Admitting: Physician Assistant

## 2017-03-23 ENCOUNTER — Other Ambulatory Visit: Payer: Self-pay | Admitting: Internal Medicine

## 2017-03-26 ENCOUNTER — Telehealth: Payer: Self-pay | Admitting: Internal Medicine

## 2017-03-26 NOTE — Telephone Encounter (Signed)
Rx(s) sent to pharmacy electronically.  

## 2017-03-26 NOTE — Telephone Encounter (Signed)
Called patient and LVM to call back to schedule yearly followup with Dr. Rennis Golden.

## 2017-03-27 ENCOUNTER — Telehealth: Payer: Self-pay | Admitting: *Deleted

## 2017-03-27 ENCOUNTER — Encounter: Payer: Self-pay | Admitting: Physician Assistant

## 2017-03-27 ENCOUNTER — Ambulatory Visit: Payer: Medicare Other | Admitting: Physician Assistant

## 2017-03-27 VITALS — BP 130/72 | HR 68 | Ht 74.5 in | Wt 282.8 lb

## 2017-03-27 DIAGNOSIS — Z1211 Encounter for screening for malignant neoplasm of colon: Secondary | ICD-10-CM

## 2017-03-27 DIAGNOSIS — Z86010 Personal history of colonic polyps: Secondary | ICD-10-CM

## 2017-03-27 DIAGNOSIS — Z7901 Long term (current) use of anticoagulants: Secondary | ICD-10-CM | POA: Diagnosis not present

## 2017-03-27 MED ORDER — PEG 3350-KCL-NA BICARB-NACL 420 G PO SOLR: ORAL | 0 refills | Status: DC

## 2017-03-27 NOTE — Progress Notes (Signed)
Subjective:    Patient ID: Nicholas Lynn, male    DOB: 19-Jan-1946, 72 y.o.   MRN: [id number]  HPI Nicholas Lynn is a pleasant 72 year old white male, known to Dr. Christella Hartigan who is referred today by Dr. Everlene Other for follow-up colonoscopy. Patient was last seen in March 2008 for colonoscopy, he had a 2 mm and 4 mm polyp removed, both were adenomatous polyps. He was to have 5 year interval follow-up. He currently denies any problems with abdominal discomfort, alteration in bowel habits, melena or hematochezia. Appetite has been good, weight has been stable. He said that he has been side tracked  by a lot of other medical issues. He has history of coronary artery disease is status post CABG and had a redo CABG in 2013, he has an ischemic cardiomyopathy with EF of 35-40% as of 2018, history of sleep apnea, paroxysmal atrial fibrillation, hypertension , COPD-no oxygen use. He is also status post ventral hernia repair with mesh . He is maintained on chronic Eliquis and aspirin. Family history is negative for colon cancer polyps as far he is aware.  Review of Systems Pertinent positive and negative review of systems were noted in the above HPI section.  All other review of systems was otherwise negative.  Outpatient Encounter Medications as of 03/27/2017  Medication Sig  . amiodarone (PACERONE) 200 MG tablet TAKE 1 TABLET BY MOUTH EVERY DAY  . aspirin EC 81 MG tablet Take 81 mg by mouth daily.  Marland Kitchen atorvastatin (LIPITOR) 10 MG tablet Take one tablet daily  . Bioflavonoid Products (ESTER C PO) Take 500 mg by mouth daily.    . cetirizine (ZYRTEC) 10 MG tablet Take 10 mg by mouth daily.    . Cholecalciferol (VITAMIN D-3 PO) Take 1,000 Units by mouth 2 (two) times daily.   Marland Kitchen ELIQUIS 5 MG TABS tablet TAKE 1 TABLET BY MOUTH TWICE A DAY  . ezetimibe (ZETIA) 10 MG tablet Take 1 tablet (10 mg total) by mouth daily.  . fluticasone (FLONASE) 50 MCG/ACT nasal spray Place 1 spray into both nostrils every morning.   . folic  acid (FOLVITE) 1 MG tablet TAKE 1 TABLET BY MOUTH ONCE DAILY  . metoprolol tartrate (LOPRESSOR) 25 MG tablet Take 0.5 tablets (12.5 mg total) by mouth 2 (two) times daily.  . Misc Natural Products (OSTEO BI-FLEX ADV TRIPLE ST PO) Take 1 tablet by mouth daily.   . montelukast (SINGULAIR) 10 MG tablet Take 10 mg by mouth at bedtime.   . Multiple Vitamin (MULTIVITAMIN WITH MINERALS) TABS Take 1 tablet by mouth daily.  . niacin (NIASPAN) 1000 MG CR tablet Take 1 tablet (1,000 mg total) by mouth at bedtime. PLEASE CONTACT OFFICE FOR ADDITIONAL REFILLS  . NITROSTAT 0.4 MG SL tablet PLACE 1 TABLET UNDER TONGUE EVERY 5 MINUTES AS NEEDED FOR CHEST PAIN (MAXIMUM OF 3 DOSES 5 MINUTES APART)  . Omega-3 Fatty Acids (FISH OIL PO) Take 1,000 mg by mouth 2 (two) times daily.    Marland Kitchen omeprazole (PRILOSEC) 20 MG capsule Take 20 mg by mouth daily.   Marland Kitchen POLY-IRON 150 150 MG capsule TAKE 1 CAPSULE BY MOUTH ONCE DAILY  . valsartan-hydrochlorothiazide (DIOVAN-HCT) 160-12.5 MG tablet Take 1 tablet by mouth daily.  . polyethylene glycol-electrolytes (NULYTELY/GOLYTELY) 420 g solution Take as directed for colonoscopy prep.   No facility-administered encounter medications on file as of 03/27/2017.    Allergies  Allergen Reactions  . Sulfa Antibiotics Swelling   Patient Active Problem List   Diagnosis Date Noted  .  Chest pain 03/07/2016  . Bradycardia 01/07/2013  . Chronic anticoagulation 01/01/2013  . Obesity (BMI 30-39.9)   . Ischemic cardiomyopathy, "moderate LVD" at cath 09/01/11 09/13/2011  . PAF recurrent- s/p recent DCCV on Amiodarone 12/26/12   . CAD, CABG '96, re do CABG 09/11/11 X 3 09/01/2011  . Dyslipidemia 09/01/2011  . OSA on CPAP 09/01/2011  . HTN (hypertension)    Social History   Socioeconomic History  . Marital status: Married    Spouse name: Not on file  . Number of children: Not on file  . Years of education: Not on file  . Highest education level: Not on file  Social Needs  . Financial  resource strain: Not on file  . Food insecurity - worry: Not on file  . Food insecurity - inability: Not on file  . Transportation needs - medical: Not on file  . Transportation needs - non-medical: Not on file  Occupational History  . Not on file  Tobacco Use  . Smoking status: Former Smoker    Last attempt to quit: 03/27/1986    Years since quitting: 31.0  . Smokeless tobacco: Never Used  Substance and Sexual Activity  . Alcohol use: No  . Drug use: No  . Sexual activity: Not on file  Other Topics Concern  . Not on file  Social History Narrative   Retired Psychologist, occupational    [name] family history includes Cancer in his father, sister, and sister; Stroke in his mother.      Objective:    Vitals:   03/27/17 1019  BP: 130/72  Pulse: 68    Physical Exam well-developed older white male in no acute distress, very pleasant accompanied by his wife blood pressure 130/72 pulse 68, height 6 foot 2, weight 282, BMI 35.8. HEENT; nontraumatic normocephalic EOMI PERRLA sclera anicteric, Cardiovascular ;regular rate and rhythm with S1-S2, Pulmonary; clear bilaterally, Abdomen; obese, soft nontender nondistended bowel sounds are active there is no palpable mass or hepatosplenomegaly  Rectal; exam not done, Ext; no clubbing cyanosis or edema skin warm and dry, Neuropsych ;mood and affect appropriate       Assessment & Plan:   #36 72 year old white male overdue for follow-up colonoscopy, with history of adenomatous colon polyps, found a colonoscopy March 2008. #2 chronic anticoagulation-on eliquis and aspirin #3 history of atrial fibrillation #4 ischemic cardiomyopathy with EF 35% #5 coronary artery disease status post remote CABG and redo CABG 2013 #6 obstructive sleep apnea-no oxygen use #7 COPD #8 status post ventral hernia repair with mesh  Plan; Patient will be scheduled for colonoscopy with Dr. Christella Hartigan at [facility], on April 11, due to EF of 35%  Procedure was discussed in  detail with the patient including indications risks and benefits and he is agreeable to proceed.. Patient will need to hold Eliquis for 48 hours prior to procedure. We will communicate with his cardiologist Dr. Rennis Golden to assure this is reasonable for this patient.  Amy S Esterwood PA-C 03/27/2017   Cc: Tracey Harries, MD

## 2017-03-27 NOTE — Telephone Encounter (Signed)
  03/27/2017   RE: Nicholas Lynn DOB: 02-12-1945 MRN: [id number]   Dear Dr.K. [place] Hilty,    We have scheduled the above patient for an endoscopic procedure. Our records show that he is on anticoagulation therapy.   Please advise as to how long the patient may come off his therapy of Eliquis prior to the procedure, which is scheduled for 05-03-2017. Dr. Rob Bunting will be performing the Colonoscopy at [facility].   Please route the Eliquis clearance instructions to [facility] CMA.    Sincerely,    Amy Esterwood PA-C

## 2017-03-27 NOTE — Patient Instructions (Signed)
You have been scheduled for a colonoscopy. Please follow written instructions given to you at your visit today.  Please pick up your prep supplies at the pharmacy within the next 1-3 days. If you use inhalers (even only as needed), please bring them with you on the day of your procedure. Your physician has requested that you go to www.startemmi.com and enter the access code given to you at your visit today. This web site gives a general overview about your procedure. However, you should still follow specific instructions given to you by our office regarding your preparation for the procedure.  If you are age 26 or older, your body mass index should be between 23-30. Your Body mass index is 35.82 kg/m. If this is out of the aforementioned range listed, please consider follow up with your Primary Care Provider.

## 2017-03-28 NOTE — Progress Notes (Signed)
I agree with the above note, plan 

## 2017-03-29 NOTE — Telephone Encounter (Signed)
Pt takes Eliquis for afib with CHADS2VASc score of 4 (age, HTN, CHF, CAD). Renal function is normal. Recommend holding Eliquis for 24 hours prior to procedure per protocol.

## 2017-03-29 NOTE — Telephone Encounter (Signed)
Chart reviewed, last seen by Dr. Rennis Golden in May 2018, will forward to clinical pharmacist regarding how long to hold eliquis prior to procedure.

## 2017-03-30 NOTE — Telephone Encounter (Signed)
Amy, please see recommendations from our pharmacist regarding holding anticoagulation.  Corine Shelter PA-C 03/30/2017 2:26 PM

## 2017-04-02 NOTE — Telephone Encounter (Signed)
Called and advised the patient he is to be off the Eliquis 4-10 and 4-11. He can resume it after the procedure.

## 2017-04-02 NOTE — Telephone Encounter (Signed)
Pam - please let pt know

## 2017-04-24 ENCOUNTER — Encounter (HOSPITAL_COMMUNITY): Payer: Self-pay | Admitting: *Deleted

## 2017-04-24 ENCOUNTER — Other Ambulatory Visit: Payer: Self-pay | Admitting: Internal Medicine

## 2017-04-24 ENCOUNTER — Other Ambulatory Visit: Payer: Self-pay

## 2017-04-24 NOTE — Telephone Encounter (Signed)
Rx has been sent to the pharmacy electronically. ° °

## 2017-05-02 NOTE — Anesthesia Preprocedure Evaluation (Addendum)
Anesthesia Evaluation  Patient identified by MRN, date of birth, ID band Patient awake    Reviewed: Allergy & Precautions, H&P , Patient's Chart, lab work & pertinent test results, reviewed documented beta blocker date and time   Airway Mallampati: II  TM Distance: >3 FB Neck ROM: full    Dental no notable dental hx.    Pulmonary sleep apnea and Continuous Positive Airway Pressure Ventilation , former smoker,    Pulmonary exam normal breath sounds clear to auscultation       Cardiovascular hypertension, Atrial Fibrillation  Rhythm:regular Rate:Normal     Neuro/Psych    GI/Hepatic   Endo/Other    Renal/GU      Musculoskeletal   Abdominal   Peds  Hematology   Anesthesia Other Findings 35% EF  Reproductive/Obstetrics                            Anesthesia Physical Anesthesia Plan  ASA: III  Anesthesia Plan: MAC   Post-op Pain Management:    Induction: Intravenous  PONV Risk Score and Plan: Treatment may vary due to age or medical condition  Airway Management Planned: Mask and Natural Airway  Additional Equipment:   Intra-op Plan:   Post-operative Plan:   Informed Consent: I have reviewed the patients History and Physical, chart, labs and discussed the procedure including the risks, benefits and alternatives for the proposed anesthesia with the patient or authorized representative who has indicated his/her understanding and acceptance.   Dental Advisory Given  Plan Discussed with: CRNA and Surgeon  Anesthesia Plan Comments:         Anesthesia Quick Evaluation

## 2017-05-03 ENCOUNTER — Ambulatory Visit (HOSPITAL_COMMUNITY): Payer: Medicare Other | Admitting: Anesthesiology

## 2017-05-03 ENCOUNTER — Encounter (HOSPITAL_COMMUNITY): Payer: Self-pay | Admitting: *Deleted

## 2017-05-03 ENCOUNTER — Ambulatory Visit (HOSPITAL_COMMUNITY)
Admission: RE | Admit: 2017-05-03 | Discharge: 2017-05-03 | Disposition: A | Discharge status: Home / Self Care | Payer: Medicare Other | Source: Ambulatory Visit | Attending: Gastroenterology | Admitting: Gastroenterology

## 2017-05-03 ENCOUNTER — Other Ambulatory Visit: Payer: Self-pay

## 2017-05-03 ENCOUNTER — Encounter (HOSPITAL_COMMUNITY)
Admission: RE | Disposition: A | Discharge status: Home / Self Care | Payer: Self-pay | Source: Ambulatory Visit | Attending: Gastroenterology

## 2017-05-03 DIAGNOSIS — I2510 Atherosclerotic heart disease of native coronary artery without angina pectoris: Secondary | ICD-10-CM | POA: Insufficient documentation

## 2017-05-03 DIAGNOSIS — Z9989 Dependence on other enabling machines and devices: Secondary | ICD-10-CM | POA: Insufficient documentation

## 2017-05-03 DIAGNOSIS — Z7982 Long term (current) use of aspirin: Secondary | ICD-10-CM | POA: Diagnosis not present

## 2017-05-03 DIAGNOSIS — M1990 Unspecified osteoarthritis, unspecified site: Secondary | ICD-10-CM | POA: Insufficient documentation

## 2017-05-03 DIAGNOSIS — Z7901 Long term (current) use of anticoagulants: Secondary | ICD-10-CM

## 2017-05-03 DIAGNOSIS — K648 Other hemorrhoids: Secondary | ICD-10-CM | POA: Diagnosis not present

## 2017-05-03 DIAGNOSIS — Z79899 Other long term (current) drug therapy: Secondary | ICD-10-CM | POA: Insufficient documentation

## 2017-05-03 DIAGNOSIS — Z951 Presence of aortocoronary bypass graft: Secondary | ICD-10-CM | POA: Insufficient documentation

## 2017-05-03 DIAGNOSIS — Z1211 Encounter for screening for malignant neoplasm of colon: Secondary | ICD-10-CM | POA: Diagnosis present

## 2017-05-03 DIAGNOSIS — Z882 Allergy status to sulfonamides status: Secondary | ICD-10-CM | POA: Diagnosis not present

## 2017-05-03 DIAGNOSIS — G4733 Obstructive sleep apnea (adult) (pediatric): Secondary | ICD-10-CM | POA: Diagnosis not present

## 2017-05-03 DIAGNOSIS — K649 Unspecified hemorrhoids: Secondary | ICD-10-CM

## 2017-05-03 DIAGNOSIS — J449 Chronic obstructive pulmonary disease, unspecified: Secondary | ICD-10-CM | POA: Diagnosis not present

## 2017-05-03 DIAGNOSIS — E785 Hyperlipidemia, unspecified: Secondary | ICD-10-CM | POA: Diagnosis not present

## 2017-05-03 DIAGNOSIS — I10 Essential (primary) hypertension: Secondary | ICD-10-CM | POA: Insufficient documentation

## 2017-05-03 DIAGNOSIS — I252 Old myocardial infarction: Secondary | ICD-10-CM | POA: Diagnosis not present

## 2017-05-03 DIAGNOSIS — I4891 Unspecified atrial fibrillation: Secondary | ICD-10-CM | POA: Diagnosis not present

## 2017-05-03 DIAGNOSIS — Z87891 Personal history of nicotine dependence: Secondary | ICD-10-CM | POA: Diagnosis not present

## 2017-05-03 DIAGNOSIS — K219 Gastro-esophageal reflux disease without esophagitis: Secondary | ICD-10-CM | POA: Insufficient documentation

## 2017-05-03 DIAGNOSIS — Z86010 Personal history of colonic polyps: Secondary | ICD-10-CM | POA: Insufficient documentation

## 2017-05-03 DIAGNOSIS — K644 Residual hemorrhoidal skin tags: Secondary | ICD-10-CM | POA: Insufficient documentation

## 2017-05-03 HISTORY — PX: COLONOSCOPY WITH PROPOFOL: SHX5780

## 2017-05-03 SURGERY — COLONOSCOPY WITH PROPOFOL
Anesthesia: Monitor Anesthesia Care

## 2017-05-03 MED ORDER — PROPOFOL 500 MG/50ML IV EMUL
INTRAVENOUS | Status: DC | PRN
  Administered 2017-05-03: 125 ug/kg/min via INTRAVENOUS

## 2017-05-03 MED ORDER — PROPOFOL 10 MG/ML IV BOLUS
INTRAVENOUS | Status: AC
  Filled 2017-05-03: qty 60

## 2017-05-03 MED ORDER — LIDOCAINE 2% (20 MG/ML) 5 ML SYRINGE
INTRAMUSCULAR | Status: DC | PRN
  Administered 2017-05-03: 100 mg via INTRAVENOUS

## 2017-05-03 MED ORDER — PROPOFOL 10 MG/ML IV BOLUS
INTRAVENOUS | Status: DC | PRN
  Administered 2017-05-03: 50 mg via INTRAVENOUS
  Administered 2017-05-03: 20 mg via INTRAVENOUS

## 2017-05-03 MED ORDER — SODIUM CHLORIDE 0.9 % IV SOLN: INTRAVENOUS | Status: DC

## 2017-05-03 MED ORDER — LACTATED RINGERS IV SOLN
INTRAVENOUS | Status: DC
  Administered 2017-05-03: 07:00:00 via INTRAVENOUS

## 2017-05-03 MED ORDER — GLYCOPYRROLATE 0.2 MG/ML IV SOSY
PREFILLED_SYRINGE | INTRAVENOUS | Status: DC | PRN
  Administered 2017-05-03: .2 mg via INTRAVENOUS

## 2017-05-03 SURGICAL SUPPLY — 22 items

## 2017-05-03 NOTE — Discharge Instructions (Signed)
YOU HAD AN ENDOSCOPIC PROCEDURE TODAY: Refer to the procedure report and other information in the discharge instructions given to you for any specific questions about what was found during the examination. If this information does not answer your questions, please call [place] office at [phone] to clarify.  ° °YOU SHOULD EXPECT: Some feelings of bloating in the abdomen. Passage of more gas than usual. Walking can help get rid of the air that was put into your GI tract during the procedure and reduce the bloating. If you had a lower endoscopy (such as a colonoscopy or flexible sigmoidoscopy) you may notice spotting of blood in your stool or on the toilet paper. Some abdominal soreness may be present for a day or two, also. ° °DIET: Your first meal following the procedure should be a light meal and then it is ok to progress to your normal diet. A half-sandwich or bowl of soup is an example of a good first meal. Heavy or fried foods are harder to digest and may make you feel nauseous or bloated. Drink plenty of fluids but you should avoid alcoholic beverages for 24 hours. If you had a esophageal dilation, please see attached instructions for diet.   ° °ACTIVITY: Your care partner should take you home directly after the procedure. You should plan to take it easy, moving slowly for the rest of the day. You can resume normal activity the day after the procedure however YOU SHOULD NOT DRIVE, use power tools, machinery or perform tasks that involve climbing or major physical exertion for 24 hours (because of the sedation medicines used during the test).  ° °SYMPTOMS TO REPORT IMMEDIATELY: °A gastroenterologist can be reached at any hour. Please call [phone]  for any of the following symptoms:  °Following lower endoscopy (colonoscopy, flexible sigmoidoscopy) °Excessive amounts of blood in the stool  °Significant tenderness, worsening of abdominal pains  °Swelling of the abdomen that is new, acute  °Fever of 100° or  higher  °Following upper endoscopy (EGD, EUS, ERCP, esophageal dilation) °Vomiting of blood or coffee ground material  °New, significant abdominal pain  °New, significant chest pain or pain under the shoulder blades  °Painful or persistently difficult swallowing  °New shortness of breath  °Black, tarry-looking or red, bloody stools ° °FOLLOW UP:  °If any biopsies were taken you will be contacted by phone or by letter within the next 1-3 weeks. Call [phone]  if you have not heard about the biopsies in 3 weeks.  °Please also call with any specific questions about appointments or follow up tests. ° °

## 2017-05-03 NOTE — Op Note (Signed)
[facility] Patient Name: Nicholas Lynn Procedure Date: 05/03/2017 MRN: [id number] Attending MD: Rachael Fee , MD Date of Birth: 1945-07-07 CSN: [id number] Age: 72 Admit Type: Outpatient Procedure:                Colonoscopy Indications:              High risk colon cancer surveillance: Personal                            history of colonic polyps. colonoscopy 2008 two                            subCM adenomas. Providers:                Rachael Fee, MD, Harold Barban, RN, Madalyn Rob, Technician, Stephanie [place], CRNA Referring MD:              Medicines:                Monitored Anesthesia Care Complications:            No immediate complications. Estimated blood loss:                            None. Estimated Blood Loss:     Estimated blood loss: none. Procedure:                Pre-Anesthesia Assessment:                           - Prior to the procedure, a History and Physical                            was performed, and patient medications and                            allergies were reviewed. The patient's tolerance of                            previous anesthesia was also reviewed. The risks                            and benefits of the procedure and the sedation                            options and risks were discussed with the patient.                            All questions were answered, and informed consent                            was obtained. Prior Anticoagulants: The patient has                            taken Eliquis (apixaban),  last dose was 2 days                            prior to procedure. ASA Grade Assessment: IV - A                            patient with severe systemic disease that is a                            constant threat to life. After reviewing the risks                            and benefits, the patient was deemed in                            satisfactory condition to undergo  the procedure.                           After obtaining informed consent, the colonoscope                            was passed under direct vision. Throughout the                            procedure, the patient's blood pressure, pulse, and                            oxygen saturations were monitored continuously. The                            EC-[id number] ([id number]) scope was introduced through                            the anus and advanced to the the cecum, identified                            by appendiceal orifice and ileocecal valve. The                            colonoscopy was performed without difficulty. The                            patient tolerated the procedure well. The quality                            of the bowel preparation was good. The ileocecal                            valve, appendiceal orifice, and rectum were                            photographed. Scope In: 7:41:21 AM Scope Out: 7:56:16 AM Scope Withdrawal Time: 0 hours 4 minutes 54 seconds  Total Procedure Duration: 0  hours 14 minutes 55 seconds  Findings:      External and internal hemorrhoids were found. The hemorrhoids were small.      The exam was otherwise without abnormality on direct and retroflexion       views. Impression:               - External and internal hemorrhoids.                           - The examination was otherwise normal on direct                            and retroflexion views.                           - No polyps or cancers. Moderate Sedation:      N/A- Per Anesthesia Care Recommendation:           - Patient has a contact number available for                            emergencies. The signs and symptoms of potential                            delayed complications were discussed with the                            patient. Return to normal activities tomorrow.                            Written discharge instructions were provided to the                             patient.                           - Resume previous diet.                           - Continue present medications. You can resume your                            blood thinner today.                           - You do not need any further colon cancer                            screening tests (including stool testing). These                            types of tests generally stop around age 28-80. Procedure Code(s):        --- Professional ---                           [phone], Colonoscopy, flexible; diagnostic, including  collection of specimen(s) by brushing or washing,                            when performed (separate procedure) Diagnosis Code(s):        --- Professional ---                           Z86.010, Personal history of colonic polyps                           K64.8, Other hemorrhoids CPT copyright 2017 American Medical Association. All rights reserved. The codes documented in this report are preliminary and upon coder review may  be revised to meet current compliance requirements. Rachael Fee, MD 05/03/2017 8:00:10 AM This report has been signed electronically. Number of Addenda: 0

## 2017-05-03 NOTE — Anesthesia Postprocedure Evaluation (Signed)
Anesthesia Post Note  Patient: Nicholas Lynn  Procedure(s) Performed: COLONOSCOPY WITH PROPOFOL (N/A )     Patient location during evaluation: PACU Anesthesia Type: MAC Level of consciousness: awake and alert Pain management: pain level controlled Vital Signs Assessment: post-procedure vital signs reviewed and stable Respiratory status: spontaneous breathing, nonlabored ventilation, respiratory function stable and patient connected to nasal cannula oxygen Cardiovascular status: stable and blood pressure returned to baseline Postop Assessment: no apparent nausea or vomiting Anesthetic complications: no    Last Vitals:  Vitals:   05/03/17 0810 05/03/17 0820  BP: (!) 141/72 126/70  Pulse: (!) 44 (!) 51  Resp: 16 16  Temp:    SpO2: 98% 93%    Last Pain:  Vitals:   05/03/17 0820  TempSrc:   PainSc: 0-No pain                 Laurie Penado EDWARD

## 2017-05-03 NOTE — Transfer of Care (Signed)
Immediate Anesthesia Transfer of Care Note  Patient: Nicholas Lynn  Procedure(s) Performed: COLONOSCOPY WITH PROPOFOL (N/A )  Patient Location: Endoscopy Unit  Anesthesia Type:MAC  Level of Consciousness: drowsy and patient cooperative  Airway & Oxygen Therapy: Patient Spontanous Breathing and Patient connected to face mask oxygen  Post-op Assessment: Report given to RN, Post -op Vital signs reviewed and stable and Patient moving all extremities  Post vital signs: Reviewed and stable  Last Vitals:  Vitals Value Taken Time  BP 125/84 05/03/2017  8:02 AM  Temp    Pulse 50 05/03/2017  8:03 AM  Resp 21 05/03/2017  8:03 AM  SpO2 97 % 05/03/2017  8:03 AM  Vitals shown include unvalidated device data.  Last Pain:  Vitals:   05/03/17 0642  TempSrc: Oral  PainSc: 0-No pain         Complications: No apparent anesthesia complications

## 2017-05-03 NOTE — H&P (Signed)
HPI: This is a 72 yo man with   Chief complaint is h/o adenomatous polyps  ROS: complete GI ROS as described in HPI, all other review negative.  Constitutional:  No unintentional weight loss   Past Medical History:  Diagnosis Date  . A-fib (HCC)   . Arthritis   . COPD (chronic obstructive pulmonary disease) (HCC)    mild on singulair  . Coronary artery disease 1998; 1996; 2013   PTCA LCX; CABG X 4; RE-do CABG  . GERD (gastroesophageal reflux disease)   . Hernia    umbilical  . Hx of myocardial infarction 1983   medically treated   . Hyperlipidemia   . Hypertension   . OSA on CPAP     Past Surgical History:  Procedure Laterality Date  . ANGIOPLASTY  1993   LCX  . CARDIAC CATHETERIZATION  2008;2013  . CARDIOVERSION N/A 12/25/2012   Procedure: CARDIOVERSION;  Surgeon: Chrystie Nose, MD;  Location: [facility] ENDOSCOPY;  Service: Cardiovascular;  Laterality: N/A;  . CATARACT EXTRACTION    . CORONARY ARTERY BYPASS GRAFT  1996   3 VESSELS  . CORONARY ARTERY BYPASS GRAFT  09/11/2011   Procedure: REDO CORONARY ARTERY BYPASS GRAFTING (CABG);  Surgeon: Delight Ovens, MD;  Location: [facility] OR;  Service: Open Heart Surgery;  Laterality: N/A;  Redo Coronary Artery Bypass Graft times three utilizing the right internal mammary artery and the left  and right greater saphenous veins.  Marland Kitchen GRAFT(S) ANGIOGRAM  09/01/2011   Procedure: GRAFT(S) Rosalin Hawking;  Surgeon: Marykay Lex, MD;  Location: [facility] C[facility] LAB;  Service: Cardiovascular;;  . LEFT HEART CATHETERIZATION WITH CORONARY ANGIOGRAM N/A 09/01/2011   Procedure: LEFT HEART CATHETERIZATION WITH CORONARY ANGIOGRAM;  Surgeon: Marykay Lex, MD;  Location: [facility] CATH LAB;  Service: Cardiovascular;  Laterality: N/A;  . TEE WITHOUT CARDIOVERSION  09/11/2011   Procedure: TRANSESOPHAGEAL ECHOCARDIOGRAM (TEE);  Surgeon: Delight Ovens, MD;  Location: [facility] OR;  Service: Open Heart Surgery;  Laterality: N/A;  . TEE WITHOUT CARDIOVERSION N/A 12/25/2012   Procedure: TRANSESOPHAGEAL ECHOCARDIOGRAM (TEE);  Surgeon: Chrystie Nose, MD;  Location: [facility] ENDOSCOPY;  Service: Cardiovascular;  Laterality: N/A;  . UMBILICAL HERNIA REPAIR  04/04/2011   Procedure: HERNIA REPAIR UMBILICAL ADULT;  Surgeon: Adolph Pollack, MD;  Location: [facility] ORS;  Service: General;  Laterality: N/A;  Umbilical Hernia Repair    Current Facility-Administered Medications  Medication Dose Route Frequency Provider Last Rate Last Dose  . 0.9 %  sodium chloride infusion   Intravenous Continuous Esterwood, Amy S, PA-C      . lactated ringers infusion   Intravenous Continuous Rachael Fee, MD 10 mL/hr at 05/03/17 1443      Allergies as of 03/27/2017 - Review Complete 03/27/2017  Allergen Reaction Noted  . Sulfa antibiotics Swelling 12/22/2012    Family History  Problem Relation Age of Onset  . Stroke Mother   . Cancer Father        lung  . Cancer Sister        lung  . Cancer Sister        liver    Social History   Socioeconomic History  . Marital status: Married    Spouse name: Not on file  . Number of children: Not on file  . Years of education: Not on file  . Highest education level: Not on file  Occupational History  . Not on file  Social Needs  . Financial resource strain: Not on file  .  Food insecurity:    Worry: Not on file    Inability: Not on file  . Transportation needs:    Medical: Not on file    Non-medical: Not on file  Tobacco Use  . Smoking status: Former Smoker    Last attempt to quit: 03/27/1986    Years since quitting: 31.1  . Smokeless tobacco: Never Used  Substance and Sexual Activity  . Alcohol use: No  . Drug use: No  . Sexual activity: Not on file  Lifestyle  . Physical activity:    Days per week: Not on file    Minutes per session: Not on file  . Stress: Not on file  Relationships  . Social connections:    Talks on phone: Not on file    Gets together: Not on file    Attends religious service: Not on file    Active  member of club or organization: Not on file    Attends meetings of clubs or organizations: Not on file    Relationship status: Not on file  . Intimate partner violence:    Fear of current or ex partner: Not on file    Emotionally abused: Not on file    Physically abused: Not on file    Forced sexual activity: Not on file  Other Topics Concern  . Not on file  Social History Narrative   Retired Psychologist, occupational     Physical Exam: BP (!) 170/80   Pulse 64   Temp 97.8 F (36.6 C) (Oral)   Resp 17   Ht 6\' 2"  (1.88 m)   Wt 280 lb (127 kg)   BMI 35.95 kg/m  Constitutional: generally well-appearing Psychiatric: alert and oriented x3 Abdomen: soft, nontender, nondistended, no obvious ascites, no peritoneal signs, normal bowel sounds No peripheral edema noted in lower extremities  Assessment and plan: 72 y.o. male with h/o adenomatous polyps  For colonoscopy today.  Please see the "Patient Instructions" section for addition details about the plan.  Rob Bunting, MD [facility] 05/03/2017, 7:26 AM

## 2017-05-04 ENCOUNTER — Encounter (HOSPITAL_COMMUNITY): Payer: Self-pay | Admitting: Gastroenterology

## 2017-05-17 ENCOUNTER — Other Ambulatory Visit: Payer: Self-pay | Admitting: Internal Medicine

## 2017-06-06 ENCOUNTER — Ambulatory Visit: Payer: Medicare Other | Admitting: Internal Medicine

## 2017-06-06 ENCOUNTER — Other Ambulatory Visit: Payer: Self-pay | Admitting: Internal Medicine

## 2017-06-06 ENCOUNTER — Encounter: Payer: Self-pay | Admitting: Internal Medicine

## 2017-06-06 VITALS — BP 141/64 | HR 44 | Ht 74.0 in | Wt 281.2 lb

## 2017-06-06 DIAGNOSIS — E782 Mixed hyperlipidemia: Secondary | ICD-10-CM | POA: Diagnosis not present

## 2017-06-06 DIAGNOSIS — Z79899 Other long term (current) drug therapy: Secondary | ICD-10-CM | POA: Diagnosis not present

## 2017-06-06 DIAGNOSIS — I5022 Chronic systolic (congestive) heart failure: Secondary | ICD-10-CM

## 2017-06-06 DIAGNOSIS — I255 Ischemic cardiomyopathy: Secondary | ICD-10-CM

## 2017-06-06 DIAGNOSIS — I480 Paroxysmal atrial fibrillation: Secondary | ICD-10-CM | POA: Diagnosis not present

## 2017-06-06 DIAGNOSIS — I2510 Atherosclerotic heart disease of native coronary artery without angina pectoris: Secondary | ICD-10-CM

## 2017-06-06 MED ORDER — CARVEDILOL 3.125 MG PO TABS: 3.1250 mg | ORAL_TABLET | Freq: Two times a day (BID) | ORAL | 3 refills | Status: AC

## 2017-06-06 NOTE — Patient Instructions (Signed)
Medication Instructions:   STOP metoprolol  STAR carvedilol 3.125mg  twice daily  Labwork:  NONE  Testing/Procedures:  Your physician has requested that you have an echocardiogram @ [address] - [address]rd Floor. Echocardiography is a painless test that uses sound waves to create images of your heart. It provides your doctor with information about the size and shape of your heart and how well your heart's chambers and valves are working. This procedure takes approximately one hour. There are no restrictions for this procedure.  Your physician has recommended that you have a pulmonary function test @ [facility]. Pulmonary Function Tests are a group of tests that measure how well air moves in and out of your lungs.  Follow-Up:  Your physician wants you to follow-up in: ONE YEAR with Dr. Rennis Golden. You will receive a reminder letter in the mail two months in advance. If you don't receive a letter, please call our office to schedule the follow-up appointment.   If you need a refill on your cardiac medications before your next appointment, please call your pharmacy.  Any Other Special Instructions Will Be Listed Below (If Applicable).

## 2017-06-06 NOTE — Progress Notes (Signed)
Grossly normal   OFFICE NOTE  Chief Complaint:  No complaints, feels well  Primary Care Physician: Tracey Harries, MD  HPI:  Nicholas Lynn is a 72 year old male has a history of coronary artery disease with previous anterior infarction in 1988 treated with angioplasty. He had bypass grafting in 1996 and then redo bypass grafting in August of 2013. His grafts are detailed in the problem list. He had done well since bypass did have atrial fibrillation was on warfarin until January of this year as well as amiodarone. In January both amiodarone and warfarin were discontinued. He completed rehabilitation and had felt well without recurrence of arrhythmia. He was in his usual state of health but around 5 PM the day of admission after getting up from the bathroom noted the onset of rapid palpitations and irregular heartbeat. He came to the emergency room where he was found to be in rapid atrial fibrillation. He was given metoprolol as well as diltiazem with some slowing of his heart rate. He did not have any angina and denied symptoms of heart failure. He had no PND, orthopnea or significant edema. He has no significant claudication. He was admitted and started on Eliquis and amiodarone. TEE/DCCv was arranged and completed on 12/25/12. He converted without complications. 2D echo on 12/23/12 revealed and EF of 35-45% with wall motion abnormalities. Amiodarone 400mg  for seven days total then decreased to 400mg  daily. He recently saw [name], PA-C in followup and was noted to be bradycardic. His amiodarone was further decreased to 200 mg daily.  Despite his bradycardia with heart rate in the 40s, he reportedly is asymptomatic and is able to do most activities without much problem. He has noted however that his blood pressure continues to creep up somewhat and has been in the 150s or 160s systolic. At his last office visit I started him on Diovan HCTZ and he subsequently followed up with [name] and his blood  pressure was much better controlled.  He's done generally well since that time however today in followup he reports he's been having recent fevers up to 10 3 at night. This is associated with some night sweats and shaking chills. It seems to be cyclical. He was seen in urgent care workup for possible tick bites in [facility] spotted fever. This was negative. He is continue to followup with his primary care provider for additional workup as to possible causes of his constitutional symptoms.  I saw [name] back in the office today. He reports doing very well. He continues to exercise for 5 times a week. He says it difficult for him to get his heart rate much greater than 80 on the treadmill. This is likely due to his amiodarone and dose of metoprolol. Fortunately these medications are helping him maintain a sinus rhythm. He is asymptomatic with regards shortness of breath or any chest pain. Heart rate today is 48 and regular. He seems to be maintaining sinus rhythm. Blood pressure is well-controlled.  [name] returns today for follow-up. He is without complaints. Blood pressure was initially elevated at 162/60 became down to 134/68. He denies any chest pain or worsening shortness of breath. He is on amiodarone 200 mg daily in addition to metoprolol 25 mg twice a day. Heart rate is in the mid 40s. He has had a low heart rate in the 40s for several years and seems to be stable with that. He's not had any worsening chest pain, shortness of breath or decreased exercise tolerance.  In fact he feels excellent. He can exercise and gets his heart rate generally up into the 80s or 90s. For this reason I did not see a need to decrease his medicine at this time.  [name] returns today for follow-up. Again he is bradycardic in the 40s. He says he has no symptoms with this although with exercise he does say that his heart rate generally only gets up to the 70s or 80s which is even lower than it had been  previously. Blood pressure is within normal limits. EKG shows sinus bradycardia which is marked at 44 with aberrant PACs. He does report some fatigue however is not clear whether this is related to heart rate or not. He has been on amiodarone without recurrent A. fib. He is due for repeat laboratory work. Although he had recent labs from his primary care provider, he did not have a TSH which is required on amiodarone. He did have liver function tests which were normal. He will also need repeat pulmonary function tests.  09/11/2015  [name] returns today for follow-up. He denies any chest pain or worsening shortness of breath. He does not seem to be symptomatic with his bradycardia. We decreased his beta blocker further at his last visit but it does not seem to change his heart rate all. He continues to be able to exercise and is asymptomatic.  06/05/2016  [name] returns today for follow-up. He is without complaints. Overall he is unaware of any recurrent atrial fibrillation. He's been on amiodarone 200 mg daily. Today's EKG shows sinus bradycardia with a QTC of 495 ms and septal infarct pattern. He continues to be able to exercise several times a week and is asymptomatic with this. He denies bleeding problems on Eliquis.  06/06/2017  I had the pleasure of seeing [name] back today in follow-up.  He denies any chest pain or worsening shortness of breath.  He had no recurrent A. fib.  He is been on amiodarone.  Recent lab work included normal liver enzymes and a TSH of 2.01.  He is overdue for pulmonary function testing which will order as he is on amiodarone.  Lipid profile recently was total cholesterol 111, triglycerides 62, HDL 53 and LDL 46.  He denies chest pain or worsening shortness of breath.  In fact he has NYHA class I symptoms, despite LVEF of 30 to 35% last year.  He has not had reassessment of this.  He is on metoprolol tartrate as well as valsartan HCTZ.  We discussed other options  including Entresto, but he does not have NYHA class II-IV symptoms.  Ideally, it may be better for him to be on the carvedilol rather than metoprolol short acting due to lack of data.  He is bradycardic, but reports feeling worse when his heart rate is up in the 70s or 80s versus in the 40s or 50s and would prefer to have a low heart rate.  PMHx:  Past Medical History:  Diagnosis Date  . A-fib (HCC)   . Arthritis   . COPD (chronic obstructive pulmonary disease) (HCC)    mild on singulair  . Coronary artery disease 1998; 1996; 2013   PTCA LCX; CABG X 4; RE-do CABG  . GERD (gastroesophageal reflux disease)   . Hernia    umbilical  . Hx of myocardial infarction 1983   medically treated   . Hyperlipidemia   . Hypertension   . OSA on CPAP     Past Surgical  History:  Procedure Laterality Date  . ANGIOPLASTY  1993   LCX  . CARDIAC CATHETERIZATION  2008;2013  . CARDIOVERSION N/A 12/25/2012   Procedure: CARDIOVERSION;  Surgeon: Chrystie Nose, MD;  Location: [facility] ENDOSCOPY;  Service: Cardiovascular;  Laterality: N/A;  . CATARACT EXTRACTION    . COLONOSCOPY WITH PROPOFOL N/A 05/03/2017   Procedure: COLONOSCOPY WITH PROPOFOL;  Surgeon: Rachael Fee, MD;  Location: [facility] ENDOSCOPY;  Service: Endoscopy;  Laterality: N/A;  . CORONARY ARTERY BYPASS GRAFT  1996   3 VESSELS  . CORONARY ARTERY BYPASS GRAFT  09/11/2011   Procedure: REDO CORONARY ARTERY BYPASS GRAFTING (CABG);  Surgeon: Delight Ovens, MD;  Location: [facility] OR;  Service: Open Heart Surgery;  Laterality: N/A;  Redo Coronary Artery Bypass Graft times three utilizing the right internal mammary artery and the left  and right greater saphenous veins.  Marland Kitchen GRAFT(S) ANGIOGRAM  09/01/2011   Procedure: GRAFT(S) Rosalin Hawking;  Surgeon: Marykay Lex, MD;  Location: [facility] CATH LAB;  Service: Cardiovascular;;  . LEFT HEART CATHETERIZATION WITH CORONARY ANGIOGRAM N/A 09/01/2011   Procedure: LEFT HEART CATHETERIZATION WITH CORONARY ANGIOGRAM;  Surgeon: Marykay Lex, MD;  Location: [facility] CATH LAB;  Service: Cardiovascular;  Laterality: N/A;  . TEE WITHOUT CARDIOVERSION  09/11/2011   Procedure: TRANSESOPHAGEAL ECHOCARDIOGRAM (TEE);  Surgeon: Delight Ovens, MD;  Location: [facility] OR;  Service: Open Heart Surgery;  Laterality: N/A;  . TEE WITHOUT CARDIOVERSION N/A 12/25/2012   Procedure: TRANSESOPHAGEAL ECHOCARDIOGRAM (TEE);  Surgeon: Chrystie Nose, MD;  Location: [facility] ENDOSCOPY;  Service: Cardiovascular;  Laterality: N/A;  . UMBILICAL HERNIA REPAIR  04/04/2011   Procedure: HERNIA REPAIR UMBILICAL ADULT;  Surgeon: Adolph Pollack, MD;  Location: [facility] ORS;  Service: General;  Laterality: N/A;  Umbilical Hernia Repair    FAMHx:  Family History  Problem Relation Age of Onset  . Stroke Mother   . Cancer Father        lung  . Cancer Sister        lung  . Cancer Sister        liver    SOCHx:   reports that he quit smoking about 31 years ago. He has never used smokeless tobacco. He reports that he does not drink alcohol or use drugs.  ALLERGIES:  Allergies  Allergen Reactions  . Sulfa Antibiotics Swelling    ROS: A comprehensive review of systems was negative.  HOME MEDS: Current Outpatient Medications  Medication Sig Dispense Refill  . acetaminophen (TYLENOL) 500 MG tablet Take 1,000 mg by mouth every 6 (six) hours as needed (for pain.).    Marland Kitchen amiodarone (PACERONE) 200 MG tablet TAKE 1 TABLET BY MOUTH EVERY DAY 90 tablet 0  . aspirin EC 81 MG tablet Take 81 mg by mouth daily.    Marland Kitchen atorvastatin (LIPITOR) 10 MG tablet Take one tablet daily (Patient taking differently: Take 10 mg by mouth at bedtime. Take one tablet daily) 90 tablet 3  . Bioflavonoid Products (ESTER C PO) Take 500 mg by mouth daily.      . cetirizine (ZYRTEC) 10 MG tablet Take 10 mg by mouth daily.      . Cholecalciferol (D3-1000 PO) Take 1,000 Units by mouth 2 (two) times daily.    Marland Kitchen ELIQUIS 5 MG TABS tablet TAKE 1 TABLET BY MOUTH TWICE A DAY 180 tablet 1  . ezetimibe (ZETIA)  10 MG tablet Take 1 tablet (10 mg total) by mouth daily. 30 tablet 11  . fluticasone (FLONASE) 50  MCG/ACT nasal spray Place 1 spray into both nostrils every morning.   1  . folic acid (FOLVITE) 1 MG tablet TAKE 1 TABLET BY MOUTH ONCE DAILY 30 tablet 11  . metoprolol tartrate (LOPRESSOR) 25 MG tablet Take 0.5 tablets (12.5 mg total) by mouth 2 (two) times daily. 90 tablet 3  . Misc Natural Products (OSTEO BI-FLEX ADV TRIPLE ST PO) Take 1 tablet by mouth daily.     . montelukast (SINGULAIR) 10 MG tablet Take 10 mg by mouth at bedtime.     . Multiple Vitamin (MULTIVITAMIN WITH MINERALS) TABS tablet Take 1 tablet by mouth daily. Centrum Silver    . niacin (NIASPAN) 1000 MG CR tablet Take 1 tablet (1,000 mg total) by mouth at bedtime. PLEASE CONTACT OFFICE FOR ADDITIONAL REFILLS 90 tablet 0  . NITROSTAT 0.4 MG SL tablet PLACE 1 TABLET UNDER TONGUE EVERY 5 MINUTES AS NEEDED FOR CHEST PAIN (MAXIMUM OF 3 DOSES 5 MINUTES APART) 25 tablet 2  . Omega-3 1000 MG CAPS Take 1,000 mg by mouth 2 (two) times daily.    Marland Kitchen omeprazole (PRILOSEC) 20 MG capsule Take 20 mg by mouth daily before breakfast.     . POLY-IRON 150 150 MG capsule TAKE 1 CAPSULE BY MOUTH ONCE DAILY 30 capsule 11  . polyethylene glycol-electrolytes (NULYTELY/GOLYTELY) 420 g solution Take as directed for colonoscopy prep. 4000 mL 0  . valsartan-hydrochlorothiazide (DIOVAN-HCT) 160-12.5 MG tablet Take 1 tablet by mouth daily. 30 tablet 10   No current facility-administered medications for this visit.     LABS/IMAGING: No results found for this or any previous visit (from the past 48 hour(s)). No results found.  VITALS: BP (!) 141/64   Pulse (!) 44   Ht 6\' 2"  (1.88 m)   Wt 281 lb 3.2 oz (127.6 kg)   BMI 36.10 kg/m   EXAM: General appearance: alert, no distress and mildly obese Neck: no carotid bruit, no JVD and thyroid not enlarged, symmetric, no tenderness/mass/nodules Lungs: clear to auscultation bilaterally Heart: regular rate and  rhythm, S1, S2 normal, no murmur, click, rub or gallop and bradycardia Abdomen: soft, non-tender; bowel sounds normal; no masses,  no organomegaly Extremities: extremities normal, atraumatic, no cyanosis or edema Pulses: 2+ and symmetric Skin: Skin color, texture, turgor normal. No rashes or lesions Neurologic: GroPsych: Mood, affect normaly normal Psych: Mood, affect normal  EKG: Sinus bradycardia 44, anteroseptal infarct pattern-personally reviewed QTC 487 ms  ASSESSMENT: 1. Paroxysmal atrial fibrillation status post TEE/cardioversion - maintaining sinus 2. Possibly symptomatic sinus bradycardia on low-dose amiodarone and metoprolol 3. Ischemic cardiomyopathy EF 35-40% 4. Hypertension 5. CAD s/p CABG 6. Asymptomatic bradycardia 7. Anticoagulation on Eliquis - CHADSVASC 6 8. OSA on CPAP  PLAN: 1.   Mr. Gaetani continues to endorse NYHA class I symptoms with an ischemic cardiomyopathy and LVEF 35 to 40% by echo last year.  I like to repeat that study to see if there has been any improvement in LV function.  If there is no significant improvement or is worsening, despite his symptoms, we may consider Entresto.  The only caveat is he has class I symptoms.  I would recommend switching him from metoprolol to carvedilol 3.125 mill grams twice daily for better blood pressure lowering benefit and I feel that he was still have an adequate heart rate.  Follow-up annually or sooner as necessary.  Chrystie Nose, MD, [facility], FACP  [place]  [facility]  Medical Director of the Advanced Lipid Disorders &  Cardiovascular Risk  Reduction Clinic Diplomate of the American Board of Clinical Lipidology Attending Cardiologist  Direct Dial: [phone]  Fax: [phone]  Website:  [email or web address] 06/06/2017, 11:24 AM

## 2017-06-08 NOTE — Progress Notes (Signed)
No, Dr. Everlene Other is now at [facility] with [facility].

## 2017-06-19 ENCOUNTER — Ambulatory Visit (HOSPITAL_BASED_OUTPATIENT_CLINIC_OR_DEPARTMENT_OTHER): Payer: Medicare Other

## 2017-06-19 ENCOUNTER — Ambulatory Visit (HOSPITAL_COMMUNITY)
Admission: RE | Admit: 2017-06-19 | Discharge: 2017-06-19 | Disposition: A | Discharge status: Home / Self Care | Payer: Medicare Other | Source: Ambulatory Visit | Attending: Internal Medicine | Admitting: Internal Medicine

## 2017-06-19 ENCOUNTER — Other Ambulatory Visit: Payer: Self-pay

## 2017-06-19 DIAGNOSIS — I2510 Atherosclerotic heart disease of native coronary artery without angina pectoris: Secondary | ICD-10-CM | POA: Insufficient documentation

## 2017-06-19 DIAGNOSIS — G4733 Obstructive sleep apnea (adult) (pediatric): Secondary | ICD-10-CM | POA: Insufficient documentation

## 2017-06-19 DIAGNOSIS — I255 Ischemic cardiomyopathy: Secondary | ICD-10-CM

## 2017-06-19 DIAGNOSIS — J449 Chronic obstructive pulmonary disease, unspecified: Secondary | ICD-10-CM | POA: Diagnosis not present

## 2017-06-19 DIAGNOSIS — I5020 Unspecified systolic (congestive) heart failure: Secondary | ICD-10-CM | POA: Diagnosis not present

## 2017-06-19 DIAGNOSIS — I071 Rheumatic tricuspid insufficiency: Secondary | ICD-10-CM | POA: Diagnosis not present

## 2017-06-19 DIAGNOSIS — E785 Hyperlipidemia, unspecified: Secondary | ICD-10-CM | POA: Diagnosis not present

## 2017-06-19 DIAGNOSIS — Z951 Presence of aortocoronary bypass graft: Secondary | ICD-10-CM | POA: Diagnosis not present

## 2017-06-19 DIAGNOSIS — I219 Acute myocardial infarction, unspecified: Secondary | ICD-10-CM | POA: Insufficient documentation

## 2017-06-19 DIAGNOSIS — I4891 Unspecified atrial fibrillation: Secondary | ICD-10-CM | POA: Diagnosis not present

## 2017-06-19 DIAGNOSIS — I480 Paroxysmal atrial fibrillation: Secondary | ICD-10-CM

## 2017-06-19 DIAGNOSIS — Z79899 Other long term (current) drug therapy: Secondary | ICD-10-CM

## 2017-06-19 DIAGNOSIS — I119 Hypertensive heart disease without heart failure: Secondary | ICD-10-CM | POA: Diagnosis not present

## 2017-06-19 LAB — PULMONARY FUNCTION TEST
DL/VA % pred: 88 %
DL/VA: 4.19 ml/min/mmHg/L
DLCO UNC % PRED: 57 %
DLCO UNC: 21 ml/min/mmHg
FEF 25-75 PRE: 1.74 L/s
FEF 25-75 Post: 1.94 L/sec
FEF2575-%CHANGE-POST: 11 %
FEF2575-%PRED-POST: 76 %
FEF2575-%Pred-Pre: 68 %
FEV1-%CHANGE-POST: 2 %
FEV1-%PRED-POST: 67 %
FEV1-%PRED-PRE: 66 %
FEV1-POST: 2.36 L
FEV1-Pre: 2.31 L
FEV1FVC-%Change-Post: -7 %
FEV1FVC-%Pred-Pre: 100 %
FEV6-%CHANGE-POST: 10 %
FEV6-%PRED-POST: 76 %
FEV6-%PRED-PRE: 69 %
FEV6-PRE: 3.13 L
FEV6-Post: 3.46 L
FEV6FVC-%CHANGE-POST: 0 %
FEV6FVC-%PRED-PRE: 105 %
FEV6FVC-%Pred-Post: 105 %
FVC-%CHANGE-POST: 10 %
FVC-%PRED-POST: 72 %
FVC-%Pred-Pre: 66 %
FVC-Post: 3.48 L
FVC-Pre: 3.16 L
POST FEV6/FVC RATIO: 100 %
Post FEV1/FVC ratio: 68 %
Pre FEV1/FVC ratio: 73 %
Pre FEV6/FVC Ratio: 99 %
RV % PRED: 63 %
RV: 1.71 L
TLC % pred: 68 %
TLC: 5.26 L

## 2017-06-19 MED ORDER — PERFLUTREN LIPID MICROSPHERE
1.0000 mL | INTRAVENOUS | Status: AC | PRN
  Administered 2017-06-19: 2 mL via INTRAVENOUS

## 2017-06-19 MED ORDER — ALBUTEROL SULFATE (2.5 MG/3ML) 0.083% IN NEBU
2.5000 mg | INHALATION_SOLUTION | Freq: Once | RESPIRATORY_TRACT | Status: AC
  Administered 2017-06-19: 2.5 mg via RESPIRATORY_TRACT

## 2017-06-20 ENCOUNTER — Telehealth: Payer: Self-pay | Admitting: Internal Medicine

## 2017-06-20 NOTE — Telephone Encounter (Signed)
Patient called w/results (below). He agreed w/MD plan. He was advised to call office should he feel like he is in AF after stopping amiodarone. Med list updated.   Notes recorded by Chrystie Nose, MD on 06/20/2017 at 10:18 AM EDT PFT's slowly worse - I would like to stop amiodarone and see how he does without it.  Dr. Rexene Edison  Notes recorded by Chrystie Nose, MD on 06/20/2017 at 10:19 AM EDT EF improved to 40-45%.  Dr. Rexene Edison

## 2017-06-20 NOTE — Telephone Encounter (Signed)
New Message   Pt returning call for Nicholas Lynn about echo

## 2017-06-21 ENCOUNTER — Other Ambulatory Visit: Payer: Self-pay | Admitting: Internal Medicine

## 2017-06-21 NOTE — Telephone Encounter (Signed)
Rx sent to pharmacy   

## 2017-06-29 MED FILL — Perflutren Lipid Microsphere IV Susp 6.52 MG/ML: INTRAVENOUS | Qty: 2 | Status: AC

## 2017-08-02 ENCOUNTER — Other Ambulatory Visit: Payer: Self-pay | Admitting: Internal Medicine

## 2017-08-27 ENCOUNTER — Other Ambulatory Visit: Payer: Self-pay | Admitting: *Deleted

## 2017-08-27 MED ORDER — ATORVASTATIN CALCIUM 10 MG PO TABS: ORAL_TABLET | ORAL | 3 refills | Status: AC

## 2017-08-31 ENCOUNTER — Other Ambulatory Visit: Payer: Self-pay | Admitting: *Deleted

## 2017-08-31 MED ORDER — EZETIMIBE 10 MG PO TABS: 10.0000 mg | ORAL_TABLET | Freq: Every day | ORAL | 11 refills | Status: AC

## 2017-09-17 ENCOUNTER — Other Ambulatory Visit: Payer: Self-pay | Admitting: Internal Medicine

## 2017-09-20 ENCOUNTER — Other Ambulatory Visit: Payer: Self-pay | Admitting: Internal Medicine

## 2017-09-20 NOTE — Telephone Encounter (Signed)
Rx sent to pharmacy   

## 2017-09-24 ENCOUNTER — Other Ambulatory Visit: Payer: Self-pay | Admitting: Internal Medicine

## 2017-09-25 ENCOUNTER — Other Ambulatory Visit: Payer: Self-pay | Admitting: Internal Medicine

## 2017-09-26 NOTE — Telephone Encounter (Signed)
Rx(s) sent to pharmacy electronically.  

## 2017-11-15 ENCOUNTER — Other Ambulatory Visit: Payer: Self-pay | Admitting: Internal Medicine

## 2017-12-21 ENCOUNTER — Other Ambulatory Visit: Payer: Self-pay | Admitting: Internal Medicine

## 2018-01-02 ENCOUNTER — Other Ambulatory Visit: Payer: Self-pay | Admitting: Internal Medicine

## 2018-01-29 ENCOUNTER — Emergency Department (HOSPITAL_COMMUNITY): Payer: Medicare Other

## 2018-01-29 ENCOUNTER — Encounter (HOSPITAL_COMMUNITY): Payer: Self-pay | Admitting: Emergency Medicine

## 2018-01-29 ENCOUNTER — Emergency Department (HOSPITAL_COMMUNITY)
Admission: EM | Admit: 2018-01-29 | Discharge: 2018-01-29 | Disposition: A | Discharge status: Home / Self Care | Payer: Medicare Other | Attending: Emergency Medicine | Admitting: Emergency Medicine

## 2018-01-29 DIAGNOSIS — I110 Hypertensive heart disease with heart failure: Secondary | ICD-10-CM | POA: Diagnosis not present

## 2018-01-29 DIAGNOSIS — I4891 Unspecified atrial fibrillation: Secondary | ICD-10-CM

## 2018-01-29 DIAGNOSIS — Z0189 Encounter for other specified special examinations: Secondary | ICD-10-CM | POA: Insufficient documentation

## 2018-01-29 DIAGNOSIS — Z951 Presence of aortocoronary bypass graft: Secondary | ICD-10-CM | POA: Insufficient documentation

## 2018-01-29 DIAGNOSIS — Z7982 Long term (current) use of aspirin: Secondary | ICD-10-CM | POA: Insufficient documentation

## 2018-01-29 DIAGNOSIS — J449 Chronic obstructive pulmonary disease, unspecified: Secondary | ICD-10-CM | POA: Insufficient documentation

## 2018-01-29 DIAGNOSIS — Z87891 Personal history of nicotine dependence: Secondary | ICD-10-CM | POA: Insufficient documentation

## 2018-01-29 DIAGNOSIS — I2510 Atherosclerotic heart disease of native coronary artery without angina pectoris: Secondary | ICD-10-CM | POA: Insufficient documentation

## 2018-01-29 DIAGNOSIS — Z79899 Other long term (current) drug therapy: Secondary | ICD-10-CM | POA: Diagnosis not present

## 2018-01-29 DIAGNOSIS — I5022 Chronic systolic (congestive) heart failure: Secondary | ICD-10-CM | POA: Insufficient documentation

## 2018-01-29 DIAGNOSIS — Z955 Presence of coronary angioplasty implant and graft: Secondary | ICD-10-CM | POA: Diagnosis not present

## 2018-01-29 DIAGNOSIS — R002 Palpitations: Secondary | ICD-10-CM | POA: Diagnosis present

## 2018-01-29 LAB — CBC
HCT: 46.7 % (ref 39.0–52.0)
Hemoglobin: 15.4 g/dL (ref 13.0–17.0)
MCH: 30.6 pg (ref 26.0–34.0)
MCHC: 33 g/dL (ref 30.0–36.0)
MCV: 92.8 fL (ref 80.0–100.0)
Platelets: 192 10*3/uL (ref 150–400)
RBC: 5.03 MIL/uL (ref 4.22–5.81)
RDW: 13.7 % (ref 11.5–15.5)
WBC: 10.2 10*3/uL (ref 4.0–10.5)
nRBC: 0 % (ref 0.0–0.2)

## 2018-01-29 LAB — BASIC METABOLIC PANEL
Anion gap: 10 (ref 5–15)
BUN: 12 mg/dL (ref 8–23)
CHLORIDE: 107 mmol/L (ref 98–111)
CO2: 24 mmol/L (ref 22–32)
CREATININE: 1.08 mg/dL (ref 0.61–1.24)
Calcium: 9.1 mg/dL (ref 8.9–10.3)
GFR calc Af Amer: >60 mL/min (ref >60)
GFR calc non Af Amer: >60 mL/min (ref >60)
Glucose, Bld: 124 mg/dL — ABNORMAL HIGH (ref 70–99)
Potassium: 4.1 mmol/L (ref 3.5–5.1)
Sodium: 141 mmol/L (ref 135–145)

## 2018-01-29 LAB — MAGNESIUM: Magnesium: 1.8 mg/dL (ref 1.7–2.4)

## 2018-01-29 LAB — TSH: TSH: 1.199 u[IU]/mL (ref 0.350–4.500)

## 2018-01-29 LAB — TROPONIN I: Troponin I: <0.03 ng/mL (ref <0.03)

## 2018-01-29 MED ORDER — PROPOFOL 10 MG/ML IV BOLUS
INTRAVENOUS | Status: AC | PRN
  Administered 2018-01-29: 50 mg via INTRAVENOUS

## 2018-01-29 MED ORDER — PROPOFOL 10 MG/ML IV BOLUS
0.5000 mg/kg | Freq: Once | INTRAVENOUS | Status: DC
  Filled 2018-01-29: qty 20

## 2018-01-29 MED ORDER — DILTIAZEM LOAD VIA INFUSION
20.0000 mg | Freq: Once | INTRAVENOUS | Status: AC
  Administered 2018-01-29: 20 mg via INTRAVENOUS
  Filled 2018-01-29: qty 20

## 2018-01-29 MED ORDER — SODIUM CHLORIDE 0.9 % IV BOLUS
500.0000 mL | Freq: Once | INTRAVENOUS | Status: AC
  Administered 2018-01-29: 500 mL via INTRAVENOUS

## 2018-01-29 MED ORDER — DILTIAZEM HCL-DEXTROSE 100-5 MG/100ML-% IV SOLN (PREMIX)
5.0000 mg/h | INTRAVENOUS | Status: DC
  Administered 2018-01-29: 5 mg/h via INTRAVENOUS
  Filled 2018-01-29: qty 100

## 2018-01-29 NOTE — ED Notes (Addendum)
Respiratory, MD and RN at bedside. Pt placed on zoll, CO2 monitor, ambu bag at bedside.

## 2018-01-29 NOTE — ED Notes (Signed)
Obtained consent for deep sedation and cardioversion

## 2018-01-29 NOTE — ED Notes (Signed)
Pt remains in AFib, but rate has slowed to 80's.

## 2018-01-29 NOTE — ED Provider Notes (Addendum)
[facility] EMERGENCY DEPARTMENT Provider Note   CSN: [id number] Arrival date & time: 01/29/18  1612     History   Chief Complaint Chief Complaint  Patient presents with  . Atrial Fibrillation    HPI Nicholas Lynn is a 73 y.o. male.  Pt presents to the ED today with afib.   He felt himself go into it around 1500.  He was just standing from a sitting position.   He does have a hx of afib and has been on coreg.  He has been on cardizem and amiodarone in the past.  The pt is on Eliquis and has been compliant with his meds.  The pt denies sob or cp.    CHA2DS2/VAS Stroke Risk Points  Current as of 4 days ago (Friday)     4 >= 2 Points: High Risk  1 - 1.99 Points: Medium Risk  0 Points: Low Risk    The previous score was 5 on 04/22/2017.:  Last Change:     Details    This score determines the patient's risk of having a stroke if the  patient has atrial fibrillation.       Points Metrics  1 Has Congestive Heart Failure:  Yes    Current as of 4 days ago (Friday)  1 Has Vascular Disease:  Yes    Current as of 4 days ago (Friday)  1 Has Hypertension:  Yes    Current as of 4 days ago (Friday)  1 Age:  16    Current as of 4 days ago (Friday)  0 Has Diabetes:  No    Current as of 4 days ago (Friday)  0 Had Stroke:  No  Had TIA:  No  Had thromboembolism:  No    Current as of 4 days ago (Friday)  0 Male:  No    Current as of 4 days ago (Friday)              Past Medical History:  Diagnosis Date  . A-fib (HCC)   . Arthritis   . COPD (chronic obstructive pulmonary disease) (HCC)    mild on singulair  . Coronary artery disease 1998; 1996; 2013   PTCA LCX; CABG X 4; RE-do CABG  . GERD (gastroesophageal reflux disease)   . Hernia    umbilical  . Hx of myocardial infarction 1983   medically treated   . Hyperlipidemia   . Hypertension   . OSA on CPAP     Patient Active Problem List   Diagnosis Date Noted  . Chronic systolic (congestive) heart  failure (HCC) 06/06/2017  . On amiodarone therapy 06/06/2017  . Mixed hyperlipidemia 06/06/2017  . Hemorrhoids   . Encounter for colonoscopy due to history of adenomatous colonic polyps 03/27/2017  . Chest pain 03/07/2016  . Bradycardia 01/07/2013  . Chronic anticoagulation 01/01/2013  . Obesity (BMI 30-39.9)   . Ischemic cardiomyopathy, "moderate LVD" at cath 09/01/11 09/13/2011  . PAF recurrent- s/p recent DCCV on Amiodarone 12/26/12   . Coronary artery disease involving native coronary artery of native heart without angina pectoris 09/01/2011  . Dyslipidemia 09/01/2011  . OSA on CPAP 09/01/2011  . HTN (hypertension)     Past Surgical History:  Procedure Laterality Date  . ANGIOPLASTY  1993   LCX  . CARDIAC CATHETERIZATION  2008;2013  . CARDIOVERSION N/A 12/25/2012   Procedure: CARDIOVERSION;  Surgeon: Chrystie Nose, MD;  [facility];  Service: Cardiovascular;  Laterality: N/A;  .  CATARACT EXTRACTION    . COLONOSCOPY WITH PROPOFOL N/A 05/03/2017   Procedure: COLONOSCOPY WITH PROPOFOL;  Surgeon: Rachael FeeJacobs, Daniel P, MD;  Location: [facility] ENDOSCOPY;  Service: Endoscopy;  Laterality: N/A;  . CORONARY ARTERY BYPASS GRAFT  1996   3 VESSELS  . CORONARY ARTERY BYPASS GRAFT  09/11/2011   Procedure: REDO CORONARY ARTERY BYPASS GRAFTING (CABG);  Surgeon: Delight OvensEdward B Gerhardt, MD;  [facility];  Service: Open Heart Surgery;  Laterality: N/A;  Redo Coronary Artery Bypass Graft times three utilizing the right internal mammary artery and the left  and right greater saphenous veins.  Marland Kitchen. GRAFT(S) ANGIOGRAM  09/01/2011   Procedure: GRAFT(S) Rosalin HawkingANGIOGRAM;  Surgeon: Marykay Lexavid W Harding, MD;  [facility];  Service: Cardiovascular;;  . LEFT HEART CATHETERIZATION WITH CORONARY ANGIOGRAM N/A 09/01/2011   Procedure: LEFT HEART CATHETERIZATION WITH CORONARY ANGIOGRAM;  Surgeon: Marykay Lexavid W Harding, MD;  [facility];  Service: Cardiovascular;  Laterality: N/A;  . TEE WITHOUT CARDIOVERSION  09/11/2011    Procedure: TRANSESOPHAGEAL ECHOCARDIOGRAM (TEE);  Surgeon: Delight OvensEdward B Gerhardt, MD;  [facility];  Service: Open Heart Surgery;  Laterality: N/A;  . TEE WITHOUT CARDIOVERSION N/A 12/25/2012   Procedure: TRANSESOPHAGEAL ECHOCARDIOGRAM (TEE);  Surgeon: Chrystie NoseKenneth C. Hilty, MD;  [facility];  Service: Cardiovascular;  Laterality: N/A;  . UMBILICAL HERNIA REPAIR  04/04/2011   Procedure: HERNIA REPAIR UMBILICAL ADULT;  Surgeon: Adolph Pollackodd J Rosenbower, MD;  Location: [facility] ORS;  Service: General;  Laterality: N/A;  Umbilical Hernia Repair        Home Medications    Prior to Admission medications   Medication Sig Start Date End Date Taking? Authorizing Provider  acetaminophen (TYLENOL) 500 MG tablet Take 1,000 mg by mouth every 6 (six) hours as needed (for pain.).   Yes [provider]  aspirin EC 81 MG tablet Take 81 mg by mouth daily.   Yes [provider]  atorvastatin (LIPITOR) 10 MG tablet Take one tablet daily Patient taking differently: Take 10 mg by mouth daily.  08/27/17  Yes Hilty, Lisette AbuKenneth C, MD  Bioflavonoid Products (ESTER C PO) Take 500 mg by mouth daily.     Yes [provider]  carvedilol (COREG) 3.125 MG tablet Take 1 tablet (3.125 mg total) by mouth 2 (two) times daily. 06/06/17 01/29/18 Yes Hilty, Lisette AbuKenneth C, MD  cetirizine (ZYRTEC) 10 MG tablet Take 10 mg by mouth daily.     Yes [provider]  Cholecalciferol (D3-1000 PO) Take 1,000 Units by mouth 2 (two) times daily.   Yes [provider]  ELIQUIS 5 MG TABS tablet TAKE 1 TABLET BY MOUTH TWICE A DAY Patient taking differently: Take 5 mg by mouth 2 (two) times daily.  11/15/17  Yes Hilty, Lisette AbuKenneth C, MD  ezetimibe (ZETIA) 10 MG tablet Take 1 tablet (10 mg total) by mouth daily. 08/31/17  Yes Hilty, Lisette AbuKenneth C, MD  fluticasone (FLONASE) 50 MCG/ACT nasal spray Place 1 spray into both nostrils every morning.  03/02/14  Yes [provider]  folic acid (FOLVITE) 1 MG tablet TAKE 1 TABLET  BY MOUTH ONCE DAILY Patient taking differently: Take 1 mg by mouth daily.  09/17/17  Yes Hilty, Lisette AbuKenneth C, MD  Misc Natural Products (OSTEO BI-FLEX ADV TRIPLE ST PO) Take 1 tablet by mouth daily.    Yes [provider]  montelukast (SINGULAIR) 10 MG tablet Take 10 mg by mouth at bedtime.    Yes [provider]  Multiple Vitamin (MULTIVITAMIN WITH MINERALS) TABS  tablet Take 1 tablet by mouth daily. Centrum Silver   Yes [provider]  niacin (NIASPAN) 1000 MG CR tablet TAKE 1 TABLET(1000 MG) BY MOUTH AT BEDTIME Patient taking differently: Take 1,000 mg by mouth at bedtime.  12/25/17  Yes Hilty, Lisette Abu, MD  nitroGLYCERIN (NITROSTAT) 0.4 MG SL tablet PLACE ONE TABLET UNDER THE TONGUE EVERY 5 MINUTES FOR UP TO 3 DOSES IF NEEDED FOR CHEST PAIN Patient taking differently: Place 0.4 mg under the tongue every 5 (five) minutes as needed for chest pain.  06/07/17  Yes Hilty, Lisette Abu, MD  Omega-3 1000 MG CAPS Take 1,000 mg by mouth 2 (two) times daily.   Yes [provider]  omeprazole (PRILOSEC) 20 MG capsule Take 20 mg by mouth daily before breakfast.    Yes [provider]  POLY-IRON 150 150 MG capsule TAKE 1 CAPSULE BY MOUTH ONCE DAILY Patient taking differently: Take 150 mg by mouth daily.  09/25/17  Yes Hilty, Lisette Abu, MD  valsartan-hydrochlorothiazide (DIOVAN-HCT) 160-12.5 MG tablet TAKE 1 TABLET BY MOUTH DAILY 08/02/17  Yes Hilty, Lisette Abu, MD  niacin (NIASPAN) 1000 MG CR tablet TAKE 1 TABLET(1000 MG) BY MOUTH AT BEDTIME Patient not taking: Reported on 01/29/2018 01/04/18   Chrystie Nose, MD  POLY-IRON 150 150 MG capsule TAKE 1 CAPSULE BY MOUTH ONCE DAILY Patient not taking: Reported on 01/29/2018 09/26/17   Chrystie Nose, MD  polyethylene glycol-electrolytes (NULYTELY/GOLYTELY) 420 g solution Take as directed for colonoscopy prep. Patient not taking: Reported on 01/29/2018 03/27/17   Sammuel Cooper, PA-C    Family History Family History  Problem  Relation Age of Onset  . Stroke Mother   . Cancer Father        lung  . Cancer Sister        lung  . Cancer Sister        liver    Social History Social History   Tobacco Use  . Smoking status: Former Smoker    Last attempt to quit: 03/27/1986    Years since quitting: 31.8  . Smokeless tobacco: Never Used  Substance Use Topics  . Alcohol use: No  . Drug use: No     Allergies   Sulfa antibiotics   Review of Systems Review of Systems  Cardiovascular: Positive for palpitations.  All other systems reviewed and are negative.    Physical Exam Updated Vital Signs BP 116/69   Pulse 95   Temp 98 F (36.7 C) (Oral)   Resp 18   Ht 6\' 2"  (1.88 m)   Wt 108.4 kg   SpO2 97%   BMI 30.69 kg/m   Physical Exam Vitals signs and nursing note reviewed.  Constitutional:      Appearance: Normal appearance. He is normal weight.  HENT:     Head: Normocephalic and atraumatic.     Right Ear: External ear normal.     Left Ear: External ear normal.     Nose: Nose normal.     Mouth/Throat:     Mouth: Mucous membranes are moist.     Pharynx: Oropharynx is clear.  Eyes:     Extraocular Movements: Extraocular movements intact.     Pupils: Pupils are equal, round, and reactive to light.  Neck:     Musculoskeletal: Normal range of motion.  Cardiovascular:     Rate and Rhythm: Tachycardia present. Rhythm irregular.     Pulses: Normal pulses.  Pulmonary:     Effort: Pulmonary effort is  normal.     Breath sounds: Normal breath sounds.  Abdominal:     General: Abdomen is flat. Bowel sounds are normal.     Palpations: Abdomen is soft.  Musculoskeletal: Normal range of motion.  Skin:    General: Skin is warm and dry.     Capillary Refill: Capillary refill takes less than 2 seconds.  Neurological:     General: No focal deficit present.     Mental Status: He is alert and oriented to person, place, and time.  Psychiatric:        Mood and Affect: Mood normal.        Behavior:  Behavior normal.        Thought Content: Thought content normal.        Judgment: Judgment normal.      ED Treatments / Results  Labs (all labs ordered are listed, but only abnormal results are displayed) Labs Reviewed  BASIC METABOLIC PANEL - Abnormal; Notable for the following components:      Result Value   Glucose, Bld 124 (*)    All other components within normal limits  MAGNESIUM  CBC  TROPONIN I  TSH    EKG EKG Interpretation  Date/Time:  Tuesday January 29 2018 16:16:13 EST Ventricular Rate:  130 PR Interval:    QRS Duration: 137 QT Interval:  361 QTC Calculation: 531 R Axis:   -80 Text Interpretation:  Atrial fibrillation Ventricular premature complex Right bundle branch block Abnormal lateral Q waves Anterior infarct, old Confirmed by Jacalyn Lefevre [phone]) on 01/29/2018 4:18:19 PM   Radiology Dg Chest Portable 1 View  Result Date: 01/29/2018 CLINICAL DATA:  Palpitations, tachycardia, history atrial fibrillation, hypertension, coronary artery disease post CABG, COPD EXAM: PORTABLE CHEST 1 VIEW COMPARISON:  Portable exam 1654 hours compared to 03/06/2016 FINDINGS: Enlargement of cardiac silhouette post CABG. Atherosclerotic calcification aorta. Mediastinal contours and pulmonary vascularity normal. Lungs clear. No infiltrate, pleural effusion or pneumothorax. IMPRESSION: Enlargement of cardiac silhouette post CABG. No acute abnormalities. Electronically Signed   By: Ulyses Southward M.D.   On: 01/29/2018 17:09    Procedures .Cardioversion Date/Time: 01/29/2018 6:09 PM Performed by: Jacalyn Lefevre, MD Authorized by: Jacalyn Lefevre, MD   Consent:    Consent obtained:  Written   Consent given by:  Patient   Risks discussed:  Pain, induced arrhythmia and cutaneous burn   Alternatives discussed:  No treatment Pre-procedure details:    Cardioversion basis:  Emergent   Rhythm:  Atrial fibrillation   Electrode placement:  Anterior-posterior Patient sedated: Yes. Refer  to sedation procedure documentation for details of sedation.  Attempt one:    Cardioversion mode:  Synchronous   Waveform:  Biphasic   Shock (Joules):  200   Shock outcome:  Conversion to normal sinus rhythm Post-procedure details:    Patient status:  Awake   Patient tolerance of procedure:  Tolerated well, no immediate complications .Sedation Date/Time: 01/29/2018 6:10 PM Performed by: Jacalyn Lefevre, MD Authorized by: Jacalyn Lefevre, MD   Consent:    Consent obtained:  Written   Consent given by:  Patient   Risks discussed:  Allergic reaction, dysrhythmia, inadequate sedation, nausea, prolonged hypoxia resulting in organ damage, prolonged sedation necessitating reversal, respiratory compromise necessitating ventilatory assistance and intubation and vomiting   Alternatives discussed:  Analgesia without sedation, anxiolysis and regional anesthesia Universal protocol:    Procedure explained and questions answered to patient or proxy's satisfaction: yes     Relevant documents present and verified: yes  Test results available and properly labeled: yes     Imaging studies available: yes     Required blood products, implants, devices, and special equipment available: yes     Site/side marked: yes     Immediately prior to procedure a time out was called: yes     Patient identity confirmation method:  Verbally with patient Indications:    Procedure performed:  Cardioversion   Procedure necessitating sedation performed by:  Physician performing sedation Pre-sedation assessment:    Time since last food or drink:  5   ASA classification: class 2 - patient with mild systemic disease     Neck mobility: normal     Mouth opening:  3 or more finger widths   Thyromental distance:  4 finger widths   Mallampati score:  I - soft palate, uvula, fauces, pillars visible   Pre-sedation assessments completed and reviewed: airway patency, cardiovascular function, hydration status, mental status,  nausea/vomiting, pain level, respiratory function and temperature   Immediate pre-procedure details:    Reassessment: Patient reassessed immediately prior to procedure     Reviewed: vital signs, relevant labs/tests and NPO status     Verified: bag valve mask available, emergency equipment available, intubation equipment available, IV patency confirmed, oxygen available and suction available   Procedure details (see MAR for exact dosages):    Preoxygenation:  Nasal cannula   Sedation:  Propofol   Intra-procedure monitoring:  Blood pressure monitoring, cardiac monitor, continuous pulse oximetry, frequent LOC assessments, frequent vital sign checks and continuous capnometry   Intra-procedure events: none     Total Provider sedation time (minutes):  30 Post-procedure details:    Post-sedation assessment completed:  01/29/2018 6:10 PM   Attendance: Constant attendance by certified staff until patient recovered     Recovery: Patient returned to pre-procedure baseline     Post-sedation assessments completed and reviewed: airway patency, cardiovascular function, hydration status, mental status, nausea/vomiting, pain level, respiratory function and temperature     Patient is stable for discharge or admission: yes     Patient tolerance:  Tolerated well, no immediate complications   (including critical care time)  Medications Ordered in ED Medications  diltiazem (CARDIZEM) 1 mg/mL load via infusion 20 mg (20 mg Intravenous Bolus from Bag 01/29/18 1709)    And  diltiazem (CARDIZEM) 100 mg in dextrose 5% (1 mg/mL) infusion (0 mg/hr Intravenous Stopped 01/29/18 1744)  propofol (DIPRIVAN) 10 mg/mL bolus/IV push 54.2 mg (54.2 mg Intravenous Not Given 01/29/18 1805)  propofol (DIPRIVAN) 10 mg/mL bolus/IV push (50 mg Intravenous Given 01/29/18 1759)     Initial Impression / Assessment and Plan / ED Course  I have reviewed the triage vital signs and the nursing notes.  Pertinent labs & imaging results  that were available during my care of the patient were reviewed by me and considered in my medical decision making (see chart for details).    Cardizem has helped with the HR, but he is still in afib.  The pt was d/w Dr. Rennis Golden who recommended cardioversion.  Cardioversion done with propofol and 200J which converted pt to NSR.  Pt is now awake and alert.    Pt to f/u with Dr. Rennis Golden.  Final Clinical Impressions(s) / ED Diagnoses   Final diagnoses:  Atrial fibrillation with RVR (HCC)  Encounter for cardioversion procedure    ED Discharge Orders    None       Jacalyn Lefevre, MD 01/29/18 Lauretta Chester    Jacalyn Lefevre, MD 01/29/18  1841  

## 2018-01-29 NOTE — ED Triage Notes (Signed)
Pt coming from home, felt like he was in Afib (unable to fully describe how). This started around 1505. EMS arrived, pt HR 110-170 Afib on the monitor. Pt has hx of Afib. Pt denies CP, SOB, N/V, syncope. BP 170/110

## 2018-01-29 NOTE — ED Notes (Addendum)
Rn called for Cardiazem from main pharmacy. Still waiting on medication. Portable Xray at bedside

## 2018-01-29 NOTE — Sedation Documentation (Signed)
Shock delivered at 200 jules.

## 2018-01-31 ENCOUNTER — Ambulatory Visit: Payer: Medicare Other | Admitting: Physician Assistant

## 2018-02-05 ENCOUNTER — Ambulatory Visit: Payer: Medicare Other | Admitting: Internal Medicine

## 2018-02-05 ENCOUNTER — Encounter: Payer: Self-pay | Admitting: Internal Medicine

## 2018-02-05 VITALS — BP 142/84 | HR 65 | Ht 74.5 in | Wt 279.0 lb

## 2018-02-05 DIAGNOSIS — Z9989 Dependence on other enabling machines and devices: Secondary | ICD-10-CM

## 2018-02-05 DIAGNOSIS — I480 Paroxysmal atrial fibrillation: Secondary | ICD-10-CM

## 2018-02-05 DIAGNOSIS — I255 Ischemic cardiomyopathy: Secondary | ICD-10-CM | POA: Diagnosis not present

## 2018-02-05 DIAGNOSIS — G4733 Obstructive sleep apnea (adult) (pediatric): Secondary | ICD-10-CM

## 2018-02-05 DIAGNOSIS — I5022 Chronic systolic (congestive) heart failure: Secondary | ICD-10-CM | POA: Diagnosis not present

## 2018-02-05 DIAGNOSIS — I10 Essential (primary) hypertension: Secondary | ICD-10-CM

## 2018-02-05 NOTE — Patient Instructions (Signed)
Medication Instructions:  Continue current medications If you need a refill on your cardiac medications before your next appointment, please call your pharmacy.   Follow-Up: At [facility], you and your health needs are our priority.  As part of our continuing mission to provide you with exceptional heart care, we have created designated Provider Care Teams.  These Care Teams include your primary Cardiologist (physician) and Advanced Practice Providers (APPs -  Physician Assistants and Nurse Practitioners) who all work together to provide you with the care you need, when you need it. You will need a follow up appointment in 6 months.  Please call our office 2 months in advance to schedule this appointment.  You may see Dr. Rennis Golden or one of the following Advanced Practice Providers on your designated Care Team: Azalee Course, [place] . Micah Flesher, PA-C  Any Other Special Instructions Will Be Listed Below (If Applicable).  You have been referred to Dr. Fayrene Fearing Allred ([address] - [address] Floor) to evaluate for atrial fibrillation ablation

## 2018-02-05 NOTE — Progress Notes (Signed)
OFFICE NOTE  Chief Complaint:  Follow-up ER visit for A. fib  Primary Care Physician: Tracey HarriesBouska, David, MD  HPI:  Nicholas Lynn is a 73 year old male has a history of coronary artery disease with previous anterior infarction in 1988 treated with angioplasty. He had bypass grafting in 1996 and then redo bypass grafting in August of 2013. His grafts are detailed in the problem list. He had done well since bypass did have atrial fibrillation was on warfarin until January of this year as well as amiodarone. In January both amiodarone and warfarin were discontinued. He completed rehabilitation and had felt well without recurrence of arrhythmia. He was in his usual state of health but around 5 PM the day of admission after getting up from the bathroom noted the onset of rapid palpitations and irregular heartbeat. He came to the emergency room where he was found to be in rapid atrial fibrillation. He was given metoprolol as well as diltiazem with some slowing of his heart rate. He did not have any angina and denied symptoms of heart failure. He had no PND, orthopnea or significant edema. He has no significant claudication. He was admitted and started on Eliquis and amiodarone. TEE/DCCv was arranged and completed on 12/25/12. He converted without complications. 2D echo on 12/23/12 revealed and EF of 35-45% with wall motion abnormalities. Amiodarone 400mg  for seven days total then decreased to 400mg  daily. He recently saw Wilburt FinlayBryan Hager, PA-C in followup and was noted to be bradycardic. His amiodarone was further decreased to 200 mg daily.  Despite his bradycardia with heart rate in the 40s, he reportedly is asymptomatic and is able to do most activities without much problem. He has noted however that his blood pressure continues to creep up somewhat and has been in the 150s or 160s systolic. At his last office visit I started him on Diovan HCTZ and he subsequently followed up with Arlys JohnBrian and his blood pressure was  much better controlled.  He's done generally well since that time however today in followup he reports he's been having recent fevers up to 10 3 at night. This is associated with some night sweats and shaking chills. It seems to be cyclical. He was seen in urgent care workup [facility] [place] [facility]. This was negative. He is continue to followup with his primary care provider for additional workup as to possible causes of his constitutional symptoms.  I saw Nicholas Lynn back in the office today. He reports doing very well. He continues to exercise for 5 times a week. He says it difficult for him to get his heart rate much greater than 80 on the treadmill. This is likely due to his amiodarone and dose of metoprolol. Fortunately these medications are helping him maintain a sinus rhythm. He is asymptomatic with regards shortness of breath or any chest pain. Heart rate today is 48 and regular. He seems to be maintaining sinus rhythm. Blood pressure is well-controlled.  Nicholas Lynn returns today for follow-up. He is without complaints. Blood pressure was initially elevated at 162/60 became down to 134/68. He denies any chest pain or worsening shortness of breath. He is on amiodarone 200 mg daily in addition to metoprolol 25 mg twice a day. Heart rate is in the mid 40s. He has had a low heart rate in the 40s for several years and seems to be stable with that. He's not had any worsening chest pain, shortness of breath or decreased exercise tolerance.  In fact he feels excellent. He can exercise and gets his heart rate generally up into the 80s or 90s. For this reason I did not see a need to decrease his medicine at this time.  [name] returns today for follow-up. Again he is bradycardic in the 40s. He says he has no symptoms with this although with exercise he does say that his heart rate generally only gets up to the 70s or 80s which is even lower than it had been previously. Blood  pressure is within normal limits. EKG shows sinus bradycardia which is marked at 44 with aberrant PACs. He does report some fatigue however is not clear whether this is related to heart rate or not. He has been on amiodarone without recurrent A. fib. He is due for repeat laboratory work. Although he had recent labs from his primary care provider, he did not have a TSH which is required on amiodarone. He did have liver function tests which were normal. He will also need repeat pulmonary function tests.  09/11/2015  Nicholas Lynn returns today for follow-up. He denies any chest pain or worsening shortness of breath. He does not seem to be symptomatic with his bradycardia. We decreased his beta blocker further at his last visit but it does not seem to change his heart rate all. He continues to be able to exercise and is asymptomatic.  06/05/2016  Nicholas Lynn returns today for follow-up. He is without complaints. Overall he is unaware of any recurrent atrial fibrillation. He's been on amiodarone 200 mg daily. Today's EKG shows sinus bradycardia with a QTC of 495 ms and septal infarct pattern. He continues to be able to exercise several times a week and is asymptomatic with this. He denies bleeding problems on Eliquis.  06/06/2017  I had the pleasure of seeing Nicholas Lynn back today in follow-up.  He denies any chest pain or worsening shortness of breath.  He had no recurrent A. fib.  He is been on amiodarone.  Recent lab work included normal liver enzymes and a TSH of 2.01.  He is overdue for pulmonary function testing which will order as he is on amiodarone.  Lipid profile recently was total cholesterol 111, triglycerides 62, HDL 53 and LDL 46.  He denies chest pain or worsening shortness of breath.  In fact he has NYHA class I symptoms, despite LVEF of 30 to 35% last year.  He has not had reassessment of this.  He is on metoprolol tartrate as well as valsartan HCTZ.  We discussed other options including Entresto,  but he does not have NYHA class II-IV symptoms.  Ideally, it may be better for him to be on the carvedilol rather than metoprolol short acting due to lack of data.  He is bradycardic, but reports feeling worse when his heart rate is up in the 70s or 80s versus in the 40s or 50s and would prefer to have a low heart rate.  02/05/2018  Nicholas Lynn is seen today in follow-up of a recent ER visit last week for A. fib.  He was not particularly exerting himself but felt the onset of hot flushed feeling and noted that he was in A. fib with RVR.  He called 911 and presented to the emergency department.  We were notified of the ER visit and directed early cardioversion.  He was successfully cardioverted and felt much better.  He returns today and is noted to be in sinus rhythm.  Of note I discontinued his  amiodarone about 8 months ago due to worsening PFTs.  He had been on that since 2014.  He does have heart failure however his LVEF has improved slightly up to 40 to 45% but had previously been around 30 to 35%.  PMHx:  Past Medical History:  Diagnosis Date  . A-fib (HCC)   . Arthritis   . COPD (chronic obstructive pulmonary disease) (HCC)    mild on singulair  . Coronary artery disease 1998; 1996; 2013   PTCA LCX; CABG X 4; RE-do CABG  . GERD (gastroesophageal reflux disease)   . Hernia    umbilical  . Hx of myocardial infarction 1983   medically treated   . Hyperlipidemia   . Hypertension   . OSA on CPAP     Past Surgical History:  Procedure Laterality Date  . ANGIOPLASTY  1993   LCX  . CARDIAC CATHETERIZATION  2008;2013  . CARDIOVERSION N/A 12/25/2012   Procedure: CARDIOVERSION;  Surgeon: Chrystie Nose, MD;  Location: [facility] ENDOSCOPY;  Service: Cardiovascular;  Laterality: N/A;  . CATARACT EXTRACTION    . COLONOSCOPY WITH PROPOFOL N/A 05/03/2017   Procedure: COLONOSCOPY WITH PROPOFOL;  Surgeon: Rachael Fee, MD;  Location: [facility] ENDOSCOPY;  Service: Endoscopy;  Laterality: N/A;  . CORONARY  ARTERY BYPASS GRAFT  1996   3 VESSELS  . CORONARY ARTERY BYPASS GRAFT  09/11/2011   Procedure: REDO CORONARY ARTERY BYPASS GRAFTING (CABG);  Surgeon: Delight Ovens, MD;  Location: [facility] OR;  Service: Open Heart Surgery;  Laterality: N/A;  Redo Coronary Artery Bypass Graft times three utilizing the right internal mammary artery and the left  and right greater saphenous veins.  Marland Kitchen GRAFT(S) ANGIOGRAM  09/01/2011   Procedure: GRAFT(S) Rosalin Hawking;  Surgeon: Marykay Lex, MD;  Location: [facility] CATH LAB;  Service: Cardiovascular;;  . LEFT HEART CATHETERIZATION WITH CORONARY ANGIOGRAM N/A 09/01/2011   Procedure: LEFT HEART CATHETERIZATION WITH CORONARY ANGIOGRAM;  Surgeon: Marykay Lex, MD;  Location[facility] C[facility] LAB;  Service: Cardiovascular;  Laterality: N/A;  . TEE WITHOUT CARDIOVERSION  09/11/2011   Procedure: TRANSESOPHAGEAL ECHOCARDIOGRAM (TEE);  Surgeon: Delight Ovens, MD;  Location: [facility] OR;  Service: Open Heart Surgery;  Laterality: N/A;  . TEE WITHOUT CARDIOVERSION N/A 12/25/2012   Procedure: TRANSESOPHAGEAL ECHOCARDIOGRAM (TEE);  Surgeon: Chrystie Nose, MD;  Location: [facility] ENDOSCOPY;  Service: Cardiovascular;  Laterality: N/A;  . UMBILICAL HERNIA REPAIR  04/04/2011   Procedure: HERNIA REPAIR UMBILICAL ADULT;  Surgeon: Adolph Pollack, MD;  Location: [facility] ORS;  Service: General;  Laterality: N/A;  Umbilical Hernia Repair    FAMHx:  Family History  Problem Relation Age of Onset  . Stroke Mother   . Cancer Father        lung  . Cancer Sister        lung  . Cancer Sister        liver    SOCHx:   reports that he quit smoking about 31 years ago. He has never used smokeless tobacco. He reports that he does not drink alcohol or use drugs.  ALLERGIES:  Allergies  Allergen Reactions  . Sulfa Antibiotics Swelling    ROS: Pertinent items noted in HPI and remainder of comprehensive ROS otherwise negative.  HOME MEDS: Current Outpatient Medications  Medication Sig Dispense Refill  .  acetaminophen (TYLENOL) 500 MG tablet Take 1,000 mg by mouth every 6 (six) hours as needed (for pain.).    Marland Kitchen aspirin EC 81 MG tablet Take 81 mg by  mouth daily.    Marland Kitchen atorvastatin (LIPITOR) 10 MG tablet Take one tablet daily (Patient taking differently: Take 10 mg by mouth daily. ) 90 tablet 3  . Bioflavonoid Products (ESTER C PO) Take 500 mg by mouth daily.      . cetirizine (ZYRTEC) 10 MG tablet Take 10 mg by mouth daily.      . Cholecalciferol (D3-1000 PO) Take 1,000 Units by mouth 2 (two) times daily.    Marland Kitchen ELIQUIS 5 MG TABS tablet TAKE 1 TABLET BY MOUTH TWICE A DAY (Patient taking differently: Take 5 mg by mouth 2 (two) times daily. ) 180 tablet 1  . ezetimibe (ZETIA) 10 MG tablet Take 1 tablet (10 mg total) by mouth daily. 30 tablet 11  . fluticasone (FLONASE) 50 MCG/ACT nasal spray Place 1 spray into both nostrils every morning.   1  . folic acid (FOLVITE) 1 MG tablet TAKE 1 TABLET BY MOUTH ONCE DAILY (Patient taking differently: Take 1 mg by mouth daily. ) 30 tablet 9  . Misc Natural Products (OSTEO BI-FLEX ADV TRIPLE ST PO) Take 1 tablet by mouth daily.     . montelukast (SINGULAIR) 10 MG tablet Take 10 mg by mouth at bedtime.     . Multiple Vitamin (MULTIVITAMIN WITH MINERALS) TABS tablet Take 1 tablet by mouth daily. Centrum Silver    . niacin (NIASPAN) 1000 MG CR tablet TAKE 1 TABLET(1000 MG) BY MOUTH AT BEDTIME (Patient taking differently: Take 1,000 mg by mouth at bedtime. ) 90 tablet 2  . niacin (NIASPAN) 1000 MG CR tablet TAKE 1 TABLET(1000 MG) BY MOUTH AT BEDTIME 90 tablet 3  . nitroGLYCERIN (NITROSTAT) 0.4 MG SL tablet PLACE ONE TABLET UNDER THE TONGUE EVERY 5 MINUTES FOR UP TO 3 DOSES IF NEEDED FOR CHEST PAIN (Patient taking differently: Place 0.4 mg under the tongue every 5 (five) minutes as needed for chest pain. ) 25 tablet 3  . Omega-3 1000 MG CAPS Take 1,000 mg by mouth 2 (two) times daily.    Marland Kitchen omeprazole (PRILOSEC) 20 MG capsule Take 20 mg by mouth daily before breakfast.       . POLY-IRON 150 150 MG capsule TAKE 1 CAPSULE BY MOUTH ONCE DAILY (Patient taking differently: Take 150 mg by mouth daily. ) 30 capsule 0  . POLY-IRON 150 150 MG capsule TAKE 1 CAPSULE BY MOUTH ONCE DAILY 30 capsule 8  . polyethylene glycol-electrolytes (NULYTELY/GOLYTELY) 420 g solution Take as directed for colonoscopy prep. 4000 mL 0  . valsartan-hydrochlorothiazide (DIOVAN-HCT) 160-12.5 MG tablet TAKE 1 TABLET BY MOUTH DAILY 90 tablet 3  . carvedilol (COREG) 3.125 MG tablet Take 1 tablet (3.125 mg total) by mouth 2 (two) times daily. 180 tablet 3   No current facility-administered medications for this visit.     LABS/IMAGING: No results found for this or any previous visit (from the past 48 hour(s)). No results found.  VITALS: BP (!) 142/84 (BP Location: Left Arm, Patient Position: Sitting, Cuff Size: Large)   Pulse 65   Ht 6' 2.5" (1.892 m)   Wt 279 lb (126.6 kg)   BMI 35.34 kg/m   EXAM: General appearance: alert, no distress and mildly obese Neck: no carotid bruit, no JVD and thyroid not enlarged, symmetric, no tenderness/mass/nodules Lungs: clear to auscultation bilaterally Heart: regular rate and rhythm, S1, S2 normal, no murmur, click, rub or gallop and bradycardia Abdomen: soft, non-tender; bowel sounds normal; no masses,  no organomegaly Extremities: extremities normal, atraumatic, no cyanosis or edema Pulses: 2+  and symmetric Skin: Skin color, texture, turgor normal. No rashes or lesions Neurologic: GroPsych: Mood, affect normaly normal Psych: Mood, affect normal  EKG: Sinus rhythm with PVCs at 63-personally reviewed  ASSESSMENT: 1. Paroxysmal atrial fibrillation s/p DCCV (12/2017) 2. Possibly symptomatic sinus bradycardia on low-dose amiodarone and metoprolol 3. Ischemic cardiomyopathy EF 40-45% (05/2017) - NYHA Class I symptoms 4. Hypertension 5. CAD s/p CABG 6. Asymptomatic bradycardia 7. Anticoagulation on Eliquis - CHADSVASC 6 8. OSA on CPAP  PLAN: 1.    Nicholas Lynn had recurrent A. fib and was successfully cardioverted in the ER.  He had been free of A. fib for 6 or 7 years on amiodarone but was recently taken off of it by me due to worsening PFTs.  Given his cardiomyopathy, I think he may be a good candidate for catheter ablation at this point.  Alternatively, he may be medically managed with either dofetilide or sotalol.  I would like for him to have A. fib ablation evaluation and will refer him to Dr. Johney FrameAllred.  Plan follow-up with me afterwards in 6 months.  Chrystie NoseKenneth C. Hilty, MD, Orange Asc LtdFACC, FACP   [place]  [facility]  Medical Director of the [facility]  [facility] Diplomate of the American Board of Clinical Lipidology Attending Cardiologist  Direct Dial: [phone]  Fax: [phone]  Website:  [email or web address]  Kenneth C Hilty 02/05/2018, 3:08 PM

## 2018-02-11 ENCOUNTER — Encounter (HOSPITAL_COMMUNITY): Payer: Self-pay | Admitting: *Deleted

## 2018-02-11 ENCOUNTER — Other Ambulatory Visit: Payer: Self-pay

## 2018-02-11 ENCOUNTER — Emergency Department (HOSPITAL_COMMUNITY)
Admission: EM | Admit: 2018-02-11 | Discharge: 2018-02-11 | Disposition: A | Discharge status: Home / Self Care | Payer: Medicare Other | Attending: Emergency Medicine | Admitting: Emergency Medicine

## 2018-02-11 ENCOUNTER — Emergency Department (HOSPITAL_COMMUNITY): Payer: Medicare Other

## 2018-02-11 DIAGNOSIS — J449 Chronic obstructive pulmonary disease, unspecified: Secondary | ICD-10-CM | POA: Insufficient documentation

## 2018-02-11 DIAGNOSIS — I4891 Unspecified atrial fibrillation: Secondary | ICD-10-CM | POA: Diagnosis present

## 2018-02-11 DIAGNOSIS — Z79899 Other long term (current) drug therapy: Secondary | ICD-10-CM | POA: Diagnosis not present

## 2018-02-11 DIAGNOSIS — Z87891 Personal history of nicotine dependence: Secondary | ICD-10-CM | POA: Insufficient documentation

## 2018-02-11 DIAGNOSIS — I110 Hypertensive heart disease with heart failure: Secondary | ICD-10-CM | POA: Diagnosis not present

## 2018-02-11 DIAGNOSIS — I5022 Chronic systolic (congestive) heart failure: Secondary | ICD-10-CM | POA: Insufficient documentation

## 2018-02-11 DIAGNOSIS — Z7982 Long term (current) use of aspirin: Secondary | ICD-10-CM | POA: Insufficient documentation

## 2018-02-11 DIAGNOSIS — I480 Paroxysmal atrial fibrillation: Secondary | ICD-10-CM

## 2018-02-11 LAB — I-STAT TROPONIN, ED: Troponin i, poc: 0.01 ng/mL (ref 0.00–0.08)

## 2018-02-11 LAB — CBC
HCT: 47.9 % (ref 39.0–52.0)
HEMOGLOBIN: 15.3 g/dL (ref 13.0–17.0)
MCH: 29.7 pg (ref 26.0–34.0)
MCHC: 31.9 g/dL (ref 30.0–36.0)
MCV: 92.8 fL (ref 80.0–100.0)
Platelets: 183 10*3/uL (ref 150–400)
RBC: 5.16 MIL/uL (ref 4.22–5.81)
RDW: 13.6 % (ref 11.5–15.5)
WBC: 8.1 10*3/uL (ref 4.0–10.5)
nRBC: 0 % (ref 0.0–0.2)

## 2018-02-11 LAB — BASIC METABOLIC PANEL
Anion gap: 9 (ref 5–15)
BUN: 11 mg/dL (ref 8–23)
CALCIUM: 9 mg/dL (ref 8.9–10.3)
CO2: 23 mmol/L (ref 22–32)
Chloride: 106 mmol/L (ref 98–111)
Creatinine, Ser: 1.11 mg/dL (ref 0.61–1.24)
GFR calc Af Amer: >60 mL/min (ref >60)
GFR calc non Af Amer: >60 mL/min (ref >60)
Glucose, Bld: 120 mg/dL — ABNORMAL HIGH (ref 70–99)
Potassium: 4.3 mmol/L (ref 3.5–5.1)
Sodium: 138 mmol/L (ref 135–145)

## 2018-02-11 MED ORDER — PROPOFOL 10 MG/ML IV BOLUS
0.5000 mg/kg | Freq: Once | INTRAVENOUS | Status: DC
  Filled 2018-02-11: qty 20

## 2018-02-11 MED ORDER — SODIUM CHLORIDE 0.9% FLUSH: 3.0000 mL | Freq: Once | INTRAVENOUS | Status: DC

## 2018-02-11 MED ORDER — SODIUM CHLORIDE 0.9 % IV BOLUS
500.0000 mL | Freq: Once | INTRAVENOUS | Status: AC
  Administered 2018-02-11: 500 mL via INTRAVENOUS

## 2018-02-11 MED ORDER — PROPOFOL 10 MG/ML IV BOLUS
INTRAVENOUS | Status: AC | PRN
  Administered 2018-02-11: 60 mg via INTRAVENOUS

## 2018-02-11 NOTE — Progress Notes (Signed)
RT at bedside for a conscious sedation procedure. Pt placed on Quentin with ETCO2 monitoring and airway equipment standing by.  Pt tolerated well.  RN at bedside following procedure.

## 2018-02-11 NOTE — ED Triage Notes (Signed)
Pt in from home c/o irregular hr, pt hx of the same x 2 wks requiring cardioversion here, pt reports irregular hr today while walking in house, pt denies CP, SOB, n/v/d, pt hr 130-170 upon EMS arrival, pt rcvd 5 mg Metoprolol and 400 mL NS pta, HR 100-130 post tx, pt A&O x4

## 2018-02-11 NOTE — ED Provider Notes (Signed)
[facility] EMERGENCY DEPARTMENT Provider Note   CSN: [id number] Arrival date & time: 02/11/18  1353     History   Chief Complaint Chief Complaint  Patient presents with  . Irregular Heart Beat    HPI Nicholas Lynn is a 73 y.o. male.  HPI  73 year old male presents with recurrent A. fib.  He states about an hour prior to arrival he all of a sudden started feeling flushed and took his pulse and it was starting to become irregular and fast.  It is very similar to when he went into A. fib on 1/4.  At that time he was cardioverted.  He states he otherwise has not been having chest pain, shortness of breath, or feeling palpitations.  He is currently asymptomatic.  He has been taking his Eliquis as prescribed.  No leg swelling.  Past Medical History:  Diagnosis Date  . A-fib (HCC)   . Arthritis   . COPD (chronic obstructive pulmonary disease) (HCC)    mild on singulair  . Coronary artery disease 1998; 1996; 2013   PTCA LCX; CABG X 4; RE-do CABG  . GERD (gastroesophageal reflux disease)   . Hernia    umbilical  . Hx of myocardial infarction 1983   medically treated   . Hyperlipidemia   . Hypertension   . OSA on CPAP     Patient Active Problem List   Diagnosis Date Noted  . Chronic systolic (congestive) heart failure (HCC) 06/06/2017  . On amiodarone therapy 06/06/2017  . Mixed hyperlipidemia 06/06/2017  . Hemorrhoids   . Encounter for colonoscopy due to history of adenomatous colonic polyps 03/27/2017  . Chest pain 03/07/2016  . Bradycardia 01/07/2013  . Chronic anticoagulation 01/01/2013  . Obesity (BMI 30-39.9)   . Ischemic cardiomyopathy, "moderate LVD" at cath 09/01/11 09/13/2011  . PAF recurrent- s/p recent DCCV on Amiodarone 12/26/12   . Coronary artery disease involving native coronary artery of native heart without angina pectoris 09/01/2011  . Dyslipidemia 09/01/2011  . OSA on CPAP 09/01/2011  . HTN (hypertension)     Past Surgical  History:  Procedure Laterality Date  . ANGIOPLASTY  1993   LCX  . CARDIAC CATHETERIZATION  2008;2013  . CARDIOVERSION N/A 12/25/2012   Procedure: CARDIOVERSION;  Surgeon: Chrystie Nose, MD;  Location: [facility] ENDOSCOPY;  Service: Cardiovascular;  Laterality: N/A;  . CATARACT EXTRACTION    . COLONOSCOPY WITH PROPOFOL N/A 05/03/2017   Procedure: COLONOSCOPY WITH PROPOFOL;  Surgeon: Rachael Fee, MD;  Location: [facility] ENDOSCOPY;  Service: Endoscopy;  Laterality: N/A;  . CORONARY ARTERY BYPASS GRAFT  1996   3 VESSELS  . CORONARY ARTERY BYPASS GRAFT  09/11/2011   Procedure: REDO CORONARY ARTERY BYPASS GRAFTING (CABG);  Surgeon: Delight Ovens, MD;  Location: [facility] OR;  Service: Open Heart Surgery;  Laterality: N/A;  Redo Coronary Artery Bypass Graft times three utilizing the right internal mammary artery and the left  and right greater saphenous veins.  Marland Kitchen GRAFT(S) ANGIOGRAM  09/01/2011   Procedure: GRAFT(S) Rosalin Hawking;  Surgeon: Marykay Lex, MD;  Location: [facility] CATH LAB;  Service: Cardiovascular;;  . LEFT HEART CATHETERIZATION WITH CORONARY ANGIOGRAM N/A 09/01/2011   Procedure: LEFT HEART CATHETERIZATION WITH CORONARY ANGIOGRAM;  Surgeon: Marykay Lex, MD;  Location: [facility] CATH LAB;  Service: Cardiovascular;  Laterality: N/A;  . TEE WITHOUT CARDIOVERSION  09/11/2011   Procedure: TRANSESOPHAGEAL ECHOCARDIOGRAM (TEE);  Surgeon: Delight Ovens, MD;  Location: [facility] OR;  Service: Open Heart Surgery;  Laterality: N/A;  . TEE WITHOUT CARDIOVERSION N/A 12/25/2012   Procedure: TRANSESOPHAGEAL ECHOCARDIOGRAM (TEE);  Surgeon: Chrystie NoseKenneth C. Hilty, MD;  [facility];  Service: Cardiovascular;  Laterality: N/A;  . UMBILICAL HERNIA REPAIR  04/04/2011   Procedure: HERNIA REPAIR UMBILICAL ADULT;  Surgeon: Adolph Pollackodd J Rosenbower, MD;  Location: [facility] ORS;  Service: General;  Laterality: N/A;  Umbilical Hernia Repair        Home Medications    Prior to Admission medications   Medication Sig Start Date End Date Taking?  Authorizing Provider  acetaminophen (TYLENOL) 500 MG tablet Take 1,000 mg by mouth every 6 (six) hours as needed (for pain.).    [provider]  aspirin EC 81 MG tablet Take 81 mg by mouth daily.    [provider]  atorvastatin (LIPITOR) 10 MG tablet Take one tablet daily Patient taking differently: Take 10 mg by mouth daily.  08/27/17   Hilty, Lisette AbuKenneth C, MD  Bioflavonoid Products (ESTER C PO) Take 500 mg by mouth daily.      [provider]  carvedilol (COREG) 3.125 MG tablet Take 1 tablet (3.125 mg total) by mouth 2 (two) times daily. 06/06/17 01/29/18  Chrystie NoseHilty, Kenneth C, MD  cetirizine (ZYRTEC) 10 MG tablet Take 10 mg by mouth daily.      [provider]  Cholecalciferol (D3-1000 PO) Take 1,000 Units by mouth 2 (two) times daily.    [provider]  ELIQUIS 5 MG TABS tablet TAKE 1 TABLET BY MOUTH TWICE A DAY Patient taking differently: Take 5 mg by mouth 2 (two) times daily.  11/15/17   Hilty, Lisette AbuKenneth C, MD  ezetimibe (ZETIA) 10 MG tablet Take 1 tablet (10 mg total) by mouth daily. 08/31/17   Hilty, Lisette AbuKenneth C, MD  fluticasone (FLONASE) 50 MCG/ACT nasal spray Place 1 spray into both nostrils every morning.  03/02/14   [provider]  folic acid (FOLVITE) 1 MG tablet TAKE 1 TABLET BY MOUTH ONCE DAILY Patient taking differently: Take 1 mg by mouth daily.  09/17/17   Hilty, Lisette AbuKenneth C, MD  Misc Natural Products (OSTEO BI-FLEX ADV TRIPLE ST PO) Take 1 tablet by mouth daily.     [provider]  montelukast (SINGULAIR) 10 MG tablet Take 10 mg by mouth at bedtime.     [provider]  Multiple Vitamin (MULTIVITAMIN WITH MINERALS) TABS tablet Take 1 tablet by mouth daily. Centrum Silver    [provider]  niacin (NIASPAN) 1000 MG CR tablet TAKE 1 TABLET(1000 MG) BY MOUTH AT BEDTIME Patient taking differently: Take 1,000 mg by mouth at bedtime.  12/25/17   Chrystie NoseHilty, Kenneth C, MD  niacin (NIASPAN) 1000 MG CR tablet TAKE 1 TABLET(1000  MG) BY MOUTH AT BEDTIME 01/04/18   Hilty, Lisette AbuKenneth C, MD  nitroGLYCERIN (NITROSTAT) 0.4 MG SL tablet PLACE ONE TABLET UNDER THE TONGUE EVERY 5 MINUTES FOR UP TO 3 DOSES IF NEEDED FOR CHEST PAIN Patient taking differently: Place 0.4 mg under the tongue every 5 (five) minutes as needed for chest pain.  06/07/17   Hilty, Lisette AbuKenneth C, MD  Omega-3 1000 MG CAPS Take 1,000 mg by mouth 2 (two) times daily.    [provider]  omeprazole (PRILOSEC) 20 MG capsule Take 20 mg by mouth daily before breakfast.     [provider]  POLY-IRON 150 150 MG capsule TAKE 1 CAPSULE BY MOUTH ONCE DAILY Patient taking differently: Take 150 mg by mouth daily.  09/25/17   Hilty, Lisette AbuKenneth C, MD  POLY-IRON 150 150 MG capsule TAKE 1 CAPSULE BY MOUTH ONCE DAILY 09/26/17   Hilty, Lisette Abu, MD  polyethylene glycol-electrolytes (NULYTELY/GOLYTELY) 420 g solution Take as directed for colonoscopy prep. 03/27/17   Esterwood, Amy S, PA-C  valsartan-hydrochlorothiazide (DIOVAN-HCT) 160-12.5 MG tablet TAKE 1 TABLET BY MOUTH DAILY 08/02/17   Hilty, Lisette Abu, MD    Family History Family History  Problem Relation Age of Onset  . Stroke Mother   . Cancer Father        lung  . Cancer Sister        lung  . Cancer Sister        liver    Social History Social History   Tobacco Use  . Smoking status: Former Smoker    Last attempt to quit: 03/27/1986    Years since quitting: 31.9  . Smokeless tobacco: Never Used  Substance Use Topics  . Alcohol use: No  . Drug use: No     Allergies   Sulfa antibiotics   Review of Systems Review of Systems  Constitutional: Negative for fever.  Respiratory: Negative for shortness of breath.   Cardiovascular: Negative for chest pain and palpitations.  Gastrointestinal: Negative for vomiting.  Neurological: Negative for dizziness and light-headedness.  All other systems reviewed and are negative.    Physical Exam Updated Vital Signs BP 120/86   Pulse (!) 118   Temp 98.4 F  (36.9 C) (Oral)   Resp (!) 25   SpO2 95%   Physical Exam Vitals signs and nursing note reviewed.  Constitutional:      General: He is not in acute distress.    Appearance: He is well-developed. He is not ill-appearing or diaphoretic.  HENT:     Head: Normocephalic and atraumatic.     Right Ear: External ear normal.     Left Ear: External ear normal.     Nose: Nose normal.  Eyes:     General:        Right eye: No discharge.        Left eye: No discharge.  Neck:     Musculoskeletal: Neck supple.  Cardiovascular:     Rate and Rhythm: Tachycardia present. Rhythm irregular.     Heart sounds: Normal heart sounds.  Pulmonary:     Effort: Pulmonary effort is normal.     Breath sounds: Normal breath sounds.  Abdominal:     Palpations: Abdomen is soft.     Tenderness: There is no abdominal tenderness.  Musculoskeletal:     Right lower leg: No edema.     Left lower leg: No edema.  Skin:    General: Skin is warm and dry.  Neurological:     Mental Status: He is alert.  Psychiatric:        Mood and Affect: Mood is not anxious.      ED Treatments / Results  Labs (all labs ordered are listed, but only abnormal results are displayed) Labs Reviewed  BASIC METABOLIC PANEL - Abnormal; Notable for the following components:      Result Value   Glucose, Bld 120 (*)    All other components within normal limits  CBC  I-STAT TROPONIN, ED    EKG EKG Interpretation  Date/Time:  Monday February 11 2018 14:00:53 EST Ventricular Rate:  101 PR Interval:    QRS Duration: 133 QT Interval:  364 QTC Calculation: 472 R Axis:   -64 Text Interpretation:  Atrial fibrillation Right bundle branch block Anterior infarct, old afib  has replaced sinus rhythm on Jan 29 2018 Confirmed by Pricilla Loveless [phone]) on 02/11/2018 2:25:33 PM   EKG Interpretation  Date/Time:  Monday February 11 2018 15:42:12 EST Ventricular Rate:  77 PR Interval:    QRS Duration: 143 QT Interval:  419 QTC  Calculation: 475 R Axis:   -56 Text Interpretation:  Sinus rhythm Atrial premature complex IVCD, consider atypical RBBB Extensive anterior infarct, old Afib resolved after cardioversion Confirmed by Pricilla Loveless [phone]) on 02/11/2018 3:46:50 PM       Radiology Dg Chest 2 View  Result Date: 02/11/2018 CLINICAL DATA:  Atrial fibrillation, chest pain EXAM: CHEST - 2 VIEW COMPARISON:  01/29/2018 FINDINGS: Previous coronary bypass changes noted. Similar cardiomegaly with central vascular congestion. No focal pneumonia, collapse or consolidation. Negative for edema, large effusion or pneumothorax. Trachea midline. Aorta atherosclerotic. Degenerative changes of the spine. IMPRESSION: Stable cardiomegaly and vascular congestion. Status post coronary bypass. No interval change or acute process by plain radiography Electronically Signed   By: Judie Petit.  Shick M.D.   On: 02/11/2018 15:38    Procedures .Sedation Date/Time: 02/11/2018 3:48 PM Performed by: Pricilla Loveless, MD Authorized by: Pricilla Loveless, MD   Consent:    Consent obtained:  Verbal and written   Consent given by:  Patient   Risks discussed:  Allergic reaction, dysrhythmia, inadequate sedation, nausea, prolonged hypoxia resulting in organ damage, prolonged sedation necessitating reversal, respiratory compromise necessitating ventilatory assistance and intubation and vomiting Universal protocol:    Procedure explained and questions answered to patient or proxy's satisfaction: yes     Relevant documents present and verified: yes     Test results available and properly labeled: yes     Imaging studies available: yes     Required blood products, implants, devices, and special equipment available: yes     Site/side marked: yes     Immediately prior to procedure a time out was called: yes     Patient identity confirmation method:  Verbally with patient Indications:    Procedure performed:  Cardioversion   Procedure necessitating sedation  performed by:  Physician performing sedation Pre-sedation assessment:    Time since last food or drink:  5 hours   ASA classification: class 3 - patient with severe systemic disease     Neck mobility: normal     Mallampati score:  II - soft palate, uvula, fauces visible   Pre-sedation assessments completed and reviewed: airway patency, cardiovascular function, hydration status, mental status, nausea/vomiting, pain level, respiratory function and temperature   Immediate pre-procedure details:    Reassessment: Patient reassessed immediately prior to procedure     Reviewed: vital signs, relevant labs/tests and NPO status     Verified: bag valve mask available, emergency equipment available, intubation equipment available, IV patency confirmed, oxygen available and suction available   Procedure details (see MAR for exact dosages):    Preoxygenation:  Nasal cannula   Sedation:  Propofol   Intra-procedure monitoring:  Blood pressure monitoring, cardiac monitor, continuous pulse oximetry, frequent LOC assessments, frequent vital sign checks and continuous capnometry   Intra-procedure events: none     Total Provider sedation time (minutes):  6 Post-procedure details:    Attendance: Constant attendance by certified staff until patient recovered     Recovery: Patient returned to pre-procedure baseline     Post-sedation assessments completed and reviewed: airway patency, cardiovascular function, hydration status, mental status, nausea/vomiting, pain level, respiratory function and temperature     Patient is stable for discharge or admission: yes  Patient tolerance:  Tolerated well, no immediate complications .Cardioversion Date/Time: 02/11/2018 3:49 PM Performed by: Pricilla Loveless, MD Authorized by: Pricilla Loveless, MD   Consent:    Consent obtained:  Verbal and written   Consent given by:  Patient   Risks discussed:  Cutaneous burn, death, induced arrhythmia and pain Pre-procedure details:     Cardioversion basis:  Emergent   Rhythm:  Atrial fibrillation   Electrode placement:  Anterior-posterior Patient sedated: Yes. Refer to sedation procedure documentation for details of sedation.  Attempt one:    Cardioversion mode:  Synchronous   Waveform:  Biphasic   Shock (Joules):  200   Shock outcome:  Conversion to normal sinus rhythm Post-procedure details:    Patient status:  Awake   Patient tolerance of procedure:  Tolerated well, no immediate complications   (including critical care time)  Medications Ordered in ED Medications  sodium chloride flush (NS) 0.9 % injection 3 mL (has no administration in time range)  propofol (DIPRIVAN) 10 mg/mL bolus/IV push 63.3 mg (63.3 mg Intravenous Not Given 02/11/18 1546)  sodium chloride 0.9 % bolus 500 mL (500 mLs Intravenous New Bag/Given 02/11/18 1448)  propofol (DIPRIVAN) 10 mg/mL bolus/IV push (60 mg Intravenous Given 02/11/18 1539)     Initial Impression / Assessment and Plan / ED Course  I have reviewed the triage vital signs and the nursing notes.  Pertinent labs & imaging results that were available during my care of the patient were reviewed by me and considered in my medical decision making (see chart for details).     Patient presents with recurrent atrial fibrillation.  Given this occurred only just a couple hours ago and he is currently on the Eliquis and this appears to be paroxysmal, I think is reasonable to cardiovert him.  I discussed with Dr. Elease Hashimoto, who agrees and while he is due for possible ablation when meeting with Dr. Johney Frame later, it was to be most beneficial to cardiovert him now and then get him to follow-up outpatient.  This was performed as above without obvious complication.  He will be p.o. challenged and if no difficulties, then discharged home.  CHA2DS2/VAS Stroke Risk Points  Current as of 38 minutes ago     5 >= 2 Points: High Risk  1 - 1.99 Points: Medium Risk  0 Points: Low Risk    The previous  score was 4 on 05/31/2017.:  Last Change:     Details    This score determines the patient's risk of having a stroke if the  patient has atrial fibrillation.       Points Metrics  1 Has Congestive Heart Failure:  Yes    Current as of 38 minutes ago  1 Has Vascular Disease:  Yes    Current as of 38 minutes ago  1 Has Hypertension:  Yes    Current as of 38 minutes ago  2 Age:  1    Current as of 38 minutes ago  0 Has Diabetes:  No    Current as of 38 minutes ago  0 Had Stroke:  No  Had TIA:  No  Had thromboembolism:  No    Current as of 38 minutes ago  0 Male:  No    Current as of 38 minutes ago             Final Clinical Impressions(s) / ED Diagnoses   Final diagnoses:  Paroxysmal atrial fibrillation [facility])    ED Discharge Orders  None       Pricilla LovelessGoldston, Ellamarie Naeve, MD 02/11/18 [phone]

## 2018-02-11 NOTE — ED Provider Notes (Signed)
Assumed care from Dr. Gwenlyn Fudge at 4 PM.  Patient status post cardioversion for paroxysmal A. fib.  Awaiting metabolization of propofol and then okay for discharge home.  Patient reevaluated is at his baseline.  Has had p.o. without any issues.  Safe for discharge.  Recommend follow-up with cardiology which is already in place.  This chart was dictated using voice recognition software.  Despite best efforts to proofread,  errors can occur which can change the documentation meaning.    Virgina Norfolk, DO 02/11/18 1651

## 2018-02-11 NOTE — ED Notes (Signed)
Pt returned from Xray.  No distress noted.

## 2018-02-12 ENCOUNTER — Telehealth: Payer: Self-pay | Admitting: Internal Medicine

## 2018-02-12 NOTE — Telephone Encounter (Signed)
New message     Pt stated that he was in ER yesterday and was advised to make Dr. Johney Frame aware and to see if he needs to be seen before 1/24. Please follow up

## 2018-02-12 NOTE — Telephone Encounter (Signed)
Returned call to patient who states that he was in the ER yesterday and required DCCV. Patient wanting to make Dr. Johney Frame aware prior to his appointment on Friday 1/24. Patient asymptomatic at this time and will let us know if his Sx change or worsen before his appointment.

## 2018-02-15 ENCOUNTER — Ambulatory Visit: Payer: Medicare Other | Admitting: Internal Medicine

## 2018-02-15 ENCOUNTER — Encounter: Payer: Self-pay | Admitting: Internal Medicine

## 2018-02-15 ENCOUNTER — Encounter: Payer: Self-pay | Admitting: *Deleted

## 2018-02-15 VITALS — BP 126/70 | HR 66 | Ht 74.5 in | Wt 279.8 lb

## 2018-02-15 DIAGNOSIS — I255 Ischemic cardiomyopathy: Secondary | ICD-10-CM

## 2018-02-15 DIAGNOSIS — I10 Essential (primary) hypertension: Secondary | ICD-10-CM | POA: Diagnosis not present

## 2018-02-15 DIAGNOSIS — I480 Paroxysmal atrial fibrillation: Secondary | ICD-10-CM

## 2018-02-15 LAB — BASIC METABOLIC PANEL
BUN/Creatinine Ratio: 12 (ref 10–24)
BUN: 12 mg/dL (ref 8–27)
CHLORIDE: 101 mmol/L (ref 96–106)
CO2: 23 mmol/L (ref 20–29)
Calcium: 9.8 mg/dL (ref 8.6–10.2)
Creatinine, Ser: 0.99 mg/dL (ref 0.76–1.27)
GFR, EST AFRICAN AMERICAN: 86 mL/min/{1.73_m2} (ref >59)
GFR, EST NON AFRICAN AMERICAN: 74 mL/min/{1.73_m2} (ref >59)
Glucose: 96 mg/dL (ref 65–99)
POTASSIUM: 4.5 mmol/L (ref 3.5–5.2)
Sodium: 141 mmol/L (ref 134–144)

## 2018-02-15 LAB — CBC
Hematocrit: 45.5 % (ref 37.5–51.0)
Hemoglobin: 15.5 g/dL (ref 13.0–17.7)
MCH: 30.5 pg (ref 26.6–33.0)
MCHC: 34.1 g/dL (ref 31.5–35.7)
MCV: 90 fL (ref 79–97)
Platelets: 194 10*3/uL (ref 150–450)
RBC: 5.08 x10E6/uL (ref 4.14–5.80)
RDW: 13.2 % (ref 11.6–15.4)
WBC: 8.7 10*3/uL (ref 3.4–10.8)

## 2018-02-15 NOTE — Progress Notes (Signed)
Electrophysiology Office Note Date: 02/15/2018  ID:  Nicholas Lynn, Nicholas Lynn 1945/04/08, MRN [id number]  PCP: Tracey Harries, MD Primary Cardiologist: Dr Rennis Golden Electrophysiologist: Dr Johney Frame  CC: Evaluation of atrial fibrillation  Nicholas Lynn is a 73 y.o. male seen today at the request of Dr Rennis Golden for the evaluation of atrial fibrillation. He has a PMH of CAD s/p PCI and CABG, HLD, HFrEF, HTN, OSA, and COPD. He was diagnosed with atrial fibrillation after his redo bypass in 2013. He was treated initially with warfarin and amiodarone but these were discontinued. In 2014, he had recurrence of afib with rapid rates which required DCCV. He has been maintained on amiodarone, metoprolol, and Eliquis for several years. Eight months ago, his amiodarone was stopped 2/2 worsening PFTs. Since then, he has had two visits to the ER with atrial fibrillation with RVR. He reports symptoms of heart facing and flushing in his face. He denies alcohol use. He is compliant with his CPAP. He is referred for evaluation alternated AAD vs ablation.  He denies chest pain, dyspnea, PND, orthopnea, nausea, vomiting, dizziness, syncope, edema, weight gain, or early satiety.  Past Medical History:  Diagnosis Date  . A-fib (HCC)   . Arthritis   . COPD (chronic obstructive pulmonary disease) (HCC)    mild on singulair  . Coronary artery disease 1998; 1996; 2013   PTCA LCX; CABG X 4; RE-do CABG  . GERD (gastroesophageal reflux disease)   . Hernia    umbilical  . Hx of myocardial infarction 1983   medically treated   . Hyperlipidemia   . Hypertension   . OSA on CPAP    Past Surgical History:  Procedure Laterality Date  . ANGIOPLASTY  1993   LCX  . CARDIAC CATHETERIZATION  2008;2013  . CARDIOVERSION N/A 12/25/2012   Procedure: CARDIOVERSION;  Surgeon: Chrystie Nose, MD;  Location: [facility] ENDOSCOPY;  Service: Cardiovascular;  Laterality: N/A;  . CATARACT EXTRACTION    . COLONOSCOPY WITH PROPOFOL N/A 05/03/2017     Procedure: COLONOSCOPY WITH PROPOFOL;  Surgeon: Rachael Fee, MD;  Location: [facility] ENDOSCOPY;  Service: Endoscopy;  Laterality: N/A;  . CORONARY ARTERY BYPASS GRAFT  1996   3 VESSELS  . CORONARY ARTERY BYPASS GRAFT  09/11/2011   Procedure: REDO CORONARY ARTERY BYPASS GRAFTING (CABG);  Surgeon: Delight Ovens, MD;  Location: [facility] OR;  Service: Open Heart Surgery;  Laterality: N/A;  Redo Coronary Artery Bypass Graft times three utilizing the right internal mammary artery and the left  and right greater saphenous veins.  Marland Kitchen GRAFT(S) ANGIOGRAM  09/01/2011   Procedure: GRAFT(S) Rosalin Hawking;  Surgeon: Marykay Lex, MD;  Location: [facility] CATH LAB;  Service: Cardiovascular;;  . LEFT HEART CATHETERIZATION WITH CORONARY ANGIOGRAM N/A 09/01/2011   Procedure: LEFT HEART CATHETERIZATION WITH CORONARY ANGIOGRAM;  Surgeon: Marykay Lex, MD;  Location: [facility] CATH LAB;  Service: Cardiovascular;  Laterality: N/A;  . TEE WITHOUT CARDIOVERSION  09/11/2011   Procedure: TRANSESOPHAGEAL ECHOCARDIOGRAM (TEE);  Surgeon: Delight Ovens, MD;  Location: [facility] OR;  Service: Open Heart Surgery;  Laterality: N/A;  . TEE WITHOUT CARDIOVERSION N/A 12/25/2012   Procedure: TRANSESOPHAGEAL ECHOCARDIOGRAM (TEE);  Surgeon: Chrystie Nose, MD;  Location: [facility] ENDOSCOPY;  Service: Cardiovascular;  Laterality: N/A;  . UMBILICAL HERNIA REPAIR  04/04/2011   Procedure: HERNIA REPAIR UMBILICAL ADULT;  Surgeon: Adolph Pollack, MD;  Location: [facility] ORS;  Service: General;  Laterality: N/A;  Umbilical Hernia Repair    Current Outpatient Medications  Medication Sig Dispense Refill  . acetaminophen (TYLENOL) 500 MG tablet Take 1,000 mg by mouth every 6 (six) hours as needed (for pain).     Marland Kitchen aspirin EC 81 MG tablet Take 81 mg by mouth daily.    Marland Kitchen atorvastatin (LIPITOR) 10 MG tablet Take one tablet daily 90 tablet 3  . Bioflavonoid Products (ESTER C PO) Take 500 mg by mouth daily.      . carvedilol (COREG) 3.125 MG tablet Take 1 tablet (3.125 mg  total) by mouth 2 (two) times daily. 180 tablet 3  . cetirizine (ZYRTEC) 10 MG tablet Take 10 mg by mouth at bedtime.     . Cholecalciferol (D3-1000 PO) Take 1,000 Units by mouth 2 (two) times daily.    Marland Kitchen ELIQUIS 5 MG TABS tablet TAKE 1 TABLET BY MOUTH TWICE A DAY 180 tablet 1  . ezetimibe (ZETIA) 10 MG tablet Take 1 tablet (10 mg total) by mouth daily. 30 tablet 11  . fluticasone (FLONASE) 50 MCG/ACT nasal spray Place 1 spray into both nostrils every morning.   1  . folic acid (FOLVITE) 1 MG tablet TAKE 1 TABLET BY MOUTH ONCE DAILY 30 tablet 9  . Misc Natural Products (OSTEO BI-FLEX ADV TRIPLE ST PO) Take 1 tablet by mouth daily with breakfast.     . montelukast (SINGULAIR) 10 MG tablet Take 10 mg by mouth at bedtime.     . Multiple Vitamins-Minerals (CENTRUM SILVER 50+MEN) TABS Take 1 tablet by mouth daily with breakfast.    . niacin (NIASPAN) 1000 MG CR tablet TAKE 1 TABLET(1000 MG) BY MOUTH AT BEDTIME 90 tablet 3  . nitroGLYCERIN (NITROSTAT) 0.4 MG SL tablet PLACE ONE TABLET UNDER THE TONGUE EVERY 5 MINUTES FOR UP TO 3 DOSES IF NEEDED FOR CHEST PAIN 25 tablet 3  . Omega-3 1000 MG CAPS Take 1,000 mg by mouth 2 (two) times daily.    Marland Kitchen omeprazole (PRILOSEC) 20 MG capsule Take 20 mg by mouth daily before breakfast.     . POLY-IRON 150 150 MG capsule TAKE 1 CAPSULE BY MOUTH ONCE DAILY 30 capsule 0  . valsartan-hydrochlorothiazide (DIOVAN-HCT) 160-12.5 MG tablet TAKE 1 TABLET BY MOUTH DAILY 90 tablet 3   No current facility-administered medications for this visit.     Allergies:   Sulfa antibiotics   Social History: Social History   Socioeconomic History  . Marital status: Married    Spouse name: Not on file  . Number of children: Not on file  . Years of education: Not on file  . Highest education level: Not on file  Occupational History  . Not on file  Social Needs  . Financial resource strain: Not on file  . Food insecurity:    Worry: Not on file    Inability: Not on file  .  Transportation needs:    Medical: Not on file    Non-medical: Not on file  Tobacco Use  . Smoking status: Former Smoker    Last attempt to quit: 03/27/1986    Years since quitting: 31.9  . Smokeless tobacco: Never Used  Substance and Sexual Activity  . Alcohol use: No  . Drug use: No  . Sexual activity: Not on file  Lifestyle  . Physical activity:    Days per week: Not on file    Minutes per session: Not on file  . Stress: Not on file  Relationships  . Social connections:    Talks on phone: Not on file    Gets together:  [place] or afib including Tikosyn and ablation were discussed in detail with the patient today. Risk, benefits, and alterna<BADT XTTAG>NitaMarland Kitchen Sickleand Kit04.5Marland Kitchenhe18m SClaude 04.5anSSouth Carolinaouth [place]ag sMa w ll CaNiNitaMarland Kitchen Sicklehen Sic04.5leClaude 62maNClaude MangeMaxwell Caulcklegesncy  ablation for afib were also discussed in detail today. These risks  include but are not limited to stroke, bleeding, vascular damage, tamponade, perforation, damage to the esophagus, lungs, and other structures, pulmonary vein stenosis, worsening renal function, and death. The patient understands these risk and wishes to proceed with ablation.  If he should go into atrial fibrillation prior to his procedure, we encouraged him to reach out to the Afib Clinic unless he was having unstable symptoms such as chest pain, presyncope, syncope, or extreme SOB.  Continue Eliquis 5 mg BID for CHADS2VASC score of at least 4.  2. HTN Stable, no changes today  3. OSA Compliant with CPAP therapy.  4. CAD S/p bypass surgery in 1996 and 2013. No anginal symptoms.  5. Ischemic cardiomyopathy Last EF 40-45%. No overt signs of fluid overload. Continue ARB and BB.  Current medicines are reviewed at length with the patient today.   The patient does not have concerns regarding his medicines.  The following changes were made today:  none  Labs/ tests ordered today include:  Orders Placed This Encounter  Procedures  . Basic metabolic panel  . CBC  . EKG 12-Lead     Disposition:   Follow up with Afib clinic and Dr Johney Frame post procedure. Follow up with Dr Rennis Golden as scheduled.   Randolm Idol MD 02/15/2018 1:04 PM   [facility] [address] [place] [address] [phone] (office) [phone] (fax)

## 2018-02-15 NOTE — Patient Instructions (Addendum)
Medication Instructions:  Your physician recommends that you continue on your current medications as directed. Please refer to the Current Medication list given to you today.  Labwork: You will get lab work today:  BMP and CBC.  Testing/Procedures: Your physician has recommended that you have an ablation. Catheter ablation is a medical procedure used to treat some cardiac arrhythmias (irregular heartbeats). During catheter ablation, a long, thin, flexible tube is put into a blood vessel in your groin (upper thigh), or neck. This tube is called an ablation catheter. It is then guided to your heart through the blood vessel. Radio frequency waves destroy small areas of heart tissue where abnormal heartbeats may cause an arrhythmia to start. Please see the instruction sheet given to you today:  Do not eat or drink after midnight prior to procedure Do not take any medications the morning of the procedure Plan for one night stay You will need someone to drive you home at discharge This procedure is scheduled to start at 10:30 am - you will go from the TEE to Short Stay for the ablation.  Follow-Up: You will follow up with afib clinic 4 weeks affter your procedure.  You will follow up with Dr. Johney Frame 91 days after your procedure.  Any Other Special Instructions Will Be Listed Below (If Applicable). Your physician has requested that you have a TEE. During a TEE, sound waves are used to create images of your heart. It provides your doctor with information about the size and shape of your heart and how well your heart's chambers and valves are working. In this test, a transducer is attached to the end of a flexible tube that's guided down your throat and into your esophagus (the tube leading from you mouth to your stomach) to get a more detailed image of your heart. You are not awake for the procedure. Please see the instruction sheet given to you today. For further information please visit  [email or web address]      If you need a refill on your cardiac medications before your next appointment, please call your pharmacy.

## 2018-02-18 ENCOUNTER — Telehealth: Payer: Self-pay | Admitting: Internal Medicine

## 2018-02-18 NOTE — Telephone Encounter (Signed)
Please advise. Thank you

## 2018-02-18 NOTE — Telephone Encounter (Signed)
  Patient is requesting to have Dr Rennis Golden to write him a new prescription for his CPAP supplies. He stated that he can come by and pick up the script when it is ready.

## 2018-02-18 NOTE — Telephone Encounter (Signed)
Thanks for the [employer].  DR. Rexene Edison

## 2018-02-18 NOTE — Telephone Encounter (Signed)
Returned a call to patient. He states that his insurance company says that he "Needs to get a new CPAP machine because his is too old." I explained to the patient that before this can happen he needs to have office appointment with Dr Tresa Endo and possibly a new sleep study since he states the original study was ordered by Dr Alanda Amass.10 years ago. He also currently doesn't have a DME company managing his CPAP or supplies. Offered patient appointment and he declined stating he does not want to make appointment right now. " I have too much going on. I'm getting an ablation in a couple of weeks.." I explained to the patient that his heart could possibly be out of rhythm if his current CPAP machine is not set properly to control his OSA. Patient states that he understands, however he does not want to make an appointment right now. Patient states he will call back to schedule appointment to see Dr Tresa Endo for follow up of his OSA at another time.

## 2018-02-19 ENCOUNTER — Telehealth: Payer: Self-pay | Admitting: Cardiovascular Disease

## 2018-02-19 NOTE — Telephone Encounter (Signed)
New Message         Patient is having an ablation on 2018/03/05. We made a appt on  03/08/2018 with Dr. Tresa Endo for a ? Sleep study and to get a new c-pap machine, will that be to early for the patient to come in after his ablation? Pls call and advise.

## 2018-02-19 NOTE — Telephone Encounter (Signed)
agree

## 2018-02-19 NOTE — Telephone Encounter (Signed)
Returned call to patient's wife.She stated husband is scheduled to have ablation 2/6 with Dr.Allred.Stated Dr.Allred wanted patient to see Dr.Kelly to evaluate for a new cpap machine.She wanted to make sure this appointment was not too soon after ablation.Spoke with Raynelle Fanning Dr.Kelly's nurse she spoke to [facility] he advised keep appointment as planned.

## 2018-02-28 ENCOUNTER — Other Ambulatory Visit (HOSPITAL_COMMUNITY): Payer: Medicare Other

## 2018-02-28 ENCOUNTER — Ambulatory Visit (HOSPITAL_COMMUNITY): Admission: RE | Admit: 2018-02-28 | Payer: Medicare Other | Source: Home / Self Care | Admitting: Cardiology

## 2018-02-28 SURGERY — ATRIAL FIBRILLATION ABLATION
Anesthesia: General

## 2018-02-28 SURGERY — ECHOCARDIOGRAM, TRANSESOPHAGEAL
Anesthesia: Moderate Sedation

## 2018-03-05 ENCOUNTER — Telehealth: Payer: Self-pay | Admitting: Cardiovascular Disease

## 2018-03-05 NOTE — Telephone Encounter (Signed)
FYI

## 2018-03-05 NOTE — Telephone Encounter (Signed)
New Message:       Wife called and wanted Dr Tresa Endo and Dr Rennis Golden to know pt passed away on last Thursday(2018/03/24),

## 2018-03-06 NOTE — Telephone Encounter (Signed)
Thanks for letting me know!

## 2018-03-08 ENCOUNTER — Ambulatory Visit: Payer: Medicare Other | Admitting: Cardiovascular Disease

## 2018-03-24 DEATH — deceased

## 2018-03-28 ENCOUNTER — Ambulatory Visit (HOSPITAL_COMMUNITY): Payer: Medicare Other | Admitting: Nurse Practitioner

## 2018-06-05 ENCOUNTER — Ambulatory Visit: Payer: Medicare Other | Admitting: Internal Medicine
